# Patient Record
Sex: Male | Born: 1950
Health system: Southern US, Community
[De-identification: ages and names within clinical notes are randomized; demographics above are authoritative.]

## PROBLEM LIST (undated history)

## (undated) DIAGNOSIS — Z8489 Family history of other specified conditions: Secondary | ICD-10-CM

## (undated) DIAGNOSIS — R0600 Dyspnea, unspecified: Secondary | ICD-10-CM

## (undated) DIAGNOSIS — Z9889 Other specified postprocedural states: Secondary | ICD-10-CM

## (undated) DIAGNOSIS — F1090 Alcohol use, unspecified, uncomplicated: Secondary | ICD-10-CM

## (undated) DIAGNOSIS — G4730 Sleep apnea, unspecified: Secondary | ICD-10-CM

## (undated) DIAGNOSIS — J302 Other seasonal allergic rhinitis: Secondary | ICD-10-CM

## (undated) DIAGNOSIS — J45909 Unspecified asthma, uncomplicated: Secondary | ICD-10-CM

## (undated) DIAGNOSIS — M1990 Unspecified osteoarthritis, unspecified site: Secondary | ICD-10-CM

## (undated) DIAGNOSIS — K219 Gastro-esophageal reflux disease without esophagitis: Secondary | ICD-10-CM

## (undated) DIAGNOSIS — J449 Chronic obstructive pulmonary disease, unspecified: Secondary | ICD-10-CM

## (undated) DIAGNOSIS — F419 Anxiety disorder, unspecified: Secondary | ICD-10-CM

## (undated) DIAGNOSIS — Z789 Other specified health status: Secondary | ICD-10-CM

## (undated) DIAGNOSIS — R112 Nausea with vomiting, unspecified: Secondary | ICD-10-CM

## (undated) DIAGNOSIS — J42 Unspecified chronic bronchitis: Secondary | ICD-10-CM

## (undated) DIAGNOSIS — I10 Essential (primary) hypertension: Secondary | ICD-10-CM

## (undated) DIAGNOSIS — T7840XA Allergy, unspecified, initial encounter: Secondary | ICD-10-CM

## (undated) DIAGNOSIS — Z7289 Other problems related to lifestyle: Secondary | ICD-10-CM

## (undated) DIAGNOSIS — J189 Pneumonia, unspecified organism: Secondary | ICD-10-CM

## (undated) DIAGNOSIS — Z8719 Personal history of other diseases of the digestive system: Secondary | ICD-10-CM

## (undated) HISTORY — DX: Allergy, unspecified, initial encounter: T78.40XA

## (undated) HISTORY — PX: NASAL POLYP EXCISION: SHX2068

## (undated) HISTORY — PX: COLONOSCOPY: SHX174

## (undated) HISTORY — PX: TONSILLECTOMY: SUR1361

## (undated) HISTORY — PX: BACK SURGERY: SHX140

## (undated) HISTORY — PX: ANTERIOR CERVICAL DECOMP/DISCECTOMY FUSION: SHX1161

## (undated) HISTORY — PX: LUMBAR DISC SURGERY: SHX700

## (undated) HISTORY — PX: LAPAROSCOPIC CHOLECYSTECTOMY: SUR755

## (undated) HISTORY — PX: JOINT REPLACEMENT: SHX530

## (undated) HISTORY — PX: SPINE SURGERY: SHX786

---

## 1998-06-30 ENCOUNTER — Inpatient Hospital Stay (HOSPITAL_COMMUNITY): Admission: RE | Admit: 1998-06-30 | Discharge: 1998-07-01 | Payer: Self-pay | Admitting: Neurosurgery

## 2003-02-23 ENCOUNTER — Emergency Department (HOSPITAL_COMMUNITY): Admission: EM | Admit: 2003-02-23 | Discharge: 2003-02-23 | Payer: Self-pay | Admitting: Emergency Medicine

## 2004-09-14 ENCOUNTER — Ambulatory Visit (HOSPITAL_COMMUNITY): Admission: RE | Admit: 2004-09-14 | Discharge: 2004-09-14 | Payer: Self-pay | Admitting: General Surgery

## 2005-04-08 ENCOUNTER — Ambulatory Visit (HOSPITAL_COMMUNITY): Admission: RE | Admit: 2005-04-08 | Discharge: 2005-04-08 | Payer: Self-pay | Admitting: Internal Medicine

## 2007-03-27 ENCOUNTER — Ambulatory Visit (HOSPITAL_COMMUNITY): Admission: RE | Admit: 2007-03-27 | Discharge: 2007-03-27 | Payer: Self-pay | Admitting: Family Medicine

## 2009-12-22 ENCOUNTER — Ambulatory Visit (HOSPITAL_COMMUNITY): Admission: RE | Admit: 2009-12-22 | Discharge: 2009-12-22 | Payer: Self-pay | Admitting: Family Medicine

## 2011-04-19 NOTE — H&P (Signed)
Jeremy [name], HORSEY NO.:  [id number]   MEDICAL RECORD NO.:  [id number]           PATIENT TYPE:   LOCATION:                                 FACILITY:   PHYSICIAN:  Dalia Heading, M.D.  DATE OF BIRTH:  22-May-1951   DATE OF ADMISSION:  DATE OF DISCHARGE:  LH                                HISTORY & PHYSICAL   CHIEF COMPLAINT:  Need for screening colonoscopy, hematochezia.   HISTORY OF PRESENT ILLNESS:  The patient is a 60 year old white male who is  referred for a screening colonoscopy.  He denies any fevers, chills, weight  loss, abdominal pain, melena, constipation, or diarrhea.  He did have an  episode of hematochezia in the past.  There is no history of hemorrhoidal  disease.  He has never had a colonoscopy.  There is no family history of  colon carcinoma.   PAST MEDICAL HISTORY:  Hypertension.   PAST SURGICAL HISTORY:  1.  Back surgeries.  2.  Cholecystectomy.   CURRENT MEDICATIONS:  1.  Norvasc 10 mg p.o. daily.  2.  Toprol-XL 25 mg p.o. daily.  3.  Prilosec 20 mg p.o. daily.  4.  Cipro 500 mg p.o. b.i.d. x14 days.  5.  Sonata 10 mg p.o. q.h.s.   ALLERGIES:  PENICILLIN.   REVIEW OF SYSTEMS:  The patient does smoke.  He does drink alcohol on  occasion.   PHYSICAL EXAMINATION:  GENERAL:  The patient is a well-developed, well-  nourished white male in no acute distress.  VITAL SIGNS:  He is afebrile, and vital signs are stable.  LUNGS:  Clear to auscultation with equal breath sounds bilaterally.  HEART:  Regular rate and rhythm without S3, S4, or murmurs.  ABDOMEN:  Soft, nontender, nondistended.  No hepatosplenomegaly or masses  are noted.  Rectal examination was deferred to the procedure.   IMPRESSION:  Need for screening colonoscopy, hematochezia.   PLAN:  The patient is scheduled for a colonoscopy on September 14, 2004.  The  risks and benefits of the procedure, including bleeding and perforation,  were fully explained to the patient who  gave informed consent.       ___________________________________________  Dalia Heading, M.D.    MAJ/MEDQ  D:  08/22/2004  T:  08/22/2004  Job:  [id number]   cc:   Patrica Duel, M.D.  [address]  [place]  [place] [address]  Fax: [phone]

## 2014-07-06 ENCOUNTER — Other Ambulatory Visit (HOSPITAL_COMMUNITY): Payer: Self-pay | Admitting: Family Medicine

## 2014-07-06 ENCOUNTER — Ambulatory Visit (HOSPITAL_COMMUNITY)
Admission: RE | Admit: 2014-07-06 | Discharge: 2014-07-06 | Disposition: A | Discharge status: Home / Self Care | Payer: BC Managed Care – PPO | Source: Ambulatory Visit | Attending: Family Medicine | Admitting: Family Medicine

## 2014-07-06 DIAGNOSIS — M47812 Spondylosis without myelopathy or radiculopathy, cervical region: Secondary | ICD-10-CM | POA: Diagnosis not present

## 2014-07-06 DIAGNOSIS — M5030 Other cervical disc degeneration, unspecified cervical region: Secondary | ICD-10-CM

## 2014-07-06 DIAGNOSIS — M542 Cervicalgia: Secondary | ICD-10-CM | POA: Insufficient documentation

## 2014-07-06 DIAGNOSIS — R079 Chest pain, unspecified: Secondary | ICD-10-CM

## 2015-04-03 ENCOUNTER — Other Ambulatory Visit (HOSPITAL_COMMUNITY): Payer: Self-pay | Admitting: Family Medicine

## 2015-04-03 DIAGNOSIS — M5030 Other cervical disc degeneration, unspecified cervical region: Secondary | ICD-10-CM

## 2015-04-11 ENCOUNTER — Ambulatory Visit (HOSPITAL_COMMUNITY)
Admission: RE | Admit: 2015-04-11 | Discharge: 2015-04-11 | Disposition: A | Discharge status: Home / Self Care | Payer: Medicare Other | Source: Ambulatory Visit | Attending: Family Medicine | Admitting: Family Medicine

## 2015-04-11 DIAGNOSIS — M5030 Other cervical disc degeneration, unspecified cervical region: Secondary | ICD-10-CM

## 2015-12-12 DIAGNOSIS — J019 Acute sinusitis, unspecified: Secondary | ICD-10-CM | POA: Diagnosis not present

## 2015-12-12 DIAGNOSIS — I10 Essential (primary) hypertension: Secondary | ICD-10-CM | POA: Diagnosis not present

## 2015-12-12 DIAGNOSIS — Z6825 Body mass index (BMI) 25.0-25.9, adult: Secondary | ICD-10-CM | POA: Diagnosis not present

## 2015-12-12 DIAGNOSIS — Z1389 Encounter for screening for other disorder: Secondary | ICD-10-CM | POA: Diagnosis not present

## 2015-12-12 DIAGNOSIS — J209 Acute bronchitis, unspecified: Secondary | ICD-10-CM | POA: Diagnosis not present

## 2016-01-30 DIAGNOSIS — M5020 Other cervical disc displacement, unspecified cervical region: Secondary | ICD-10-CM | POA: Diagnosis not present

## 2016-04-23 DIAGNOSIS — M5030 Other cervical disc degeneration, unspecified cervical region: Secondary | ICD-10-CM | POA: Diagnosis not present

## 2016-04-23 DIAGNOSIS — Z0000 Encounter for general adult medical examination without abnormal findings: Secondary | ICD-10-CM | POA: Diagnosis not present

## 2016-04-23 DIAGNOSIS — Z6824 Body mass index (BMI) 24.0-24.9, adult: Secondary | ICD-10-CM | POA: Diagnosis not present

## 2016-04-23 DIAGNOSIS — Z1389 Encounter for screening for other disorder: Secondary | ICD-10-CM | POA: Diagnosis not present

## 2016-04-23 DIAGNOSIS — G894 Chronic pain syndrome: Secondary | ICD-10-CM | POA: Diagnosis not present

## 2016-04-23 DIAGNOSIS — I10 Essential (primary) hypertension: Secondary | ICD-10-CM | POA: Diagnosis not present

## 2016-04-24 DIAGNOSIS — E781 Pure hyperglyceridemia: Secondary | ICD-10-CM | POA: Diagnosis not present

## 2016-04-24 DIAGNOSIS — Z1389 Encounter for screening for other disorder: Secondary | ICD-10-CM | POA: Diagnosis not present

## 2016-04-24 DIAGNOSIS — Z6824 Body mass index (BMI) 24.0-24.9, adult: Secondary | ICD-10-CM | POA: Diagnosis not present

## 2016-04-24 DIAGNOSIS — I10 Essential (primary) hypertension: Secondary | ICD-10-CM | POA: Diagnosis not present

## 2016-04-24 DIAGNOSIS — Z0000 Encounter for general adult medical examination without abnormal findings: Secondary | ICD-10-CM | POA: Diagnosis not present

## 2016-04-25 DIAGNOSIS — D225 Melanocytic nevi of trunk: Secondary | ICD-10-CM | POA: Diagnosis not present

## 2016-04-25 DIAGNOSIS — D485 Neoplasm of uncertain behavior of skin: Secondary | ICD-10-CM | POA: Diagnosis not present

## 2016-04-25 DIAGNOSIS — L821 Other seborrheic keratosis: Secondary | ICD-10-CM | POA: Diagnosis not present

## 2016-04-25 DIAGNOSIS — D2261 Melanocytic nevi of right upper limb, including shoulder: Secondary | ICD-10-CM | POA: Diagnosis not present

## 2016-04-25 DIAGNOSIS — D2262 Melanocytic nevi of left upper limb, including shoulder: Secondary | ICD-10-CM | POA: Diagnosis not present

## 2016-04-30 DIAGNOSIS — M5020 Other cervical disc displacement, unspecified cervical region: Secondary | ICD-10-CM | POA: Diagnosis not present

## 2016-05-09 DIAGNOSIS — D485 Neoplasm of uncertain behavior of skin: Secondary | ICD-10-CM | POA: Diagnosis not present

## 2016-05-09 DIAGNOSIS — L089 Local infection of the skin and subcutaneous tissue, unspecified: Secondary | ICD-10-CM | POA: Diagnosis not present

## 2016-05-23 ENCOUNTER — Encounter (INDEPENDENT_AMBULATORY_CARE_PROVIDER_SITE_OTHER): Payer: Self-pay | Admitting: *Deleted

## 2016-08-01 DIAGNOSIS — M5020 Other cervical disc displacement, unspecified cervical region: Secondary | ICD-10-CM | POA: Diagnosis not present

## 2016-09-10 DIAGNOSIS — G894 Chronic pain syndrome: Secondary | ICD-10-CM | POA: Diagnosis not present

## 2016-09-10 DIAGNOSIS — J9801 Acute bronchospasm: Secondary | ICD-10-CM | POA: Diagnosis not present

## 2016-09-10 DIAGNOSIS — I10 Essential (primary) hypertension: Secondary | ICD-10-CM | POA: Diagnosis not present

## 2016-09-10 DIAGNOSIS — Z6824 Body mass index (BMI) 24.0-24.9, adult: Secondary | ICD-10-CM | POA: Diagnosis not present

## 2016-09-10 DIAGNOSIS — Z1389 Encounter for screening for other disorder: Secondary | ICD-10-CM | POA: Diagnosis not present

## 2016-09-13 DIAGNOSIS — R69 Illness, unspecified: Secondary | ICD-10-CM | POA: Diagnosis not present

## 2016-09-17 DIAGNOSIS — Z1211 Encounter for screening for malignant neoplasm of colon: Secondary | ICD-10-CM | POA: Diagnosis not present

## 2017-01-14 DIAGNOSIS — M5020 Other cervical disc displacement, unspecified cervical region: Secondary | ICD-10-CM | POA: Diagnosis not present

## 2017-05-27 DIAGNOSIS — Z0001 Encounter for general adult medical examination with abnormal findings: Secondary | ICD-10-CM | POA: Diagnosis not present

## 2017-05-27 DIAGNOSIS — G894 Chronic pain syndrome: Secondary | ICD-10-CM | POA: Diagnosis not present

## 2017-05-27 DIAGNOSIS — M1991 Primary osteoarthritis, unspecified site: Secondary | ICD-10-CM | POA: Diagnosis not present

## 2017-05-27 DIAGNOSIS — Z1389 Encounter for screening for other disorder: Secondary | ICD-10-CM | POA: Diagnosis not present

## 2017-05-27 DIAGNOSIS — Z6825 Body mass index (BMI) 25.0-25.9, adult: Secondary | ICD-10-CM | POA: Diagnosis not present

## 2017-05-27 DIAGNOSIS — E663 Overweight: Secondary | ICD-10-CM | POA: Diagnosis not present

## 2017-05-27 DIAGNOSIS — I10 Essential (primary) hypertension: Secondary | ICD-10-CM | POA: Diagnosis not present

## 2017-08-07 ENCOUNTER — Emergency Department (HOSPITAL_COMMUNITY)
Admission: EM | Admit: 2017-08-07 | Discharge: 2017-08-07 | Disposition: A | Discharge status: Home / Self Care | Payer: Medicare HMO | Attending: Emergency Medicine | Admitting: Emergency Medicine

## 2017-08-07 ENCOUNTER — Emergency Department (HOSPITAL_COMMUNITY): Payer: Medicare HMO

## 2017-08-07 ENCOUNTER — Encounter (HOSPITAL_COMMUNITY): Payer: Self-pay | Admitting: Emergency Medicine

## 2017-08-07 DIAGNOSIS — R1010 Upper abdominal pain, unspecified: Secondary | ICD-10-CM | POA: Diagnosis present

## 2017-08-07 DIAGNOSIS — F17290 Nicotine dependence, other tobacco product, uncomplicated: Secondary | ICD-10-CM | POA: Diagnosis not present

## 2017-08-07 DIAGNOSIS — R0602 Shortness of breath: Secondary | ICD-10-CM | POA: Diagnosis not present

## 2017-08-07 DIAGNOSIS — R109 Unspecified abdominal pain: Secondary | ICD-10-CM | POA: Diagnosis not present

## 2017-08-07 DIAGNOSIS — R1011 Right upper quadrant pain: Secondary | ICD-10-CM | POA: Diagnosis not present

## 2017-08-07 DIAGNOSIS — K2970 Gastritis, unspecified, without bleeding: Secondary | ICD-10-CM

## 2017-08-07 DIAGNOSIS — E876 Hypokalemia: Secondary | ICD-10-CM | POA: Diagnosis not present

## 2017-08-07 DIAGNOSIS — R69 Illness, unspecified: Secondary | ICD-10-CM | POA: Diagnosis not present

## 2017-08-07 DIAGNOSIS — R05 Cough: Secondary | ICD-10-CM | POA: Diagnosis not present

## 2017-08-07 HISTORY — DX: Other specified health status: Z78.9

## 2017-08-07 HISTORY — DX: Alcohol use, unspecified, uncomplicated: F10.90

## 2017-08-07 HISTORY — DX: Other problems related to lifestyle: Z72.89

## 2017-08-07 LAB — COMPREHENSIVE METABOLIC PANEL
ALT: 20 U/L (ref 17–63)
ANION GAP: 13 (ref 5–15)
AST: 23 U/L (ref 15–41)
Albumin: 5 g/dL (ref 3.5–5.0)
Alkaline Phosphatase: 79 U/L (ref 38–126)
BUN: 6 mg/dL (ref 6–20)
CHLORIDE: 94 mmol/L — AB (ref 101–111)
CO2: 29 mmol/L (ref 22–32)
Calcium: 10 mg/dL (ref 8.9–10.3)
Creatinine, Ser: 0.69 mg/dL (ref 0.61–1.24)
GFR calc non Af Amer: >60 mL/min (ref >60)
Glucose, Bld: 99 mg/dL (ref 65–99)
POTASSIUM: 2.5 mmol/L — AB (ref 3.5–5.1)
SODIUM: 136 mmol/L (ref 135–145)
Total Bilirubin: 1.7 mg/dL — ABNORMAL HIGH (ref 0.3–1.2)
Total Protein: 8 g/dL (ref 6.5–8.1)

## 2017-08-07 LAB — DIFFERENTIAL
BASOS ABS: 0.1 10*3/uL (ref 0.0–0.1)
Basophils Relative: 1 %
EOS PCT: 2 %
Eosinophils Absolute: 0.2 10*3/uL (ref 0.0–0.7)
LYMPHS ABS: 1.9 10*3/uL (ref 0.7–4.0)
LYMPHS PCT: 18 %
MONOS PCT: 14 %
Monocytes Absolute: 1.5 10*3/uL — ABNORMAL HIGH (ref 0.1–1.0)
NEUTROS PCT: 65 %
Neutro Abs: 7.1 10*3/uL (ref 1.7–7.7)

## 2017-08-07 LAB — URINALYSIS, ROUTINE W REFLEX MICROSCOPIC
BILIRUBIN URINE: NEGATIVE
Bacteria, UA: NONE SEEN
GLUCOSE, UA: NEGATIVE mg/dL
Ketones, ur: 5 mg/dL — AB
Leukocytes, UA: NEGATIVE
Nitrite: NEGATIVE
PH: 7 (ref 5.0–8.0)
Protein, ur: NEGATIVE mg/dL
SPECIFIC GRAVITY, URINE: 1.003 — AB (ref 1.005–1.030)
SQUAMOUS EPITHELIAL / LPF: NONE SEEN

## 2017-08-07 LAB — ETHANOL: Alcohol, Ethyl (B): <5 mg/dL (ref <5)

## 2017-08-07 LAB — CBC
HCT: 51.2 % (ref 39.0–52.0)
HEMOGLOBIN: 18.7 g/dL — AB (ref 13.0–17.0)
MCH: 33.6 pg (ref 26.0–34.0)
MCHC: 36.5 g/dL — ABNORMAL HIGH (ref 30.0–36.0)
MCV: 91.9 fL (ref 78.0–100.0)
Platelets: 308 10*3/uL (ref 150–400)
RBC: 5.57 MIL/uL (ref 4.22–5.81)
RDW: 12.2 % (ref 11.5–15.5)
WBC: 11.1 10*3/uL — ABNORMAL HIGH (ref 4.0–10.5)

## 2017-08-07 LAB — LIPASE, BLOOD: LIPASE: 38 U/L (ref 11–51)

## 2017-08-07 LAB — TROPONIN I: Troponin I: <0.03 ng/mL (ref <0.03)

## 2017-08-07 MED ORDER — POTASSIUM CHLORIDE 20 MEQ/15ML (10%) PO SOLN
40.0000 meq | Freq: Once | ORAL | Status: AC
  Administered 2017-08-07: 40 meq via ORAL
  Filled 2017-08-07: qty 30

## 2017-08-07 MED ORDER — POTASSIUM CHLORIDE 10 MEQ/100ML IV SOLN
10.0000 meq | Freq: Once | INTRAVENOUS | Status: AC
  Administered 2017-08-07: 10 meq via INTRAVENOUS
  Filled 2017-08-07: qty 100

## 2017-08-07 MED ORDER — IOPAMIDOL (ISOVUE-300) INJECTION 61%
100.0000 mL | Freq: Once | INTRAVENOUS | Status: AC | PRN
  Administered 2017-08-07: 100 mL via INTRAVENOUS

## 2017-08-07 MED ORDER — PANTOPRAZOLE SODIUM 40 MG IV SOLR
40.0000 mg | Freq: Once | INTRAVENOUS | Status: AC
  Administered 2017-08-07: 40 mg via INTRAVENOUS
  Filled 2017-08-07: qty 40

## 2017-08-07 MED ORDER — PANTOPRAZOLE SODIUM 20 MG PO TBEC: 20.0000 mg | DELAYED_RELEASE_TABLET | Freq: Every day | ORAL | 0 refills | Status: DC

## 2017-08-07 MED ORDER — ONDANSETRON HCL 4 MG/2ML IJ SOLN
4.0000 mg | INTRAMUSCULAR | Status: DC | PRN
  Administered 2017-08-07: 4 mg via INTRAVENOUS
  Filled 2017-08-07: qty 2

## 2017-08-07 MED ORDER — FAMOTIDINE 20 MG PO TABS: 20.0000 mg | ORAL_TABLET | Freq: Two times a day (BID) | ORAL | 0 refills | Status: DC

## 2017-08-07 MED ORDER — ONDANSETRON 4 MG PO TBDP: 4.0000 mg | ORAL_TABLET | Freq: Three times a day (TID) | ORAL | 0 refills | Status: DC | PRN

## 2017-08-07 MED ORDER — MORPHINE SULFATE (PF) 4 MG/ML IV SOLN
4.0000 mg | INTRAVENOUS | Status: DC | PRN
  Administered 2017-08-07: 4 mg via INTRAVENOUS
  Filled 2017-08-07: qty 1

## 2017-08-07 MED ORDER — POTASSIUM CHLORIDE CRYS ER 20 MEQ PO TBCR: 20.0000 meq | EXTENDED_RELEASE_TABLET | Freq: Two times a day (BID) | ORAL | 0 refills | Status: DC

## 2017-08-07 MED ORDER — POTASSIUM CHLORIDE CRYS ER 20 MEQ PO TBCR: 40.0000 meq | EXTENDED_RELEASE_TABLET | Freq: Once | ORAL | Status: DC

## 2017-08-07 NOTE — ED Provider Notes (Signed)
[facility] DEPT Provider Note   CSN: [id number] Arrival date & time: 08/07/17  1731     History   Chief Complaint Chief Complaint  Patient presents with  . Abdominal Pain    HPI Jeremy [name] is a 66 y.o. male.  HPI  Pt was seen at [facility].  Per pt's family and pt, c/o gradual onset and persistence of constant upper abd "pain" since yesterday.  Has been associated with radiation into his back.  Describes the abd pain as "bloating."  Pt states he has had this abd pain previously, but "it's never lasted this long." Endorses daily etoh use.  Denies N/V, no diarrhea, no fevers, no back pain, no rash, no CP/SOB, no black or blood in stools.     GI: Dr. Laural Golden Past Medical History:  Diagnosis Date  . Alcohol use    daily    There are no active problems to display for this patient.   Past Surgical History:  Procedure Laterality Date  . BACK SURGERY    . CHOLECYSTECTOMY    . NECK SURGERY         Home Medications    Prior to Admission medications   Not on File    Family History History reviewed. No pertinent family history.  Social History Social History  Substance Use Topics  . Smoking status: Current Every Day Smoker    Types: Pipe  . Smokeless tobacco: Never Used  . Alcohol use Yes     Comment: daily     Allergies   Penicillins   Review of Systems Review of Systems ROS: Statement: All systems negative except as marked or noted in the HPI; Constitutional: Negative for fever and chills. ; ; Eyes: Negative for eye pain, redness and discharge. ; ; ENMT: Negative for ear pain, hoarseness, nasal congestion, sinus pressure and sore throat. ; ; Cardiovascular: Negative for chest pain, palpitations, diaphoresis, dyspnea and peripheral edema. ; ; Respiratory: Negative for cough, wheezing and stridor. ; ; Gastrointestinal: +abd pain. Negative for nausea, vomiting, diarrhea, blood in stool, hematemesis, jaundice and rectal bleeding. . ; ; Genitourinary: Negative  for dysuria, flank pain and hematuria. ; ; Musculoskeletal: Negative for back pain and neck pain. Negative for swelling and trauma.; ; Skin: Negative for pruritus, rash, abrasions, blisters, bruising and skin lesion.; ; Neuro: Negative for headache, lightheadedness and neck stiffness. Negative for weakness, altered level of consciousness, altered mental status, extremity weakness, paresthesias, involuntary movement, seizure and syncope.       Physical Exam Updated Vital Signs Ht 5\' 11"  (1.803 m)   Wt 79.4 kg (175 lb)   BMI 24.41 kg/m   Physical Exam 2000: Physical examination:  Nursing notes reviewed; Vital signs and O2 SAT reviewed;  Constitutional: Well developed, Well nourished, Well hydrated, In no acute distress; Head:  Normocephalic, atraumatic; Eyes: EOMI, PERRL, No scleral icterus; ENMT: Mouth and pharynx normal, Mucous membranes moist; Neck: Supple, Full range of motion, No lymphadenopathy; Cardiovascular: Regular rate and rhythm, No gallop; Respiratory: Breath sounds clear & equal bilaterally, No wheezes.  Speaking full sentences with ease, Normal respiratory effort/excursion; Chest: Nontender, Movement normal; Abdomen: Soft, +diffuse tenderness to palp. No rebound or guarding. Nondistended, Normal bowel sounds; Genitourinary: No CVA tenderness; Extremities: Pulses normal, No tenderness, No edema, No calf edema or asymmetry. No hand tremor..; Neuro: AA&Ox3, Major CN grossly intact.  Speech clear. No gross focal motor or sensory deficits in extremities.; Skin: Color normal, Warm, Dry.   ED Treatments / Results  Labs (  all labs ordered are listed, but only abnormal results are displayed)   EKG  EKG Interpretation  Date/Time:  Thursday August 07 2017 17:44:03 EDT Ventricular Rate:  96 PR Interval:  166 QRS Duration: 94 QT Interval:  372 QTC Calculation: 469 R Axis:   42 Text Interpretation:  Normal sinus rhythm with sinus arrhythmia Nonspecific ST abnormality No old tracing  to compare Confirmed by Francine Graven [phone]) on 08/07/2017 8:16:27 PM       Radiology   Procedures Procedures (including critical care time)  Medications Ordered in ED Medications  ondansetron (ZOFRAN) injection 4 mg (4 mg Intravenous Given 08/07/17 2026)  morphine 4 MG/ML injection 4 mg (4 mg Intravenous Given 08/07/17 2026)  potassium chloride 10 mEq in 100 mL IVPB (not administered)  pantoprazole (PROTONIX) injection 40 mg (40 mg Intravenous Given 08/07/17 2026)     Initial Impression / Assessment and Plan / ED Course  I have reviewed the triage vital signs and the nursing notes.  Pertinent labs & imaging results that were available during my care of the patient were reviewed by me and considered in my medical decision making (see chart for details).  MDM Reviewed: nursing note and vitals Interpretation: labs, ECG, x-ray and CT scan   Results for orders placed or performed during the hospital encounter of 08/07/17  Lipase, blood  Result Value Ref Range   Lipase 38 11 - 51 U/L  Comprehensive metabolic panel  Result Value Ref Range   Sodium 136 135 - 145 mmol/L   Potassium 2.5 (LL) 3.5 - 5.1 mmol/L   Chloride 94 (L) 101 - 111 mmol/L   CO2 29 22 - 32 mmol/L   Glucose, Bld 99 65 - 99 mg/dL   BUN 6 6 - 20 mg/dL   Creatinine, Ser 0.69 0.61 - 1.24 mg/dL   Calcium 10.0 8.9 - 10.3 mg/dL   Total Protein 8.0 6.5 - 8.1 g/dL   Albumin 5.0 3.5 - 5.0 g/dL   AST 23 15 - 41 U/L   ALT 20 17 - 63 U/L   Alkaline Phosphatase 79 38 - 126 U/L   Total Bilirubin 1.7 (H) 0.3 - 1.2 mg/dL   GFR calc non Af Amer >60 >60 mL/min   GFR calc Af Amer >60 >60 mL/min   Anion gap 13 5 - 15  CBC  Result Value Ref Range   WBC 11.1 (H) 4.0 - 10.5 K/uL   RBC 5.57 4.22 - 5.81 MIL/uL   Hemoglobin 18.7 (H) 13.0 - 17.0 g/dL   HCT 51.2 39.0 - 52.0 %   MCV 91.9 78.0 - 100.0 fL   MCH 33.6 26.0 - 34.0 pg   MCHC 36.5 (H) 30.0 - 36.0 g/dL   RDW 12.2 11.5 - 15.5 %   Platelets 308 150 - 400 K/uL    Urinalysis, Routine w reflex microscopic  Result Value Ref Range   Color, Urine STRAW (A) YELLOW   APPearance CLEAR CLEAR   Specific Gravity, Urine 1.003 (L) 1.005 - 1.030   pH 7.0 5.0 - 8.0   Glucose, UA NEGATIVE NEGATIVE mg/dL   Hgb urine dipstick SMALL (A) NEGATIVE   Bilirubin Urine NEGATIVE NEGATIVE   Ketones, ur 5 (A) NEGATIVE mg/dL   Protein, ur NEGATIVE NEGATIVE mg/dL   Nitrite NEGATIVE NEGATIVE   Leukocytes, UA NEGATIVE NEGATIVE   RBC / HPF 0-5 0 - 5 RBC/hpf   WBC, UA 0-5 0 - 5 WBC/hpf   Bacteria, UA NONE SEEN NONE SEEN  Squamous Epithelial / LPF NONE SEEN NONE SEEN  Troponin I  Result Value Ref Range   Troponin I <0.03 <0.03 ng/mL  Differential  Result Value Ref Range   Neutrophils Relative % 65 %   Neutro Abs 7.1 1.7 - 7.7 K/uL   Lymphocytes Relative 18 %   Lymphs Abs 1.9 0.7 - 4.0 K/uL   Monocytes Relative 14 %   Monocytes Absolute 1.5 (H) 0.1 - 1.0 K/uL   Eosinophils Relative 2 %   Eosinophils Absolute 0.2 0.0 - 0.7 K/uL   Basophils Relative 1 %   Basophils Absolute 0.1 0.0 - 0.1 K/uL  Ethanol  Result Value Ref Range   Alcohol, Ethyl (B) <5 <5 mg/dL   Dg Chest 2 View Result Date: 08/07/2017 CLINICAL DATA:  Mild shortness of breath and productive cough. EXAM: CHEST  2 VIEW COMPARISON:  07/06/2014 FINDINGS: Lungs are hyperexpanded. Interstitial markings are diffusely coarsened with chronic features. The lungs are clear without focal pneumonia, edema, pneumothorax or pleural effusion. The cardiopericardial silhouette is within normal limits for size. The visualized bony structures of the thorax are intact. IMPRESSION: Stable exam.  Emphysema without acute cardiopulmonary findings. Electronically Signed   By: Misty Stanley M.D.   On: 08/07/2017 20:59   Ct Abdomen Pelvis W Contrast Result Date: 08/07/2017 CLINICAL DATA:  Patient reports bilateral upper quadrant abdominal pain with "bloating sensation" that radiates to thoracic back. Patient denies any injury. EXAM: CT  ABDOMEN AND PELVIS WITH CONTRAST TECHNIQUE: Multidetector CT imaging of the abdomen and pelvis was performed using the standard protocol following bolus administration of intravenous contrast. CONTRAST:  154mL ISOVUE-300 IOPAMIDOL (ISOVUE-300) INJECTION 61% COMPARISON:  None. FINDINGS: Lower chest: No acute abnormality. Hepatobiliary: No focal liver lesions identified. Surgical absence of the gallbladder. There is mild intra and extrahepatic bile duct dilatation likely representing normal postoperative changes. Correlation with liver function studies recommended. Pancreas: Unremarkable. No pancreatic ductal dilatation or surrounding inflammatory changes. Spleen: Normal in size without focal abnormality. Adrenals/Urinary Tract: No adrenal gland nodules. Subcentimeter cysts in the kidneys. No hydronephrosis or hydroureter. Punctate calcification in the left renal hilum looks to represent a vascular calcification. No bladder wall thickening or filling defect. Stomach/Bowel: Stomach is decompressed. Gastric wall appears somewhat asymmetrically thickened. This may just be due to decompression but gastritis is not excluded. No small bowel distention or wall thickening appreciated. Colon is mostly decompressed with scattered stool present. No inflammatory changes. Appendix is not identified. Vascular/Lymphatic: Prominent aortic calcifications. Luminal narrowing of the distal abdominal aorta. Vessels remain patent. No significant lymphadenopathy. Reproductive: Prostate is unremarkable. Other: No abdominal wall hernia or abnormality. No abdominopelvic ascites. Musculoskeletal: Degenerative changes in the lumbar spine and hips. Geographic areas of sclerosis in both femoral heads likely representing avascular necrosis. IMPRESSION: 1. Possible asymmetric gastric wall thickening may indicate gastritis. 2. Mild intra and extrahepatic bile duct dilatation likely normal postoperative changes after cholecystectomy. 3. Prominent  aortic atherosclerosis with some luminal narrowing of the distal abdominal aorta. 4. Probable avascular necrosis in both femoral heads. Electronically Signed   By: Lucienne Capers M.D.   On: 08/07/2017 21:42    2215:  Potassium repleted IV and PO. Pt has tol PO well while in the ED without N/V.  No stooling while in the ED.  Abd benign, VSS. Feels better and wants to go home now. Tx symptomatically, f/u GI MD. No s/s etoh withdrawal at this time. Dx and testing d/w pt and family.  Questions answered.  Verb understanding, agreeable to d/c  home with outpt f/u.      Final Clinical Impressions(s) / ED Diagnoses   Final diagnoses:  Abdominal pain, unspecified abdominal location  Hypokalemia    New Prescriptions New Prescriptions   No medications on file     Francine Graven, DO 08/11/17 1539

## 2017-08-07 NOTE — ED Notes (Signed)
Patient transported to CT 

## 2017-08-07 NOTE — ED Notes (Signed)
Pt tolerating water at [facility]

## 2017-08-07 NOTE — ED Triage Notes (Signed)
Pt reports bilateral upper quadrant abd pain with "bloating sensation" that radiates to thoracic back. Pt denies any injury. LBM yesterday.

## 2017-08-07 NOTE — ED Notes (Signed)
Pt's wife states that pt drink a half gallon of liquor in 4 days.

## 2017-08-07 NOTE — Discharge Instructions (Addendum)
Eat a bland diet, avoiding greasy, fatty, fried foods, as well as spicy and acidic foods or beverages.  Try to avoid aspirin and ibuprofen products. Avoid eating within the hour or 2 before going to bed or laying down.  Also avoid teas, colas, coffee, chocolate, pepermint and spearment. Take the prescriptions as directed. May also take over the counter maalox/mylanta, as directed on packaging, as needed for discomfort. Your potassium level was low today, and you were given potassium supplements. Your potassium level will need to be rechecked by either your family doctor or your GI doctor within the next week. Call your regular medical doctor tomorrow to schedule a follow up appointment this week. Call your GI doctor tomorrow to schedule a follow up appointment within the next week. Return to the Emergency Department immediately if worsening.

## 2017-08-07 NOTE — ED Notes (Signed)
Date and time results received: 08/07/17 2042 (use smartphrase ".now" to insert current time)  Test: Potassium Critical Value: 2.5  Name of Provider Notified: Thurnell Garbe  Orders Received? Or Actions Taken?

## 2017-08-07 NOTE — ED Notes (Signed)
EKG given to Dr. McManus.  

## 2017-08-20 DIAGNOSIS — R1110 Vomiting, unspecified: Secondary | ICD-10-CM | POA: Diagnosis not present

## 2017-08-20 DIAGNOSIS — R100 Acute abdomen: Secondary | ICD-10-CM | POA: Diagnosis not present

## 2017-08-20 DIAGNOSIS — Z23 Encounter for immunization: Secondary | ICD-10-CM | POA: Diagnosis not present

## 2017-08-20 DIAGNOSIS — Z719 Counseling, unspecified: Secondary | ICD-10-CM | POA: Diagnosis not present

## 2017-08-20 DIAGNOSIS — Z6824 Body mass index (BMI) 24.0-24.9, adult: Secondary | ICD-10-CM | POA: Diagnosis not present

## 2018-02-26 DIAGNOSIS — Z1389 Encounter for screening for other disorder: Secondary | ICD-10-CM | POA: Diagnosis not present

## 2018-02-26 DIAGNOSIS — I10 Essential (primary) hypertension: Secondary | ICD-10-CM | POA: Diagnosis not present

## 2018-02-26 DIAGNOSIS — M1991 Primary osteoarthritis, unspecified site: Secondary | ICD-10-CM | POA: Diagnosis not present

## 2018-02-26 DIAGNOSIS — Z6826 Body mass index (BMI) 26.0-26.9, adult: Secondary | ICD-10-CM | POA: Diagnosis not present

## 2018-02-26 DIAGNOSIS — J329 Chronic sinusitis, unspecified: Secondary | ICD-10-CM | POA: Diagnosis not present

## 2018-02-27 ENCOUNTER — Ambulatory Visit (HOSPITAL_COMMUNITY)
Admission: RE | Admit: 2018-02-27 | Discharge: 2018-02-27 | Disposition: A | Discharge status: Home / Self Care | Payer: Medicare HMO | Source: Ambulatory Visit | Attending: Internal Medicine | Admitting: Internal Medicine

## 2018-02-27 ENCOUNTER — Other Ambulatory Visit (HOSPITAL_COMMUNITY): Payer: Self-pay | Admitting: Internal Medicine

## 2018-02-27 DIAGNOSIS — M25552 Pain in left hip: Principal | ICD-10-CM

## 2018-02-27 DIAGNOSIS — M87852 Other osteonecrosis, left femur: Secondary | ICD-10-CM | POA: Diagnosis not present

## 2018-02-27 DIAGNOSIS — M25551 Pain in right hip: Secondary | ICD-10-CM | POA: Insufficient documentation

## 2018-02-27 DIAGNOSIS — M1612 Unilateral primary osteoarthritis, left hip: Secondary | ICD-10-CM | POA: Diagnosis not present

## 2018-02-27 DIAGNOSIS — M87851 Other osteonecrosis, right femur: Secondary | ICD-10-CM | POA: Diagnosis not present

## 2018-02-27 DIAGNOSIS — M167 Other unilateral secondary osteoarthritis of hip: Secondary | ICD-10-CM | POA: Diagnosis not present

## 2018-03-09 DIAGNOSIS — M25552 Pain in left hip: Secondary | ICD-10-CM | POA: Diagnosis not present

## 2018-03-09 DIAGNOSIS — M25551 Pain in right hip: Secondary | ICD-10-CM | POA: Diagnosis not present

## 2018-03-09 DIAGNOSIS — M545 Low back pain: Secondary | ICD-10-CM | POA: Diagnosis not present

## 2018-03-30 DIAGNOSIS — I10 Essential (primary) hypertension: Secondary | ICD-10-CM | POA: Diagnosis present

## 2018-03-30 DIAGNOSIS — Z789 Other specified health status: Secondary | ICD-10-CM | POA: Diagnosis present

## 2018-03-30 DIAGNOSIS — M1612 Unilateral primary osteoarthritis, left hip: Secondary | ICD-10-CM | POA: Diagnosis present

## 2018-03-30 DIAGNOSIS — M1712 Unilateral primary osteoarthritis, left knee: Secondary | ICD-10-CM | POA: Diagnosis not present

## 2018-03-30 DIAGNOSIS — Z7289 Other problems related to lifestyle: Secondary | ICD-10-CM | POA: Diagnosis present

## 2018-03-30 DIAGNOSIS — Z720 Tobacco use: Secondary | ICD-10-CM | POA: Diagnosis present

## 2018-03-30 DIAGNOSIS — Z9889 Other specified postprocedural states: Secondary | ICD-10-CM

## 2018-03-30 NOTE — H&P (Signed)
HIP ARTHROPLASTY ADMISSION H&P  Patient ID: AZURE BARRALES MRN: [id number] DOB/AGE: [birth year] 67 y.o.  Chief Complaint: left hip pain.  Planned Procedure Date: 04/21/2018 Medical and cardiac clearance by Dr. Gerarda Fraction  HPI: Jeremy [name] is a 67 y.o. male smoker with a history of hypertension, lumbar and cervical back surgeries and daily EtOH use who presents for evaluation of OA LEFT HIP. The patient has a history of pain and functional disability in the left hip due to arthritis and has failed non-surgical conservative treatments for greater than 12 weeks to include NSAID's and/or analgesics and activity modification.  Onset of symptoms was gradual, starting 1 years ago with gradually worsening course since that time.  Patient currently rates pain at 8 out of 10 with activity. Patient has night pain, worsening of pain with activity and weight bearing and pain that interferes with activities of daily living.  Patient has evidence of subchondral cysts, subchondral sclerosis, periarticular osteophytes and joint space narrowing by imaging studies.  There is no active infection.  Past Medical History:  Diagnosis Date  . Alcohol use    daily   Past Surgical History:  Procedure Laterality Date  . BACK SURGERY    . CHOLECYSTECTOMY    . NECK SURGERY     Allergies  Allergen Reactions  . Penicillins     .Has patient had a PCN reaction causing immediate rash, facial/tongue/throat swelling, SOB or lightheadedness with hypotension: Yes Has patient had a PCN reaction causing severe rash involving mucus membranes or skin necrosis: No Has patient had a PCN reaction that required hospitalization: No Has patient had a PCN reaction occurring within the last 10 years: No If all of the above answers are "NO", then may proceed with Cephalosporin use.    Medications: Amlodipine 10 mg daily in the morning Lisinopril 40 mg daily in the morning Diltiazem 240 mg daily in the morning Omeprazole 20 mg  daily at night Fluticasone 50 mcg 1 daily in the evening Meloxicam 7.5 mg daily Ventolin inhaler 2 puffs as needed shortness of breath/wheezing  Social History: Married, retired, everyday smoker.  One pack per day x44 years.  Every day EtOH use - 5 to 6 whiskey drinks per day.  Family history: Mother with heart disease, HTN, CVA.  Father with heart disease, MI, cancer.  Brother with HTN, CVA.  ROS: Currently denies lightheadedness, dizziness, Fever, chills, CP.   No personal history of DVT, PE, MI, or CVA. No loose teeth or dentures. All other systems have been reviewed and were otherwise currently negative with the exception of those mentioned in the HPI and as above.  Objective: Vitals: Ht: 5 feet 10 inches wt: 177.8 temp: 98.0 BP: 176/91 pulse: 81 O2 96 % on room air.   Physical Exam: General: Alert, NAD. Trendelenberg Gait  HEENT: EOMI, Reduced Neck Extension  Pulm: No increased work of breathing.  Decreased inspiratory volume.  Clear B/L A/P w/o crackle or wheeze.  CV: RRR, No m/g/r appreciated  GI: soft, NT, ND Neuro: Neuro without gross focal deficit.  Sensation intact distally Skin: No lesions in the area of chief complaint MSK/Surgical Site: Left hip pain with passive ROM.  Positive Stinchfield.  5/5 strength.  NVI.  Sensation intact distally.  Imaging Review Plain radiographs demonstrate severe degenerative joint disease of the left hip.   Preoperative templating of the joint replacement has been completed, documented, and submitted to the Operating Room personnel in order to optimize intra-operative equipment management.  Assessment: OA LEFT  HIP Principal Problem:   Primary osteoarthritis of left hip Active Problems:   Alcohol use   Essential hypertension   History of lumbar surgery   History of cervical spinal surgery   Tobacco abuse disorder   Plan: Plan for Procedure(s): TOTAL HIP ARTHROPLASTY ANTERIOR APPROACH  The patient history, physical exam,  clinical judgement of the provider and imaging are consistent with end stage degenerative joint disease and total joint arthroplasty is deemed medically necessary. The treatment options including medical management, injection therapy, and arthroplasty were discussed at length. The risks and benefits of Procedure(s): TOTAL HIP ARTHROPLASTY ANTERIOR APPROACH were presented and reviewed.  The risks of nonoperative treatment, versus surgical intervention including but not limited to continued pain, aseptic loosening, stiffness, dislocation/subluxation, infection, bleeding, nerve injury, blood clots, cardiopulmonary complications, morbidity, mortality, among others were discussed. The patient verbalizes understanding and wishes to proceed with the plan.  Patient is being admitted for inpatient treatment for surgery, pain control, PT, OT, prophylactic antibiotics, VTE prophylaxis, progressive ambulation, ADL's and discharge planning.   Dental prophylaxis discussed and recommended for 2 years postoperatively.   The patient does meet the criteria for TXA which will be used perioperatively.    Consider CIWA protocol dt regular EtOH use history.  ASA 325 mg will be used postoperatively for DVT prophylaxis in addition to SCDs, and early ambulation.  PCN Allergy remote.  Has tolerated Amoxicillin and other PCNs.  Plan for Ancef for surgical prophylaxis.  Nasal swabs pending.  Continue meloxicam for inflammation.  Continue omeprazole for gastric protection.  The patient is planning to be discharged home with home health services ([facility]) in care of his wife.   Prudencio Burly III, PA-C 03/30/2018 4:00 PM

## 2018-04-09 NOTE — Pre-Procedure Instructions (Signed)
Jeremy [name]  04/09/2018    Your procedure is scheduled on Tuesday, Apr 21, 2018 at 7:30 AM.   Report to [facility] Entrance "A" Admitting Office at 5:30 AM.   Call this number if you have problems the morning of surgery: [phone]   Questions prior to day of surgery, please call [phone] between 8 & 4 PM.   Remember:  Do not eat food or drink liquids after midnight Monday, 04/20/18.  Take these medicines the morning of surgery with A SIP OF WATER: Amlodipine (Norvasc), Diltiazem (Cardiaem), Omeprazole (Prilosec), Flonase nasal spray, Ventolin inhaler - if needed (bring inhaler with you day of surgery)  Stop NSAIDS (Meloxicam, Ibuprofen, Aleve, etc) 7 days prior to surgery. Do not use Aspirin products 7 days prior to surgery.  Do NOT smoke 24 hours prior to surgery.   Do not wear jewelry.  Do not wear lotions, powders, cologne or deodorant.  Men may shave face and neck.  Do not bring valuables to the hospital.  [facility] is not responsible for any belongings or valuables.  Contacts, dentures or bridgework may not be worn into surgery.  Leave your suitcase in the car.  After surgery it may be brought to your room.  For patients admitted to the hospital, discharge time will be determined by your treatment team.  [facility] - Preparing for Surgery  Before surgery, you can play an important role.  Because skin is not sterile, your skin needs to be as free of germs as possible.  You can reduce the number of germs on you skin by washing with CHG (chlorahexidine gluconate) soap before surgery.  CHG is an antiseptic cleaner which kills germs and bonds with the skin to continue killing germs even after washing.  Please DO NOT use if you have an allergy to CHG or antibacterial soaps.  If your skin becomes reddened/irritated stop using the CHG and inform your nurse when you arrive at Short Stay.  Do not shave (including legs and underarms) for at least 48 hours prior  to the first CHG shower.  You may shave your face.  Please follow these instructions carefully:   1.  Shower with CHG Soap the night before surgery and the                    morning of Surgery.  2.  If you choose to wash your hair, wash your hair first as usual with your       normal shampoo.  3.  After you shampoo, rinse your hair and body thoroughly to remove the shampoo.  4.  Use CHG as you would any other liquid soap.  You can apply chg directly       to the skin and wash gently with scrungie or a clean washcloth.  5.  Apply the CHG Soap to your body ONLY FROM THE NECK DOWN.        Do not use on open wounds or open sores.  Avoid contact with your eyes, ears, mouth and genitals (private parts).  Wash genitals (private parts) with your normal soap.  6.  Wash thoroughly, paying special attention to the area where your surgery        will be performed.  7.  Thoroughly rinse your body with warm water from the neck down.  8.  DO NOT shower/wash with your normal soap after using and rinsing off       the CHG Soap.  9.  Pat yourself dry with a clean towel.            10.  Wear clean pajamas.            11.  Place clean sheets on your bed the night of your first shower and do not        sleep with pets.  Day of Surgery  Shower as above. Do not apply any lotions/deodorants the morning of surgery.  Please wear clean clothes to the hospital.   Please read over the fact sheets that you were given.

## 2018-04-10 ENCOUNTER — Encounter (HOSPITAL_COMMUNITY)
Admission: RE | Admit: 2018-04-10 | Discharge: 2018-04-10 | Disposition: A | Discharge status: Home / Self Care | Payer: Medicare HMO | Source: Ambulatory Visit | Attending: Orthopedic Surgery | Admitting: Orthopedic Surgery

## 2018-04-10 ENCOUNTER — Other Ambulatory Visit: Payer: Self-pay

## 2018-04-10 ENCOUNTER — Encounter (HOSPITAL_COMMUNITY): Payer: Self-pay

## 2018-04-10 DIAGNOSIS — Z01812 Encounter for preprocedural laboratory examination: Secondary | ICD-10-CM | POA: Diagnosis not present

## 2018-04-10 HISTORY — DX: Other specified postprocedural states: R11.2

## 2018-04-10 HISTORY — DX: Unspecified osteoarthritis, unspecified site: M19.90

## 2018-04-10 HISTORY — DX: Dyspnea, unspecified: R06.00

## 2018-04-10 HISTORY — DX: Anxiety disorder, unspecified: F41.9

## 2018-04-10 HISTORY — DX: Gastro-esophageal reflux disease without esophagitis: K21.9

## 2018-04-10 HISTORY — DX: Essential (primary) hypertension: I10

## 2018-04-10 HISTORY — DX: Other specified postprocedural states: Z98.890

## 2018-04-10 HISTORY — DX: Personal history of other diseases of the digestive system: Z87.19

## 2018-04-10 HISTORY — DX: Pneumonia, unspecified organism: J18.9

## 2018-04-10 LAB — COMPREHENSIVE METABOLIC PANEL
ALBUMIN: 4.1 g/dL (ref 3.5–5.0)
ALT: 31 U/L (ref 17–63)
AST: 20 U/L (ref 15–41)
Alkaline Phosphatase: 107 U/L (ref 38–126)
Anion gap: 11 (ref 5–15)
BUN: 7 mg/dL (ref 6–20)
CHLORIDE: 98 mmol/L — AB (ref 101–111)
CO2: 26 mmol/L (ref 22–32)
CREATININE: 0.77 mg/dL (ref 0.61–1.24)
Calcium: 9.4 mg/dL (ref 8.9–10.3)
GFR calc Af Amer: >60 mL/min (ref >60)
GFR calc non Af Amer: >60 mL/min (ref >60)
GLUCOSE: 100 mg/dL — AB (ref 65–99)
POTASSIUM: 4 mmol/L (ref 3.5–5.1)
SODIUM: 135 mmol/L (ref 135–145)
Total Bilirubin: 0.8 mg/dL (ref 0.3–1.2)
Total Protein: 7 g/dL (ref 6.5–8.1)

## 2018-04-10 LAB — CBC
HCT: 51.5 % (ref 39.0–52.0)
Hemoglobin: 18.4 g/dL — ABNORMAL HIGH (ref 13.0–17.0)
MCH: 33.7 pg (ref 26.0–34.0)
MCHC: 35.7 g/dL (ref 30.0–36.0)
MCV: 94.3 fL (ref 78.0–100.0)
PLATELETS: 245 10*3/uL (ref 150–400)
RBC: 5.46 MIL/uL (ref 4.22–5.81)
RDW: 12.7 % (ref 11.5–15.5)
WBC: 9.2 10*3/uL (ref 4.0–10.5)

## 2018-04-10 LAB — SURGICAL PCR SCREEN
MRSA, PCR: NEGATIVE
STAPHYLOCOCCUS AUREUS: NEGATIVE

## 2018-04-10 NOTE — Progress Notes (Addendum)
PCP is DR. Redmond School Denies ever seeing a cardiologist  Denies ever having a stress test, card cath, or echo. Denies chest pain or fever, but reports a cough at times due to allergies. Instructed not to smoke 24 hours prior to surgery -voices understanding.

## 2018-04-20 MED ORDER — BUPIVACAINE LIPOSOME 1.3 % IJ SUSP
20.0000 mL | INTRAMUSCULAR | Status: AC
  Filled 2018-04-20: qty 20

## 2018-04-20 MED ORDER — SODIUM CHLORIDE 0.9 % IV SOLN
1000.0000 mg | INTRAVENOUS | Status: AC
  Filled 2018-04-20: qty 10

## 2018-04-20 MED ORDER — TRANEXAMIC ACID 1000 MG/10ML IV SOLN
2000.0000 mg | Freq: Once | INTRAVENOUS | Status: DC
  Filled 2018-04-20: qty 20

## 2018-04-23 DIAGNOSIS — Z6824 Body mass index (BMI) 24.0-24.9, adult: Secondary | ICD-10-CM | POA: Diagnosis not present

## 2018-04-23 DIAGNOSIS — I10 Essential (primary) hypertension: Secondary | ICD-10-CM | POA: Diagnosis not present

## 2018-04-23 DIAGNOSIS — R197 Diarrhea, unspecified: Secondary | ICD-10-CM | POA: Diagnosis not present

## 2018-04-23 DIAGNOSIS — M1991 Primary osteoarthritis, unspecified site: Secondary | ICD-10-CM | POA: Diagnosis not present

## 2018-04-23 NOTE — Progress Notes (Addendum)
PCP: Redmond School, MD  Cardiologist: denies  EKG:08/07/17 in EPIC  Stress test: pt denies  ECHO: pt denies  Cardiac Cath: pt denies  Chest x-ray: 08/07/17 in Epic  Pt's previous surgery was cancelled due to stomach virus, he currently is on antibiotics and is beginning to feel better.

## 2018-04-23 NOTE — Progress Notes (Addendum)
Jeremy [name]            04/09/2018              Your procedure is scheduled on May 05, 2018.             Report to [facility] Entrance "A" Admitting Office at 800 AM.             Call this number if you have problems the morning of surgery: [phone]             Questions prior to day of surgery, please call [phone] between 8 & 4 PM.             Remember:            Do not eat food or drink liquids after midnight May 04, 2018.            Take these medicines the morning of surgery with A SIP OF WATER: Amlodipine (Norvasc), Diltiazem (Cardiaem), Omeprazole (Prilosec), Flonase nasal spray, Ventolin inhaler - if needed (bring inhaler with you day of surgery)  Stop NSAIDS (Meloxicam, Ibuprofen, Aleve, etc) 7 days prior to surgery. Do not use Aspirin products 7 days prior to surgery.  Do NOT smoke 24 hours prior to surgery.             Do not wear jewelry.            Do not wear lotions, powders, cologne or deodorant.            Men may shave face and neck.            Do not bring valuables to the hospital.            [facility] is not responsible for any belongings or valuables.  Contacts, dentures or bridgework may not be worn into surgery.  Leave your suitcase in the car.  After surgery it may be brought to your room.  For patients admitted to the hospital, discharge time will be determined by your treatment team.  [facility] - Preparing for Surgery  Before surgery, you can play an important role.  Because skin is not sterile, your skin needs to be as free of germs as possible.  You can reduce the number of germs on you skin by washing with CHG (chlorahexidine gluconate) soap before surgery.  CHG is an antiseptic cleaner which kills germs and bonds with the skin to continue killing germs even after washing.  Please DO NOT use if you have an allergy to CHG or antibacterial soaps.  If your skin becomes reddened/irritated stop using the CHG and inform  your nurse when you arrive at Short Stay.  Do not shave (including legs and underarms) for at least 48 hours prior to the first CHG shower.  You may shave your face.  Please follow these instructions carefully:             1.  Shower with CHG Soap the night before surgery and the                           morning of Surgery.            2.  If you choose to wash your hair, wash your hair first as usual with your                normal shampoo.  3.  After you shampoo, rinse your hair and body thoroughly to remove the shampoo.            4.  Use CHG as you would any other liquid soap.  You can apply chg directly               to the skin and wash gently with scrungie or a clean washcloth.            5.  Apply the CHG Soap to your body ONLY FROM THE NECK DOWN.          Do not use on open wounds or open sores.  Avoid contact with your eyes, ears, mouth and genitals (private parts).  Wash genitals (private parts) with your normal soap.            6.  Wash thoroughly, paying special attention to the area where your surgery               will be performed.            7.  Thoroughly rinse your body with warm water from the neck down.            8.  DO NOT shower/wash with your normal soap after using and rinsing off                the CHG Soap.            9.  Pat yourself dry with a clean towel.            10.  Wear clean pajamas.            11.  Place clean sheets on your bed the night of your first shower and do not           sleep with pets.  Day of Surgery  Shower as above. Do not apply any lotions/deodorants the morning of surgery.  Please wear clean clothes to the hospital.   Please read over the fact sheets that you were given.

## 2018-04-24 ENCOUNTER — Other Ambulatory Visit: Payer: Self-pay

## 2018-04-24 ENCOUNTER — Encounter (HOSPITAL_COMMUNITY): Payer: Self-pay

## 2018-04-24 ENCOUNTER — Encounter (HOSPITAL_COMMUNITY)
Admission: RE | Admit: 2018-04-24 | Discharge: 2018-04-24 | Disposition: A | Discharge status: Home / Self Care | Payer: Medicare HMO | Source: Ambulatory Visit | Attending: Orthopedic Surgery | Admitting: Orthopedic Surgery

## 2018-04-24 DIAGNOSIS — Z01818 Encounter for other preprocedural examination: Secondary | ICD-10-CM | POA: Insufficient documentation

## 2018-04-24 DIAGNOSIS — M1612 Unilateral primary osteoarthritis, left hip: Secondary | ICD-10-CM | POA: Diagnosis not present

## 2018-04-24 LAB — CBC
HEMATOCRIT: 50.2 % (ref 39.0–52.0)
HEMOGLOBIN: 17.5 g/dL — AB (ref 13.0–17.0)
MCH: 32.2 pg (ref 26.0–34.0)
MCHC: 34.9 g/dL (ref 30.0–36.0)
MCV: 92.3 fL (ref 78.0–100.0)
Platelets: 290 10*3/uL (ref 150–400)
RBC: 5.44 MIL/uL (ref 4.22–5.81)
RDW: 12.4 % (ref 11.5–15.5)
WBC: 8.5 10*3/uL (ref 4.0–10.5)

## 2018-04-24 LAB — COMPREHENSIVE METABOLIC PANEL
ALBUMIN: 3.9 g/dL (ref 3.5–5.0)
ALK PHOS: 94 U/L (ref 38–126)
ALT: 38 U/L (ref 17–63)
AST: 26 U/L (ref 15–41)
Anion gap: 10 (ref 5–15)
BUN: 7 mg/dL (ref 6–20)
CALCIUM: 9 mg/dL (ref 8.9–10.3)
CO2: 27 mmol/L (ref 22–32)
CREATININE: 0.87 mg/dL (ref 0.61–1.24)
Chloride: 93 mmol/L — ABNORMAL LOW (ref 101–111)
GFR calc Af Amer: >60 mL/min (ref >60)
GFR calc non Af Amer: >60 mL/min (ref >60)
GLUCOSE: 95 mg/dL (ref 65–99)
Potassium: 3.9 mmol/L (ref 3.5–5.1)
Sodium: 130 mmol/L — ABNORMAL LOW (ref 135–145)
Total Bilirubin: 1.1 mg/dL (ref 0.3–1.2)
Total Protein: 6.5 g/dL (ref 6.5–8.1)

## 2018-05-05 ENCOUNTER — Encounter (HOSPITAL_COMMUNITY)
Admission: RE | Disposition: A | Discharge status: Home Health | Payer: Self-pay | Source: Home / Self Care | Attending: Orthopedic Surgery

## 2018-05-05 ENCOUNTER — Inpatient Hospital Stay (HOSPITAL_COMMUNITY): Payer: Medicare HMO | Admitting: Vascular Surgery

## 2018-05-05 ENCOUNTER — Inpatient Hospital Stay (HOSPITAL_COMMUNITY): Payer: Medicare HMO

## 2018-05-05 ENCOUNTER — Encounter (HOSPITAL_COMMUNITY): Payer: Self-pay | Admitting: *Deleted

## 2018-05-05 ENCOUNTER — Other Ambulatory Visit: Payer: Self-pay

## 2018-05-05 ENCOUNTER — Inpatient Hospital Stay (HOSPITAL_COMMUNITY)
Admission: RE | Admit: 2018-05-05 | Discharge: 2018-05-06 | DRG: 470 | Disposition: A | Discharge status: Home Health | Payer: Medicare HMO | Attending: Orthopedic Surgery | Admitting: Orthopedic Surgery

## 2018-05-05 DIAGNOSIS — M1612 Unilateral primary osteoarthritis, left hip: Secondary | ICD-10-CM | POA: Diagnosis not present

## 2018-05-05 DIAGNOSIS — M25752 Osteophyte, left hip: Secondary | ICD-10-CM | POA: Diagnosis present

## 2018-05-05 DIAGNOSIS — R69 Illness, unspecified: Secondary | ICD-10-CM | POA: Diagnosis not present

## 2018-05-05 DIAGNOSIS — Z720 Tobacco use: Secondary | ICD-10-CM | POA: Diagnosis present

## 2018-05-05 DIAGNOSIS — Z419 Encounter for procedure for purposes other than remedying health state, unspecified: Secondary | ICD-10-CM

## 2018-05-05 DIAGNOSIS — M1990 Unspecified osteoarthritis, unspecified site: Secondary | ICD-10-CM | POA: Diagnosis present

## 2018-05-05 DIAGNOSIS — M1610 Unilateral primary osteoarthritis, unspecified hip: Secondary | ICD-10-CM | POA: Diagnosis present

## 2018-05-05 DIAGNOSIS — Z789 Other specified health status: Secondary | ICD-10-CM

## 2018-05-05 DIAGNOSIS — Z8249 Family history of ischemic heart disease and other diseases of the circulatory system: Secondary | ICD-10-CM | POA: Diagnosis not present

## 2018-05-05 DIAGNOSIS — Z791 Long term (current) use of non-steroidal anti-inflammatories (NSAID): Secondary | ICD-10-CM

## 2018-05-05 DIAGNOSIS — Z9889 Other specified postprocedural states: Secondary | ICD-10-CM

## 2018-05-05 DIAGNOSIS — Z79899 Other long term (current) drug therapy: Secondary | ICD-10-CM

## 2018-05-05 DIAGNOSIS — J45909 Unspecified asthma, uncomplicated: Secondary | ICD-10-CM | POA: Diagnosis present

## 2018-05-05 DIAGNOSIS — Z96642 Presence of left artificial hip joint: Secondary | ICD-10-CM | POA: Diagnosis not present

## 2018-05-05 DIAGNOSIS — I10 Essential (primary) hypertension: Secondary | ICD-10-CM | POA: Diagnosis present

## 2018-05-05 DIAGNOSIS — Z7289 Other problems related to lifestyle: Secondary | ICD-10-CM | POA: Diagnosis present

## 2018-05-05 DIAGNOSIS — K219 Gastro-esophageal reflux disease without esophagitis: Secondary | ICD-10-CM | POA: Diagnosis present

## 2018-05-05 DIAGNOSIS — F1090 Alcohol use, unspecified, uncomplicated: Secondary | ICD-10-CM | POA: Diagnosis present

## 2018-05-05 DIAGNOSIS — Z880 Allergy status to penicillin: Secondary | ICD-10-CM | POA: Diagnosis not present

## 2018-05-05 DIAGNOSIS — F17210 Nicotine dependence, cigarettes, uncomplicated: Secondary | ICD-10-CM | POA: Diagnosis present

## 2018-05-05 DIAGNOSIS — M25552 Pain in left hip: Secondary | ICD-10-CM | POA: Diagnosis present

## 2018-05-05 DIAGNOSIS — Z471 Aftercare following joint replacement surgery: Secondary | ICD-10-CM | POA: Diagnosis not present

## 2018-05-05 HISTORY — PX: TOTAL HIP ARTHROPLASTY: SHX124

## 2018-05-05 HISTORY — DX: Unspecified asthma, uncomplicated: J45.909

## 2018-05-05 HISTORY — DX: Other seasonal allergic rhinitis: J30.2

## 2018-05-05 HISTORY — DX: Unspecified chronic bronchitis: J42

## 2018-05-05 HISTORY — DX: Family history of other specified conditions: Z84.89

## 2018-05-05 SURGERY — ARTHROPLASTY, HIP, TOTAL, ANTERIOR APPROACH
Anesthesia: Spinal | Site: Hip | Laterality: Left

## 2018-05-05 MED ORDER — CEFAZOLIN SODIUM-DEXTROSE 2-4 GM/100ML-% IV SOLN
2.0000 g | INTRAVENOUS | Status: AC
  Administered 2018-05-05: 2 g via INTRAVENOUS
  Filled 2018-05-05: qty 100

## 2018-05-05 MED ORDER — PROPOFOL 10 MG/ML IV BOLUS
INTRAVENOUS | Status: AC
  Filled 2018-05-05: qty 20

## 2018-05-05 MED ORDER — BACLOFEN 10 MG PO TABS: 10.0000 mg | ORAL_TABLET | Freq: Three times a day (TID) | ORAL | 0 refills | Status: DC | PRN

## 2018-05-05 MED ORDER — ALBUTEROL SULFATE HFA 108 (90 BASE) MCG/ACT IN AERS: 1.0000 | INHALATION_SPRAY | Freq: Four times a day (QID) | RESPIRATORY_TRACT | Status: DC | PRN

## 2018-05-05 MED ORDER — KETOROLAC TROMETHAMINE 15 MG/ML IJ SOLN
7.5000 mg | Freq: Four times a day (QID) | INTRAMUSCULAR | Status: DC
  Administered 2018-05-05 – 2018-05-06 (×3): 7.5 mg via INTRAVENOUS
  Filled 2018-05-05 (×3): qty 1

## 2018-05-05 MED ORDER — MENTHOL 3 MG MT LOZG: 1.0000 | LOZENGE | OROMUCOSAL | Status: DC | PRN

## 2018-05-05 MED ORDER — LORAZEPAM 1 MG PO TABS: 1.0000 mg | ORAL_TABLET | Freq: Four times a day (QID) | ORAL | Status: DC | PRN

## 2018-05-05 MED ORDER — AMLODIPINE BESYLATE 10 MG PO TABS
10.0000 mg | ORAL_TABLET | Freq: Every day | ORAL | Status: DC
  Administered 2018-05-06: 10 mg via ORAL
  Filled 2018-05-05: qty 1

## 2018-05-05 MED ORDER — DEXAMETHASONE SODIUM PHOSPHATE 10 MG/ML IJ SOLN
INTRAMUSCULAR | Status: AC
  Filled 2018-05-05: qty 1

## 2018-05-05 MED ORDER — ADULT MULTIVITAMIN W/MINERALS CH
1.0000 | ORAL_TABLET | Freq: Every day | ORAL | Status: DC
Start: 2018-05-05 — End: 2018-05-06
  Administered 2018-05-05 – 2018-05-06 (×2): 1 via ORAL
  Filled 2018-05-05 (×2): qty 1

## 2018-05-05 MED ORDER — ACETAMINOPHEN 500 MG PO TABS
1000.0000 mg | ORAL_TABLET | Freq: Once | ORAL | Status: AC
  Administered 2018-05-05: 1000 mg via ORAL
  Filled 2018-05-05: qty 2

## 2018-05-05 MED ORDER — PHENOL 1.4 % MT LIQD: 1.0000 | OROMUCOSAL | Status: DC | PRN

## 2018-05-05 MED ORDER — SODIUM CHLORIDE 0.9 % IV SOLN
INTRAVENOUS | Status: DC
  Administered 2018-05-05: 22:00:00 via INTRAVENOUS

## 2018-05-05 MED ORDER — ONDANSETRON HCL 4 MG/2ML IJ SOLN: 4.0000 mg | Freq: Four times a day (QID) | INTRAMUSCULAR | Status: DC | PRN

## 2018-05-05 MED ORDER — DOCUSATE SODIUM 100 MG PO CAPS
100.0000 mg | ORAL_CAPSULE | Freq: Two times a day (BID) | ORAL | Status: DC
  Administered 2018-05-05 – 2018-05-06 (×2): 100 mg via ORAL
  Filled 2018-05-05 (×2): qty 1

## 2018-05-05 MED ORDER — METOCLOPRAMIDE HCL 5 MG/ML IJ SOLN: 5.0000 mg | Freq: Three times a day (TID) | INTRAMUSCULAR | Status: DC | PRN

## 2018-05-05 MED ORDER — MAGNESIUM CITRATE PO SOLN: 1.0000 | Freq: Once | ORAL | Status: DC | PRN

## 2018-05-05 MED ORDER — MEPERIDINE HCL 50 MG/ML IJ SOLN: 6.2500 mg | INTRAMUSCULAR | Status: DC | PRN

## 2018-05-05 MED ORDER — PHENYLEPHRINE 40 MCG/ML (10ML) SYRINGE FOR IV PUSH (FOR BLOOD PRESSURE SUPPORT)
PREFILLED_SYRINGE | INTRAVENOUS | Status: DC | PRN
  Administered 2018-05-05: 65 ug via INTRAVENOUS

## 2018-05-05 MED ORDER — BUPIVACAINE IN DEXTROSE 0.75-8.25 % IT SOLN
INTRATHECAL | Status: DC | PRN
  Administered 2018-05-05: 2 mL via INTRATHECAL

## 2018-05-05 MED ORDER — 0.9 % SODIUM CHLORIDE (POUR BTL) OPTIME
TOPICAL | Status: DC | PRN
  Administered 2018-05-05: 1000 mL

## 2018-05-05 MED ORDER — MIDAZOLAM HCL 2 MG/2ML IJ SOLN
INTRAMUSCULAR | Status: AC
  Filled 2018-05-05: qty 2

## 2018-05-05 MED ORDER — ONDANSETRON HCL 4 MG/2ML IJ SOLN
INTRAMUSCULAR | Status: AC
  Filled 2018-05-05: qty 2

## 2018-05-05 MED ORDER — SODIUM CHLORIDE FLUSH 0.9 % IV SOLN
INTRAVENOUS | Status: DC | PRN
  Administered 2018-05-05: 30 mL via INTRAVENOUS

## 2018-05-05 MED ORDER — SODIUM CHLORIDE 0.9 % IV SOLN
2000.0000 mg | Freq: Once | INTRAVENOUS | Status: DC
  Filled 2018-05-05: qty 20

## 2018-05-05 MED ORDER — ASPIRIN EC 81 MG PO TBEC: 81.0000 mg | DELAYED_RELEASE_TABLET | Freq: Two times a day (BID) | ORAL | 0 refills | Status: DC

## 2018-05-05 MED ORDER — METHOCARBAMOL 1000 MG/10ML IJ SOLN
500.0000 mg | Freq: Four times a day (QID) | INTRAVENOUS | Status: DC | PRN
  Filled 2018-05-05: qty 5

## 2018-05-05 MED ORDER — FENTANYL CITRATE (PF) 100 MCG/2ML IJ SOLN: 25.0000 ug | INTRAMUSCULAR | Status: DC | PRN

## 2018-05-05 MED ORDER — FOLIC ACID 1 MG PO TABS
1.0000 mg | ORAL_TABLET | Freq: Every day | ORAL | Status: DC
  Administered 2018-05-05 – 2018-05-06 (×2): 1 mg via ORAL
  Filled 2018-05-05 (×2): qty 1

## 2018-05-05 MED ORDER — METOCLOPRAMIDE HCL 5 MG PO TABS: 5.0000 mg | ORAL_TABLET | Freq: Three times a day (TID) | ORAL | Status: DC | PRN

## 2018-05-05 MED ORDER — DEXAMETHASONE SODIUM PHOSPHATE 10 MG/ML IJ SOLN
INTRAMUSCULAR | Status: DC | PRN
  Administered 2018-05-05: 5 mg via INTRAVENOUS

## 2018-05-05 MED ORDER — DOCUSATE SODIUM 100 MG PO CAPS: 100.0000 mg | ORAL_CAPSULE | Freq: Two times a day (BID) | ORAL | 0 refills | Status: DC

## 2018-05-05 MED ORDER — ASPIRIN 81 MG PO CHEW
81.0000 mg | CHEWABLE_TABLET | Freq: Two times a day (BID) | ORAL | Status: DC
  Administered 2018-05-05 – 2018-05-06 (×2): 81 mg via ORAL
  Filled 2018-05-05 (×2): qty 1

## 2018-05-05 MED ORDER — LACTATED RINGERS IV SOLN
INTRAVENOUS | Status: DC
  Administered 2018-05-05 (×2): via INTRAVENOUS

## 2018-05-05 MED ORDER — GLYCOPYRROLATE PF 0.2 MG/ML IJ SOSY
PREFILLED_SYRINGE | INTRAMUSCULAR | Status: DC | PRN
  Administered 2018-05-05: .2 mg via INTRAVENOUS

## 2018-05-05 MED ORDER — METHOCARBAMOL 500 MG PO TABS
500.0000 mg | ORAL_TABLET | Freq: Four times a day (QID) | ORAL | Status: DC | PRN
  Administered 2018-05-06: 500 mg via ORAL
  Filled 2018-05-05: qty 1

## 2018-05-05 MED ORDER — MORPHINE SULFATE (PF) 2 MG/ML IV SOLN: 0.5000 mg | INTRAVENOUS | Status: DC | PRN

## 2018-05-05 MED ORDER — PHENYLEPHRINE HCL 10 MG/ML IJ SOLN
INTRAVENOUS | Status: DC | PRN
  Administered 2018-05-05: 20 ug/min via INTRAVENOUS

## 2018-05-05 MED ORDER — PROPOFOL 500 MG/50ML IV EMUL
INTRAVENOUS | Status: DC | PRN
  Administered 2018-05-05: 75 ug/kg/min via INTRAVENOUS

## 2018-05-05 MED ORDER — SODIUM CHLORIDE 0.9 % IV SOLN
1000.0000 mg | INTRAVENOUS | Status: AC
  Administered 2018-05-05: 1000 mg via INTRAVENOUS
  Filled 2018-05-05: qty 1100

## 2018-05-05 MED ORDER — LACTATED RINGERS IV SOLN: INTRAVENOUS | Status: DC

## 2018-05-05 MED ORDER — ONDANSETRON HCL 4 MG/2ML IJ SOLN: 4.0000 mg | Freq: Once | INTRAMUSCULAR | Status: DC | PRN

## 2018-05-05 MED ORDER — ACETAMINOPHEN 160 MG/5ML PO SOLN: 325.0000 mg | ORAL | Status: DC | PRN

## 2018-05-05 MED ORDER — CHLORHEXIDINE GLUCONATE 4 % EX LIQD: 60.0000 mL | Freq: Once | CUTANEOUS | Status: DC

## 2018-05-05 MED ORDER — DIPHENHYDRAMINE HCL 12.5 MG/5ML PO ELIX: 12.5000 mg | ORAL_SOLUTION | ORAL | Status: DC | PRN

## 2018-05-05 MED ORDER — LISINOPRIL 40 MG PO TABS
40.0000 mg | ORAL_TABLET | Freq: Every day | ORAL | Status: DC
  Administered 2018-05-05 – 2018-05-06 (×2): 40 mg via ORAL
  Filled 2018-05-05 (×2): qty 1

## 2018-05-05 MED ORDER — HYDROCODONE-ACETAMINOPHEN 5-325 MG PO TABS: 1.0000 | ORAL_TABLET | Freq: Four times a day (QID) | ORAL | 0 refills | Status: DC | PRN

## 2018-05-05 MED ORDER — VITAMIN B-1 100 MG PO TABS
100.0000 mg | ORAL_TABLET | Freq: Every day | ORAL | Status: DC
  Administered 2018-05-05 – 2018-05-06 (×2): 100 mg via ORAL
  Filled 2018-05-05 (×2): qty 1

## 2018-05-05 MED ORDER — SODIUM CHLORIDE 0.9 % IV SOLN
INTRAVENOUS | Status: DC | PRN
  Administered 2018-05-05: 2000 mg via TOPICAL

## 2018-05-05 MED ORDER — PROPOFOL 10 MG/ML IV BOLUS
INTRAVENOUS | Status: DC | PRN
  Administered 2018-05-05: 30 mg via INTRAVENOUS
  Administered 2018-05-05: 20 mg via INTRAVENOUS
  Administered 2018-05-05: 30 mg via INTRAVENOUS

## 2018-05-05 MED ORDER — POLYETHYLENE GLYCOL 3350 17 G PO PACK: 17.0000 g | PACK | Freq: Every day | ORAL | Status: DC | PRN

## 2018-05-05 MED ORDER — MIDAZOLAM HCL 5 MG/5ML IJ SOLN
INTRAMUSCULAR | Status: DC | PRN
  Administered 2018-05-05 (×2): 2 mg via INTRAVENOUS

## 2018-05-05 MED ORDER — ONDANSETRON HCL 4 MG/2ML IJ SOLN
INTRAMUSCULAR | Status: DC | PRN
  Administered 2018-05-05: 4 mg via INTRAVENOUS

## 2018-05-05 MED ORDER — HYDROCODONE-ACETAMINOPHEN 5-325 MG PO TABS
1.0000 | ORAL_TABLET | ORAL | Status: DC | PRN
  Administered 2018-05-05 – 2018-05-06 (×2): 2 via ORAL
  Filled 2018-05-05 (×3): qty 2

## 2018-05-05 MED ORDER — CEFAZOLIN SODIUM-DEXTROSE 1-4 GM/50ML-% IV SOLN
1.0000 g | Freq: Four times a day (QID) | INTRAVENOUS | Status: AC
  Administered 2018-05-05 – 2018-05-06 (×2): 1 g via INTRAVENOUS
  Filled 2018-05-05 (×2): qty 50

## 2018-05-05 MED ORDER — PANTOPRAZOLE SODIUM 40 MG PO TBEC
40.0000 mg | DELAYED_RELEASE_TABLET | Freq: Every day | ORAL | Status: DC
  Administered 2018-05-06: 40 mg via ORAL
  Filled 2018-05-05: qty 1

## 2018-05-05 MED ORDER — ACETAMINOPHEN 325 MG PO TABS: 325.0000 mg | ORAL_TABLET | Freq: Four times a day (QID) | ORAL | Status: DC | PRN

## 2018-05-05 MED ORDER — FLUTICASONE PROPIONATE 50 MCG/ACT NA SUSP
1.0000 | Freq: Every day | NASAL | Status: DC
  Administered 2018-05-06: 1 via NASAL
  Filled 2018-05-05: qty 16

## 2018-05-05 MED ORDER — EPHEDRINE SULFATE 50 MG/ML IJ SOLN
INTRAMUSCULAR | Status: AC
  Filled 2018-05-05: qty 1

## 2018-05-05 MED ORDER — DILTIAZEM HCL ER COATED BEADS 240 MG PO CP24
240.0000 mg | ORAL_CAPSULE | Freq: Every day | ORAL | Status: DC
  Administered 2018-05-06: 240 mg via ORAL
  Filled 2018-05-05: qty 1

## 2018-05-05 MED ORDER — OXYCODONE HCL 5 MG/5ML PO SOLN: 5.0000 mg | Freq: Once | ORAL | Status: DC | PRN

## 2018-05-05 MED ORDER — THIAMINE HCL 100 MG/ML IJ SOLN: 100.0000 mg | Freq: Every day | INTRAMUSCULAR | Status: DC

## 2018-05-05 MED ORDER — LORAZEPAM 2 MG/ML IJ SOLN
1.0000 mg | Freq: Four times a day (QID) | INTRAMUSCULAR | Status: DC | PRN
Start: 2018-05-05 — End: 2018-05-06

## 2018-05-05 MED ORDER — ONDANSETRON HCL 4 MG PO TABS: 4.0000 mg | ORAL_TABLET | Freq: Four times a day (QID) | ORAL | Status: DC | PRN

## 2018-05-05 MED ORDER — DEXAMETHASONE SODIUM PHOSPHATE 10 MG/ML IJ SOLN
10.0000 mg | Freq: Once | INTRAMUSCULAR | Status: AC
  Administered 2018-05-06: 10 mg via INTRAVENOUS
  Filled 2018-05-05: qty 1

## 2018-05-05 MED ORDER — BUPIVACAINE LIPOSOME 1.3 % IJ SUSP
20.0000 mL | Freq: Once | INTRAMUSCULAR | Status: AC
  Administered 2018-05-05: 20 mL
  Filled 2018-05-05: qty 20

## 2018-05-05 MED ORDER — EPHEDRINE SULFATE-NACL 50-0.9 MG/10ML-% IV SOSY
PREFILLED_SYRINGE | INTRAVENOUS | Status: DC | PRN
  Administered 2018-05-05 (×2): 5 mg via INTRAVENOUS

## 2018-05-05 MED ORDER — OMEPRAZOLE MAGNESIUM 20 MG PO TBEC: 20.0000 mg | DELAYED_RELEASE_TABLET | Freq: Every day | ORAL | Status: DC

## 2018-05-05 MED ORDER — GABAPENTIN 300 MG PO CAPS
300.0000 mg | ORAL_CAPSULE | Freq: Once | ORAL | Status: AC
  Administered 2018-05-05: 300 mg via ORAL
  Filled 2018-05-05: qty 1

## 2018-05-05 MED ORDER — FENTANYL CITRATE (PF) 250 MCG/5ML IJ SOLN
INTRAMUSCULAR | Status: DC | PRN
  Administered 2018-05-05 (×2): 50 ug via INTRAVENOUS

## 2018-05-05 MED ORDER — ALBUTEROL SULFATE (2.5 MG/3ML) 0.083% IN NEBU: 2.5000 mg | INHALATION_SOLUTION | Freq: Four times a day (QID) | RESPIRATORY_TRACT | Status: DC | PRN

## 2018-05-05 MED ORDER — ACETAMINOPHEN 325 MG PO TABS: 325.0000 mg | ORAL_TABLET | ORAL | Status: DC | PRN

## 2018-05-05 MED ORDER — FENTANYL CITRATE (PF) 250 MCG/5ML IJ SOLN
INTRAMUSCULAR | Status: AC
  Filled 2018-05-05: qty 5

## 2018-05-05 MED ORDER — ONDANSETRON HCL 4 MG PO TABS: 4.0000 mg | ORAL_TABLET | Freq: Three times a day (TID) | ORAL | 0 refills | Status: DC | PRN

## 2018-05-05 MED ORDER — SORBITOL 70 % SOLN: 30.0000 mL | Freq: Every day | Status: DC | PRN

## 2018-05-05 MED ORDER — OXYCODONE HCL 5 MG PO TABS: 5.0000 mg | ORAL_TABLET | Freq: Once | ORAL | Status: DC | PRN

## 2018-05-05 SURGICAL SUPPLY — 52 items
BAG DECANTER FOR FLEXI CONT (MISCELLANEOUS) IMPLANT
BLADE SAG 18X100X1.27 (BLADE) IMPLANT
CLSR STERI-STRIP ANTIMIC 1/2X4 (GAUZE/BANDAGES/DRESSINGS) ×4 IMPLANT
COVER PERINEAL POST (MISCELLANEOUS) ×2 IMPLANT
COVER SURGICAL LIGHT HANDLE (MISCELLANEOUS) ×2 IMPLANT
DRAPE C-ARM 42X72 X-RAY (DRAPES) ×2 IMPLANT
DRAPE STERI IOBAN 125X83 (DRAPES) ×2 IMPLANT
DRAPE U-SHAPE 47X51 STRL (DRAPES) IMPLANT
DRSG MEPILEX BORDER 4X8 (GAUZE/BANDAGES/DRESSINGS) ×2 IMPLANT
DURAPREP 26ML APPLICATOR (WOUND CARE) ×2 IMPLANT
ELECT BLADE 4.0 EZ CLEAN MEGAD (MISCELLANEOUS) ×2
ELECT REM PT RETURN 9FT ADLT (ELECTROSURGICAL) ×2
ELECTRODE BLDE 4.0 EZ CLN MEGD (MISCELLANEOUS) ×1 IMPLANT
ELECTRODE REM PT RTRN 9FT ADLT (ELECTROSURGICAL) ×1 IMPLANT
FACESHIELD WRAPAROUND (MASK) ×4 IMPLANT
GLOVE BIO SURGEON STRL SZ7.5 (GLOVE) ×4 IMPLANT
GLOVE BIOGEL PI IND STRL 8 (GLOVE) ×2 IMPLANT
GLOVE BIOGEL PI INDICATOR 8 (GLOVE) ×2
GOWN STRL REUS W/ TWL LRG LVL3 (GOWN DISPOSABLE) ×2 IMPLANT
GOWN STRL REUS W/TWL LRG LVL3 (GOWN DISPOSABLE) ×2
HEAD CERAMIC FEMORAL 36MM (Head) ×2 IMPLANT
INSERT TRIDENT POLY 36MM 0DEG (Insert) ×2 IMPLANT
KIT BASIN OR (CUSTOM PROCEDURE TRAY) ×2 IMPLANT
KIT TURNOVER KIT B (KITS) ×2 IMPLANT
MANIFOLD NEPTUNE II (INSTRUMENTS) ×2 IMPLANT
NDL SAFETY ECLIPSE 18X1.5 (NEEDLE) IMPLANT
NEEDLE HYPO 18GX1.5 SHARP (NEEDLE)
NEEDLE HYPO 22GX1.5 SAFETY (NEEDLE) ×2 IMPLANT
NS IRRIG 1000ML POUR BTL (IV SOLUTION) ×2 IMPLANT
PACK TOTAL JOINT (CUSTOM PROCEDURE TRAY) ×2 IMPLANT
PAD ARMBOARD 7.5X6 YLW CONV (MISCELLANEOUS) ×2 IMPLANT
SCREW HEX LP 6.5X20 (Screw) ×2 IMPLANT
SHELL ACETABUL CLUSTER SZ 54 (Shell) ×2 IMPLANT
SPONGE LAP 18X18 X RAY DECT (DISPOSABLE) IMPLANT
STEM ACCOLADE SZ 6 (Hips) ×2 IMPLANT
SUT MNCRL AB 4-0 PS2 18 (SUTURE) ×2 IMPLANT
SUT VIC AB 0 CT1 27 (SUTURE) ×2
SUT VIC AB 0 CT1 27XBRD ANBCTR (SUTURE) ×1 IMPLANT
SUT VIC AB 1 CT1 27 (SUTURE) ×1
SUT VIC AB 1 CT1 27XBRD ANBCTR (SUTURE) ×1 IMPLANT
SUT VIC AB 2-0 CT1 27 (SUTURE) ×2
SUT VIC AB 2-0 CT1 TAPERPNT 27 (SUTURE) ×2 IMPLANT
SUT VLOC 180 0 24IN GS25 (SUTURE) IMPLANT
SYR 50ML LL SCALE MARK (SYRINGE) ×2 IMPLANT
SYR BULB IRRIGATION 50ML (SYRINGE) ×2 IMPLANT
SYRINGE 20CC LL (MISCELLANEOUS) IMPLANT
TOWEL OR 17X24 6PK STRL BLUE (TOWEL DISPOSABLE) ×2 IMPLANT
TOWEL OR 17X26 10 PK STRL BLUE (TOWEL DISPOSABLE) ×2 IMPLANT
TRAY CATH 16FR W/PLASTIC CATH (SET/KITS/TRAYS/PACK) IMPLANT
TRAY FOLEY MTR SLVR 16FR STAT (SET/KITS/TRAYS/PACK) IMPLANT
WATER STERILE IRR 1000ML POUR (IV SOLUTION) ×2 IMPLANT
YANKAUER SUCT BULB TIP NO VENT (SUCTIONS) ×4 IMPLANT

## 2018-05-05 NOTE — Op Note (Signed)
05/05/2018  12:02 PM  PATIENT:  Jeremy [name]   MRN: [id number]  PRE-OPERATIVE DIAGNOSIS:  OA LEFT HIP  POST-OPERATIVE DIAGNOSIS:  OA LEFT HIP  PROCEDURE:  Procedure(s): LEFT TOTAL HIP ARTHROPLASTY ANTERIOR APPROACH  PREOPERATIVE INDICATIONS:    Jeremy [name] is an 67 y.o. male who has a diagnosis of Primary osteoarthritis of left hip and elected for surgical management after failing conservative treatment.  The risks benefits and alternatives were discussed with the patient including but not limited to the risks of nonoperative treatment, versus surgical intervention including infection, bleeding, nerve injury, periprosthetic fracture, the need for revision surgery, dislocation, leg length discrepancy, blood clots, cardiopulmonary complications, morbidity, mortality, among others, and they were willing to proceed.     OPERATIVE REPORT     SURGEON:   MURPHY, Ernesta Amble, MD    ASSISTANT:  Roxan Hockey, PA-C, he was present and scrubbed throughout the case, critical for completion in a timely fashion, and for retraction, instrumentation, and closure.     ANESTHESIA:  General    COMPLICATIONS:  None.     COMPONENTS:  [employer]ker acolade fit femur size 6 with a 36 mm -0 head ball and a PSL acetabular shell size 54 with a  polyethylene liner    PROCEDURE IN DETAIL:   The patient was met in the holding area and  identified.  The appropriate hip was identified and marked at the operative site.  The patient was then transported to the OR  and  placed under anesthesia per that record.  At that point, the patient was  placed in the supine position and  secured to the operating room table and all bony prominences padded. He received pre-operative antibiotics    The operative lower extremity was prepped from the iliac crest to the distal leg.  Sterile draping was performed.  Time out was performed prior to incision.      Skin incision was made just 2 cm lateral to the ASIS  extending in  line with the tensor fascia lata. Electrocautery was used to control all bleeders. I dissected down sharply to the fascia of the tensor fascia lata was confirmed that the muscle fibers beneath were running posteriorly. I then incised the fascia over the superficial tensor fascia lata in line with the incision. The fascia was elevated off the anterior aspect of the muscle the muscle was retracted posteriorly and protected throughout the case. I then used electrocautery to incise the tensor fascia lata fascia control and all bleeders. Immediately visible was the fat over top of the anterior neck and capsule.  I removed the anterior fat from the capsule and elevated the rectus muscle off of the anterior capsule. I then removed a large time of capsule. The retractors were then placed over the anterior acetabulum as well as around the superior and inferior neck.  I then removed a section of the femoral neck and a napkin ring fashion. Then used the power course to remove the femoral head from the acetabulum and thoroughly irrigated the acetabulum. I sized the femoral head.    I then exposed the deep acetabulum, cleared out any tissue including the ligamentum teres.   After adequate visualization, I excised the labrum, and then sequentially reamed.  I then impacted the acetabular implant into place using fluoroscopy for guidance.  Appropriate version and inclination was confirmed clinically matching their bony anatomy, and with fluoroscopy.  I placed a 20 mm screw in the posterior/superio position with an excellent bite.  I then placed the polyethylene liner in place  I then adducted the leg and released the external rotators from the posterior femur allowing it to be easily delivered up lateral and anterior to the acetabulum for preparation of the femoral canal.    I then prepared the proximal femur using the cookie-cutter and then sequentially reamed and broached.  A trial broach, neck, and head was  utilized, and I reduced the hip and used floroscopy to assess the neck length and femoral implant.  I then impacted the femoral prosthesis into place into the appropriate version. The hip was then reduced and fluoroscopy confirmed appropriate position. Leg lengths were restored.  I then irrigated the hip copiously again with, and repaired the fascia with Vicryl, followed by monocryl for the subcutaneous tissue, Monocryl for the skin, Steri-Strips and sterile gauze. The patient was then awakened and returned to PACU in stable and satisfactory condition. There were no complications.  POST OPERATIVE PLAN: WBAT, DVT px: SCD's/TED, ambulation and chemical dvt px  Timothy Murphy, MD Orthopedic Surgeon [phone]     

## 2018-05-05 NOTE — Anesthesia Postprocedure Evaluation (Signed)
Anesthesia Post Note  Patient: Jeremy [name]  Procedure(s) Performed: LEFT TOTAL HIP ARTHROPLASTY ANTERIOR APPROACH (Left Hip)     Patient location during evaluation: PACU Anesthesia Type: Spinal Level of consciousness: awake and alert, oriented and patient cooperative Pain management: pain level controlled Vital Signs Assessment: post-procedure vital signs reviewed and stable Respiratory status: spontaneous breathing, nonlabored ventilation, respiratory function stable and patient connected to nasal cannula oxygen Cardiovascular status: blood pressure returned to baseline and stable Postop Assessment: spinal receding, patient able to bend at knees and no apparent nausea or vomiting Anesthetic complications: no    Last Vitals:  Vitals:   05/05/18 1355 05/05/18 1405  BP:  (!) 114/59  Pulse: 66 61  Resp: 13 19  Temp:    SpO2: 91% 94%    Last Pain:  Vitals:   05/05/18 1405  TempSrc:   PainSc: 0-No pain                 Spencer Cardinal,E. Tiler Brandis

## 2018-05-05 NOTE — Interval H&P Note (Signed)
History and Physical Interval Note:  05/05/2018 9:06 AM  Jeremy [name]  has presented today for surgery, with the diagnosis of OA LEFT HIP  The various methods of treatment have been discussed with the patient and family. After consideration of risks, benefits and other options for treatment, the patient has consented to  Procedure(s): LEFT TOTAL HIP ARTHROPLASTY ANTERIOR APPROACH (Left) as a surgical intervention .  The patient's history has been reviewed, patient examined, no change in status, stable for surgery.  I have reviewed the patient's chart and labs.  Questions were answered to the patient's satisfaction.     [name]

## 2018-05-05 NOTE — Anesthesia Procedure Notes (Signed)
Spinal  Patient location during procedure: OR Start time: 05/05/2018 10:48 AM End time: 05/05/2018 10:52 AM Staffing Anesthesiologist: Janeece Riggers, MD Preanesthetic Checklist Completed: patient identified, site marked, surgical consent, pre-op evaluation, timeout performed, IV checked, risks and benefits discussed and monitors and equipment checked Spinal Block Patient position: sitting Prep: DuraPrep Patient monitoring: heart rate, cardiac monitor, continuous pulse ox and blood pressure Approach: midline Location: L2-3 Injection technique: single-shot Needle Needle type: Sprotte  Needle gauge: 24 G Needle length: 9 cm Assessment Sensory level: T4

## 2018-05-05 NOTE — Discharge Instructions (Signed)

## 2018-05-05 NOTE — Transfer of Care (Signed)
Immediate Anesthesia Transfer of Care Note  Patient: Jeremy [name]  Procedure(s) Performed: LEFT TOTAL HIP ARTHROPLASTY ANTERIOR APPROACH (Left Hip)  Patient Location: PACU  Anesthesia Type:MAC and Spinal  Level of Consciousness: drowsy and patient cooperative  Airway & Oxygen Therapy: Patient Spontanous Breathing and Patient connected to face mask oxygen  Post-op Assessment: Report given to RN and Post -op Vital signs reviewed and stable  Post vital signs: Reviewed and stable  Last Vitals:  Vitals Value Taken Time  BP 92/53 05/05/2018 12:53 PM  Temp    Pulse 75 05/05/2018 12:54 PM  Resp 16 05/05/2018 12:54 PM  SpO2 95 % 05/05/2018 12:54 PM  Vitals shown include unvalidated device data.  Last Pain:  Vitals:   05/05/18 0843  TempSrc:   PainSc: 0-No pain      Patients Stated Pain Goal: 5 (59/74/16 3845)  Complications: No apparent anesthesia complications

## 2018-05-05 NOTE — Anesthesia Procedure Notes (Deleted)
Spinal

## 2018-05-05 NOTE — Anesthesia Preprocedure Evaluation (Addendum)
Anesthesia Evaluation  Patient identified by MRN, date of birth, ID band Patient awake    Reviewed: Allergy & Precautions, H&P , NPO status , Patient's Chart, lab work & pertinent test results, reviewed documented beta blocker date and time   History of Anesthesia Complications (+) PONV and history of anesthetic complications  Airway Mallampati: I  TM Distance: >3 FB Neck ROM: full    Dental no notable dental hx. (+) Teeth Intact, Poor Dentition   Pulmonary neg pulmonary ROS, Current Smoker,    Pulmonary exam normal breath sounds clear to auscultation       Cardiovascular Exercise Tolerance: Good hypertension, Pt. on medications negative cardio ROS   Rhythm:regular Rate:Normal     Neuro/Psych Anxiety negative neurological ROS     GI/Hepatic Neg liver ROS, hiatal hernia,   Endo/Other  negative endocrine ROS  Renal/GU negative Renal ROS  negative genitourinary   Musculoskeletal  (+) Arthritis , Osteoarthritis,    Abdominal   Peds  Hematology negative hematology ROS (+)   Anesthesia Other Findings   Reproductive/Obstetrics negative OB ROS                            Anesthesia Physical Anesthesia Plan  ASA: III  Anesthesia Plan: Spinal   Post-op Pain Management:    Induction:   PONV Risk Score and Plan: 2 and Ondansetron and Treatment may vary due to age or medical condition  Airway Management Planned: Nasal Cannula, Natural Airway and Mask  Additional Equipment:   Intra-op Plan:   Post-operative Plan:   Informed Consent: I have reviewed the patients History and Physical, chart, labs and discussed the procedure including the risks, benefits and alternatives for the proposed anesthesia with the patient or authorized representative who has indicated his/her understanding and acceptance.   Dental Advisory Given  Plan Discussed with: CRNA, Anesthesiologist and  Surgeon  Anesthesia Plan Comments: (  )       Anesthesia Quick Evaluation

## 2018-05-06 ENCOUNTER — Encounter (HOSPITAL_COMMUNITY): Payer: Self-pay | Admitting: Orthopedic Surgery

## 2018-05-06 NOTE — Evaluation (Signed)
Physical Therapy Evaluation Patient Details Name: Jeremy [name] MRN: [id number] DOB: 06-24-1951 Today's Date: 05/06/2018   History of Present Illness  Pt is a 67 y/o male s/p L THA, direct anterior approach. PMH including but not limited to daily ETOH use.  Clinical Impression  Pt presented supine in bed with HOB elevated, awake and willing to participate in therapy session. Pt's spouse present throughout session as well. Prior to admission, pt reported that he ambulated with [facility] and was independent with ADLs. Pt lives with his spouse who will be able to provide 24/7 supervision/assistance upon d/c. Pt currently able to perform bed mobility with min guard, transfers with min guard, ambulate in hallway with RW and min guard and successfully completed stair training with cueing and min guard for safety. Pt moving very well this session. Plan is for him to d/c home today. Pt is ready for d/c from PT perspective at this time. PT will continue to follow pt acutely to progress mobility as tolerated.  Pt would continue to benefit from skilled physical therapy services at this time while admitted and after d/c to address the below listed limitations in order to improve overall safety and independence with functional mobility.     Follow Up Recommendations Home health PT;Supervision - Intermittent    Equipment Recommendations  None recommended by PT;Other (comment)(pt reported having all necessary DME at home)    Recommendations for Other Services       Precautions / Restrictions Precautions Precautions: Fall Restrictions Weight Bearing Restrictions: Yes LLE Weight Bearing: Weight bearing as tolerated      Mobility  Bed Mobility Overal bed mobility: Needs Assistance Bed Mobility: Supine to Sit     Supine to sit: Min guard     General bed mobility comments: increased time and effort, min guard for safety  Transfers Overall transfer level: Needs assistance Equipment used: Rolling  walker (2 wheeled) Transfers: Sit to/from Stand Sit to Stand: Min guard         General transfer comment: cue for safe hand placement, min guard for safety  Ambulation/Gait Ambulation/Gait assistance: Min guard Ambulation Distance (Feet): 150 Feet Assistive device: Rolling walker (2 wheeled) Gait Pattern/deviations: Step-through pattern;Decreased step length - right;Decreased step length - left;Decreased stride length Gait velocity: decreased Gait velocity interpretation: 1.31 - 2.62 ft/sec, indicative of limited community ambulator General Gait Details: pt with mild instability but no overt LOB or need for physical assistance, min guard for safety  Stairs Stairs: Yes Stairs assistance: Min guard Stair Management: Two rails;Step to pattern;Forwards Number of Stairs: 2(x2 bouts) General stair comments: cueing for sequencing, min guard for safety; pt reported feeling confident with stair negotiation at end of training  Wheelchair Mobility    Modified Rankin (Stroke Patients Only)       Balance Overall balance assessment: Needs assistance Sitting-balance support: Feet supported Sitting balance-[name]: Good     Standing balance support: During functional activity;No upper extremity supported Standing balance-[name]: Fair                               Pertinent Vitals/Pain Pain Assessment: Faces Faces Pain Scale: Hurts a little bit Pain Location: L hip Pain Descriptors / Indicators: Sore Pain Intervention(s): Monitored during session;Repositioned;Patient requesting pain meds-RN notified;RN gave pain meds during session    Home Living Family/patient expects to be discharged to:: Private residence Living Arrangements: Spouse/significant other Available Help at Discharge: Family;Available 24 hours/day Type  of Home: House Home Access: Stairs to enter Entrance Stairs-Rails: Right Entrance Stairs-Number of Steps: 3 Home Layout: One level Home  Equipment: Cane - single point;Shower seat;Walker - 2 wheels;Cane - quad      Prior Function Level of Independence: Independent with assistive device(s)         Comments: pt ambulates with SPC     Hand Dominance        Extremity/Trunk Assessment   Upper Extremity Assessment Upper Extremity Assessment: Overall WFL for tasks assessed    Lower Extremity Assessment Lower Extremity Assessment: LLE deficits/detail LLE Deficits / Details: pt with decreased strength and AROM limitations secondary to post-op pain. Pt with sensation grossly intact.       Communication   Communication: No difficulties  Cognition Arousal/Alertness: Awake/alert Behavior During Therapy: WFL for tasks assessed/performed Overall Cognitive Status: Impaired/Different from baseline Area of Impairment: Attention;Safety/judgement                   Current Attention Level: Sustained     Safety/Judgement: Decreased awareness of deficits;Decreased awareness of safety     General Comments: wife present, pt appears at baseline      General Comments      Exercises     Assessment/Plan    PT Assessment Patient needs continued PT services  PT Problem List Decreased strength;Decreased range of motion;Decreased activity tolerance;Decreased balance;Decreased mobility;Decreased coordination;Decreased knowledge of use of DME;Decreased safety awareness;Decreased knowledge of precautions;Pain       PT Treatment Interventions DME instruction;Gait training;Stair training;Therapeutic activities;Therapeutic exercise;Functional mobility training;Balance training;Neuromuscular re-education;Patient/family education    PT Goals (Current goals can be found in the Care Plan section)  Acute Rehab PT Goals Patient Stated Goal: return home today PT Goal Formulation: With patient/family Time For Goal Achievement: 05/20/18 Potential to Achieve Goals: Good    Frequency 7X/week   Barriers to discharge         Co-evaluation               AM-PAC PT "6 Clicks" Daily Activity  Outcome Measure Difficulty turning over in bed (including adjusting bedclothes, sheets and blankets)?: None Difficulty moving from lying on back to sitting on the side of the bed? : A Little Difficulty sitting down on and standing up from a chair with arms (e.g., wheelchair, bedside commode, etc,.)?: Unable Help needed moving to and from a bed to chair (including a wheelchair)?: A Little Help needed walking in hospital room?: A Little Help needed climbing 3-5 steps with a railing? : A Little 6 Click Score: 17    End of Session Equipment Utilized During Treatment: Gait belt Activity Tolerance: Patient tolerated treatment well Patient left: in chair;with call bell/phone within reach;with family/visitor present Nurse Communication: Mobility status PT Visit Diagnosis: Other abnormalities of gait and mobility (R26.89)    Time: 6578-4696 PT Time Calculation (min) (ACUTE ONLY): 42 min   Charges:   PT Evaluation $PT Eval Moderate Complexity: 1 Mod PT Treatments $Gait Training: 23-37 mins   PT G Codes:        Raymond, PT, DPT [place] 05/06/2018, 10:38 AM

## 2018-05-06 NOTE — Evaluation (Signed)
Occupational Therapy Evaluation and Discharge Patient Details Name: Jeremy [name] MRN: [id number] DOB: 24-Oct-1951 Today's Date: 05/06/2018    History of Present Illness Pt is a 67 y/o male s/p L THA, direct anterior approach. PMH including but not limited to daily ETOH use.   Clinical Impression   Patient evaluated by Occupational Therapy with no further acute OT needs identified. Pt currently requiring min guard assist for LB ADL and tub/shower transfers. He is able to complete toileting tasks with supervision and cues for safety. His wife demonstrates the ability to provide all necessary assistance and will be able to provide 24 hour supervision/assistance at home. All education has been completed and the patient has no further questions. See below for any follow-up Occupational Therapy or equipment needs. OT to sign off. Thank you for referral.      Follow Up Recommendations  Supervision/Assistance - 24 hour;No OT follow up    Equipment Recommendations  None recommended by OT    Recommendations for Other Services       Precautions / Restrictions Precautions Precautions: Fall Restrictions Weight Bearing Restrictions: Yes LLE Weight Bearing: Weight bearing as tolerated      Mobility Bed Mobility Overal bed mobility: Needs Assistance Bed Mobility: Supine to Sit     Supine to sit: Min guard     General bed mobility comments: OOB in recliner on my arrival.   Transfers Overall transfer level: Needs assistance Equipment used: Rolling walker (2 wheeled) Transfers: Sit to/from Stand Sit to Stand: Supervision         General transfer comment: Supervision for safety. Cues for hand placement.     Balance Overall balance assessment: Needs assistance Sitting-balance support: Feet supported Sitting balance-[name]: Good     Standing balance support: During functional activity;No upper extremity supported Standing balance-[name]: Fair Standing balance  comment: Able to statically stand without UE support.                            ADL either performed or assessed with clinical judgement   ADL Overall ADL's : Needs assistance/impaired Eating/Feeding: Set up;Sitting   Grooming: Supervision/safety;Standing   Upper Body Bathing: Set up;Sitting   Lower Body Bathing: Min guard;Sit to/from stand   Upper Body Dressing : Set up;Sitting   Lower Body Dressing: Min guard;Sit to/from stand   Toilet Transfer: Supervision/safety;Ambulation;RW;Grab bars Toilet Transfer Details (indicate cue type and reason): Very little reliance on RW Toileting- Clothing Manipulation and Hygiene: Supervision/safety;Sit to/from stand   Tub/ Shower Transfer: Min guard;Ambulation;Tub transfer;Rolling walker Tub/Shower Transfer Details (indicate cue type and reason): Simulated in room Functional mobility during ADLs: Supervision/safety;Rolling walker General ADL Comments: Pt educated concerning compensatory LB ADL strategies, use of shower seat for safety initially during showering, and need to slow down at times for safety.      Vision Patient Visual Report: No change from baseline Vision Assessment?: No apparent visual deficits     Perception     Praxis      Pertinent Vitals/Pain Pain Assessment: Faces Faces Pain Scale: Hurts a little bit Pain Location: L hip Pain Descriptors / Indicators: Sore Pain Intervention(s): Monitored during session;Repositioned     Hand Dominance     Extremity/Trunk Assessment Upper Extremity Assessment Upper Extremity Assessment: Overall WFL for tasks assessed   Lower Extremity Assessment Lower Extremity Assessment: LLE deficits/detail LLE Deficits / Details: Decreased strength and ROM as expected post-operatively.        Communication  Communication Communication: No difficulties   Cognition Arousal/Alertness: Awake/alert Behavior During Therapy: WFL for tasks assessed/performed Overall Cognitive  Status: Impaired/Different from baseline Area of Impairment: Attention;Safety/judgement                   Current Attention Level: Selective     Safety/Judgement: Decreased awareness of deficits;Decreased awareness of safety     General Comments: Pt likely close to baseline but very impulsive with movement.    General Comments  Wife present and engaged in session. Pt preparing to leave hospital on my arrival.     Exercises     Shoulder Instructions      Home Living Family/patient expects to be discharged to:: Private residence Living Arrangements: Spouse/significant other Available Help at Discharge: Family;Available 24 hours/day Type of Home: House Home Access: Stairs to enter [employer] of Steps: 3 Entrance Stairs-Rails: Right Home Layout: One level     Bathroom Shower/Tub: Teacher, early years/pre: Standard(with counter next to commode)     Home Equipment: Cane - single point;Shower seat;Walker - 2 wheels;Cane - quad          Prior Functioning/Environment Level of Independence: Independent with assistive device(s)        Comments: pt ambulates with SPC        OT Problem List: Decreased strength;Decreased range of motion;Decreased activity tolerance;Impaired balance (sitting and/or standing);Decreased safety awareness;Decreased knowledge of use of DME or AE;Decreased knowledge of precautions;Pain      OT Treatment/Interventions:      OT Goals(Current goals can be found in the care plan section) Acute Rehab OT Goals Patient Stated Goal: return home today OT Goal Formulation: With patient/family  OT Frequency:     Barriers to D/C:            Co-evaluation              AM-PAC PT "6 Clicks" Daily Activity     Outcome Measure Help from another person eating meals?: None Help from another person taking care of personal grooming?: None Help from another person toileting, which includes using toliet, bedpan, or urinal?:  None Help from another person bathing (including washing, rinsing, drying)?: A Little Help from another person to put on and taking off regular upper body clothing?: None Help from another person to put on and taking off regular lower body clothing?: A Little 6 Click Score: 22   End of Session    Activity Tolerance:   Patient left:    OT Visit Diagnosis: Other abnormalities of gait and mobility (R26.89);Pain Pain - Right/Left: Left Pain - part of body: Hip                Time: 1050-1106 OT Time Calculation (min): 16 min Charges:  OT General Charges $OT Visit: 1 Visit OT Evaluation $OT Eval Low Complexity: 1 Low G-Codes:     Norman Herrlich, MS OTR/L  Pager: [place] [facility] 05/06/2018, 11:55 AM

## 2018-05-06 NOTE — Discharge Summary (Signed)
Discharge Summary  Patient ID: Jeremy [name] MRN: [id number] DOB/AGE: Nov 29, 1951 67 y.o.  Admit date: 05/05/2018 Discharge date: 05/06/2018  Admission Diagnoses:  Primary osteoarthritis of left hip  Discharge Diagnoses:  Principal Problem:   Primary osteoarthritis of left hip Active Problems:   Alcohol use   Essential hypertension   History of lumbar surgery   History of cervical spinal surgery   Tobacco abuse disorder   Primary osteoarthritis of hip   Past Medical History:  Diagnosis Date  . Alcohol use    daily  . Anxiety    hx (05/05/2018)  . Arthritis    "all over" (05/05/2018)  . Asthma   . Chronic bronchitis ([place])   . Dyspnea   . Family history of adverse reaction to anesthesia    "mom would have nausea after OR"   . GERD (gastroesophageal reflux disease)   . History of hiatal hernia   . Hypertension   . Pneumonia X 1  . PONV (postoperative nausea and vomiting)   . Seasonal allergies    "spring, summer, fall"    Surgeries: Procedure(s): LEFT TOTAL HIP ARTHROPLASTY ANTERIOR APPROACH on 05/05/2018   Consultants (if any):   Discharged Condition: Improved  Hospital Course: Jeremy [name] is an 67 y.o. male who was admitted 05/05/2018 with a diagnosis of Primary osteoarthritis of left hip and went to the operating room on 05/05/2018 and underwent the above named procedures.    He was given perioperative antibiotics:  Anti-infectives (From admission, onward)   Start     Dose/Rate Route Frequency Ordered Stop   05/05/18 1900  ceFAZolin (ANCEF) IVPB 1 g/50 mL premix     1 g 100 mL/hr over 30 Minutes Intravenous Every 6 hours 05/05/18 1824 05/06/18 0133   05/05/18 0815  ceFAZolin (ANCEF) IVPB 2g/100 mL premix     2 g 200 mL/hr over 30 Minutes Intravenous On call to O.R. 05/05/18 0802 05/05/18 1056    .  He was given sequential compression devices, early ambulation, and aspirin for DVT prophylaxis.  He benefited maximally from the hospital stay and there were  no complications.    Recent vital signs:  Vitals:   05/05/18 2100 05/06/18 0435  BP: 117/69 (!) 147/84  Pulse: 83 74  Resp: 15 19  Temp: 98.7 F (37.1 C) 98.5 F (36.9 C)  SpO2: 97% 99%    Recent laboratory studies:  Lab Results  Component Value Date   HGB 17.5 (H) 04/24/2018   HGB 18.4 (H) 04/10/2018   HGB 18.7 (H) 08/07/2017   Lab Results  Component Value Date   WBC 8.5 04/24/2018   PLT 290 04/24/2018   No results found for: INR Lab Results  Component Value Date   NA 130 (L) 04/24/2018   K 3.9 04/24/2018   CL 93 (L) 04/24/2018   CO2 27 04/24/2018   BUN 7 04/24/2018   CREATININE 0.87 04/24/2018   GLUCOSE 95 04/24/2018    Discharge Medications:   Allergies as of 05/06/2018      Reactions   Penicillins Other (See Comments)   Has patient had a PCN reaction causing immediate rash, facial/tongue/throat swelling, SOB or lightheadedness with hypotension:##Yes# but pt states he is ok with amoxicillin. Has patient had a PCN reaction causing severe rash involving mucus membranes or skin necrosis: No Has patient had a PCN reaction that required hospitalization: No Has patient had a PCN reaction occurring within the last 10 years: No If all of the above answers are "NO",  then may proceed with Cephalosporin use.      Medication List    STOP taking these medications   ciprofloxacin 500 MG tablet Commonly known as:  CIPRO     TAKE these medications   amLODipine 10 MG tablet Commonly known as:  NORVASC Take 10 mg by mouth daily.   ASPERCREME LIDOCAINE 4 % Liqd Generic drug:  Lidocaine HCl Apply 1 application topically daily. HIP PAIN   aspirin EC 81 MG tablet Take 1 tablet (81 mg total) by mouth 2 (two) times daily. For DVT prophylaxis   baclofen 10 MG tablet Commonly known as:  LIORESAL Take 1 tablet (10 mg total) by mouth 3 (three) times daily as needed for muscle spasms.   cholecalciferol 1000 units tablet Commonly known as:  VITAMIN D Take 1,000 Units by  mouth daily.   diltiazem 240 MG 24 hr capsule Commonly known as:  CARDIZEM CD Take 240 mg by mouth daily.   docusate sodium 100 MG capsule Commonly known as:  COLACE Take 1 capsule (100 mg total) by mouth 2 (two) times daily. To prevent constipation while taking pain medication.   fluticasone 50 MCG/ACT nasal spray Commonly known as:  FLONASE Place 1 spray into both nostrils daily.   HYDROcodone-acetaminophen 5-325 MG tablet Commonly known as:  NORCO Take 1-2 tablets by mouth every 6 (six) hours as needed for moderate pain.   lisinopril 40 MG tablet Commonly known as:  PRINIVIL,ZESTRIL Take 40 mg by mouth daily.   meloxicam 7.5 MG tablet Commonly known as:  MOBIC Take 7.5 mg by mouth daily.   omeprazole 20 MG tablet Commonly known as:  PRILOSEC OTC Take 20 mg by mouth daily.   ondansetron 4 MG tablet Commonly known as:  ZOFRAN Take 1 tablet (4 mg total) by mouth every 8 (eight) hours as needed for nausea or vomiting.   VENTOLIN HFA 108 (90 Base) MCG/ACT inhaler Generic drug:  albuterol Inhale 1-2 puffs into the lungs every 6 (six) hours as needed for wheezing or shortness of breath.       Diagnostic Studies: Dg C-arm 1-60 Min  Result Date: 05/05/2018 CLINICAL DATA:  Anterior approach LEFT hip replacement. EXAM: DG C-ARM 61-120 MIN; OPERATIVE LEFT HIP WITH PELVIS COMPARISON:  None. FINDINGS: Two intraoperative fluoroscopic views show LEFT hip arthroplasty hardware. Hardware appears appropriately positioned. Fluoroscopy was provided for 11 seconds. IMPRESSION: LEFT hip arthroplasty hardware in place. No evidence of surgical complicating feature. Electronically Signed   By: Franki Cabot M.D.   On: 05/05/2018 12:31   Dg Hip Operative Unilat W Or W/o Pelvis Left  Result Date: 05/05/2018 CLINICAL DATA:  Anterior approach LEFT hip replacement. EXAM: DG C-ARM 61-120 MIN; OPERATIVE LEFT HIP WITH PELVIS COMPARISON:  None. FINDINGS: Two intraoperative fluoroscopic views show LEFT  hip arthroplasty hardware. Hardware appears appropriately positioned. Fluoroscopy was provided for 11 seconds. IMPRESSION: LEFT hip arthroplasty hardware in place. No evidence of surgical complicating feature. Electronically Signed   By: Franki Cabot M.D.   On: 05/05/2018 12:31    Disposition: Discharge disposition: 01-Home or Self Care       Discharge Instructions    Discharge patient   Complete by:  As directed    After therapy evaluations   Discharge disposition:  01-Home or Self Care   Discharge patient date:  05/06/2018      Follow-up Information    [name], MD Follow up.   Specialty:  Orthopedic Surgery Contact information: [address]  [place] [address] [phone]            Signed: Prudencio Burly III PA-C 05/06/2018, 7:32 AM

## 2018-05-06 NOTE — Progress Notes (Signed)
    Subjective: Patient reports pain as mild.  Tolerating diet.  Urinating.   No CP, SOB.  Not yet OOB.    Objective:   VITALS:   Vitals:   05/05/18 1535 05/05/18 1600 05/05/18 2100 05/06/18 0435  BP: 120/61 120/76 117/69 (!) 147/84  Pulse: (!) 59 67 83 74  Resp: 14 20 15 19   Temp:  97.6 F (36.4 C) 98.7 F (37.1 C) 98.5 F (36.9 C)  TempSrc:  Oral Oral Oral  SpO2: 94% 95% 97% 99%  Weight:      Height:       CBC Latest Ref Rng & Units 04/24/2018 04/10/2018 08/07/2017  WBC 4.0 - 10.5 K/uL 8.5 9.2 11.1(H)  Hemoglobin 13.0 - 17.0 g/dL 17.5(H) 18.4(H) 18.7(H)  Hematocrit 39.0 - 52.0 % 50.2 51.5 51.2  Platelets 150 - 400 K/uL 290 245 308   BMP Latest Ref Rng & Units 04/24/2018 04/10/2018 08/07/2017  Glucose 65 - 99 mg/dL 95 100(H) 99  BUN 6 - 20 mg/dL 7 7 6   Creatinine 0.61 - 1.24 mg/dL 0.87 0.77 0.69  Sodium 135 - 145 mmol/L 130(L) 135 136  Potassium 3.5 - 5.1 mmol/L 3.9 4.0 2.5(LL)  Chloride 101 - 111 mmol/L 93(L) 98(L) 94(L)  CO2 22 - 32 mmol/L 27 26 29   Calcium 8.9 - 10.3 mg/dL 9.0 9.4 10.0   Intake/Output      06/04 0701 - 06/05 0700 06/05 0701 - 06/06 0700   P.O. 240    I.V. (mL/kg) 1700 (21.5)    Total Intake(mL/kg) 1940 (24.5)    Urine (mL/kg/hr) 1850    Blood 150    Total Output 2000    Net -60            Physical Exam: General: NAD.  Upright in bed.  Calm, conversant.  Wife at bedside. Resp: No increased wob Cardio: regular rate and rhythm ABD soft Neurologically intact MSK Neurovascularly intact Sensation intact distally Intact pulses distally Dorsiflexion/Plantar flexion intact Incision: dressing C/D/I   Assessment: 1 Day Post-Op  S/P Procedure(s) (LRB): LEFT TOTAL HIP ARTHROPLASTY ANTERIOR APPROACH (Left) by Dr. Ernesta Amble. Percell Miller on 05/05/2018  Principal Problem:   Primary osteoarthritis of left hip Active Problems:   Alcohol use   Essential hypertension   History of lumbar surgery   History of cervical spinal surgery   Tobacco abuse  disorder   Primary osteoarthritis of hip  Primary osteoarthritis, status post total hip arthroplasty Doing well Eating, drinking, and voiding Pain controlled Doing bed exercises on his own-has not yet been up with physical therapy.  Plan: Advance diet Up with therapy D/C IV fluids Incentive Spirometry Apply ice Smoking cessation discussed   Weight Bearing: Weight Bearing as Tolerated (WBAT) LLE Dressings: Maintain Mepilex.   VTE prophylaxis: Aspirin, SCDs, ambulation Dispo: Home today after therapy/mobilizing.   [name], PA-C 05/06/2018, 7:29 AM

## 2018-05-20 DIAGNOSIS — M1612 Unilateral primary osteoarthritis, left hip: Secondary | ICD-10-CM | POA: Diagnosis not present

## 2018-06-01 DIAGNOSIS — I10 Essential (primary) hypertension: Secondary | ICD-10-CM | POA: Diagnosis not present

## 2018-06-01 DIAGNOSIS — Z0001 Encounter for general adult medical examination with abnormal findings: Secondary | ICD-10-CM | POA: Diagnosis not present

## 2018-06-01 DIAGNOSIS — M1991 Primary osteoarthritis, unspecified site: Secondary | ICD-10-CM | POA: Diagnosis not present

## 2018-06-01 DIAGNOSIS — Z1389 Encounter for screening for other disorder: Secondary | ICD-10-CM | POA: Diagnosis not present

## 2018-06-01 DIAGNOSIS — G894 Chronic pain syndrome: Secondary | ICD-10-CM | POA: Diagnosis not present

## 2018-06-01 DIAGNOSIS — Z6824 Body mass index (BMI) 24.0-24.9, adult: Secondary | ICD-10-CM | POA: Diagnosis not present

## 2018-06-02 DIAGNOSIS — Z13220 Encounter for screening for lipoid disorders: Secondary | ICD-10-CM | POA: Diagnosis not present

## 2018-06-02 DIAGNOSIS — Z125 Encounter for screening for malignant neoplasm of prostate: Secondary | ICD-10-CM | POA: Diagnosis not present

## 2018-06-02 DIAGNOSIS — Z6824 Body mass index (BMI) 24.0-24.9, adult: Secondary | ICD-10-CM | POA: Diagnosis not present

## 2018-06-02 DIAGNOSIS — Z1389 Encounter for screening for other disorder: Secondary | ICD-10-CM | POA: Diagnosis not present

## 2018-06-02 DIAGNOSIS — Z0000 Encounter for general adult medical examination without abnormal findings: Secondary | ICD-10-CM | POA: Diagnosis not present

## 2018-06-02 DIAGNOSIS — Z79899 Other long term (current) drug therapy: Secondary | ICD-10-CM | POA: Diagnosis not present

## 2018-06-02 DIAGNOSIS — Z0001 Encounter for general adult medical examination with abnormal findings: Secondary | ICD-10-CM | POA: Diagnosis not present

## 2018-06-17 DIAGNOSIS — M1612 Unilateral primary osteoarthritis, left hip: Secondary | ICD-10-CM | POA: Diagnosis not present

## 2018-07-28 DIAGNOSIS — H6123 Impacted cerumen, bilateral: Secondary | ICD-10-CM | POA: Diagnosis not present

## 2018-07-28 DIAGNOSIS — E663 Overweight: Secondary | ICD-10-CM | POA: Diagnosis not present

## 2018-07-28 DIAGNOSIS — Z6825 Body mass index (BMI) 25.0-25.9, adult: Secondary | ICD-10-CM | POA: Diagnosis not present

## 2018-09-02 DIAGNOSIS — R69 Illness, unspecified: Secondary | ICD-10-CM | POA: Diagnosis not present

## 2019-04-30 IMAGING — DX DG HIP (WITH OR WITHOUT PELVIS) 5+V BILAT
5 series · 5 of 5 positions shown · non-contrast
Comparison: None

CLINICAL DATA: BILATERAL chronic hip pain, no known injury, lower
back surgery x 2

EXAM:
DG HIP (WITH OR WITHOUT PELVIS) 5+V BILAT

[pelvis ap]
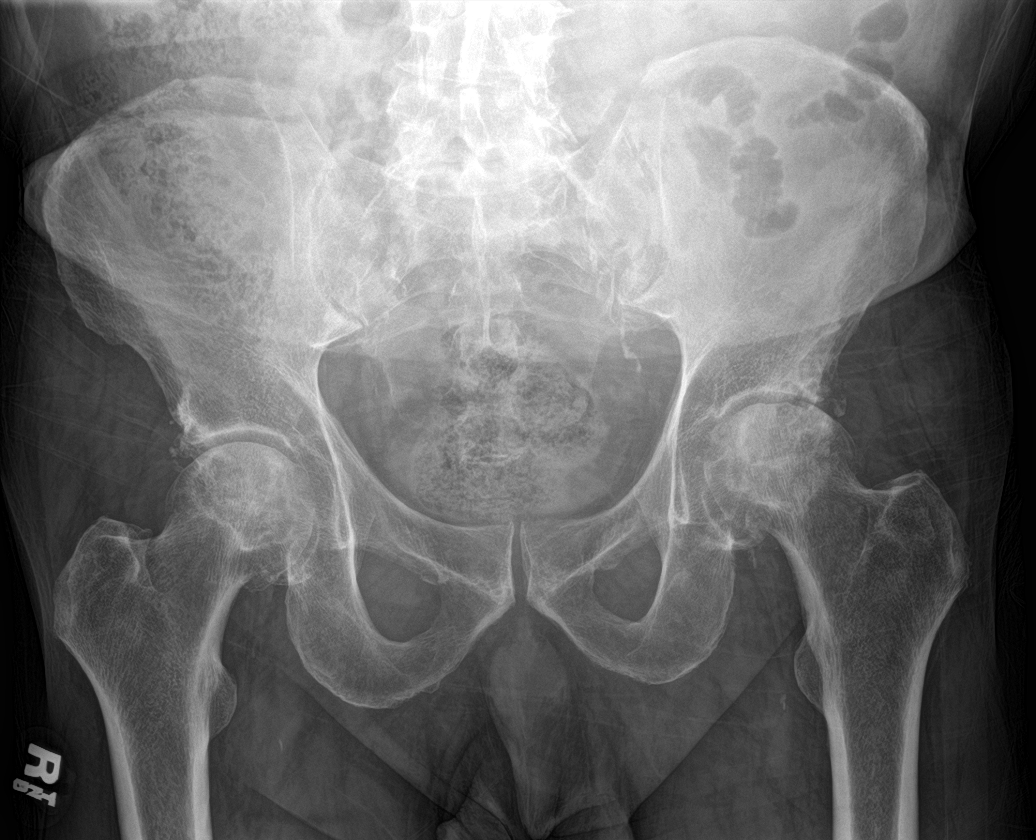

[hip ap (1 of 2)]
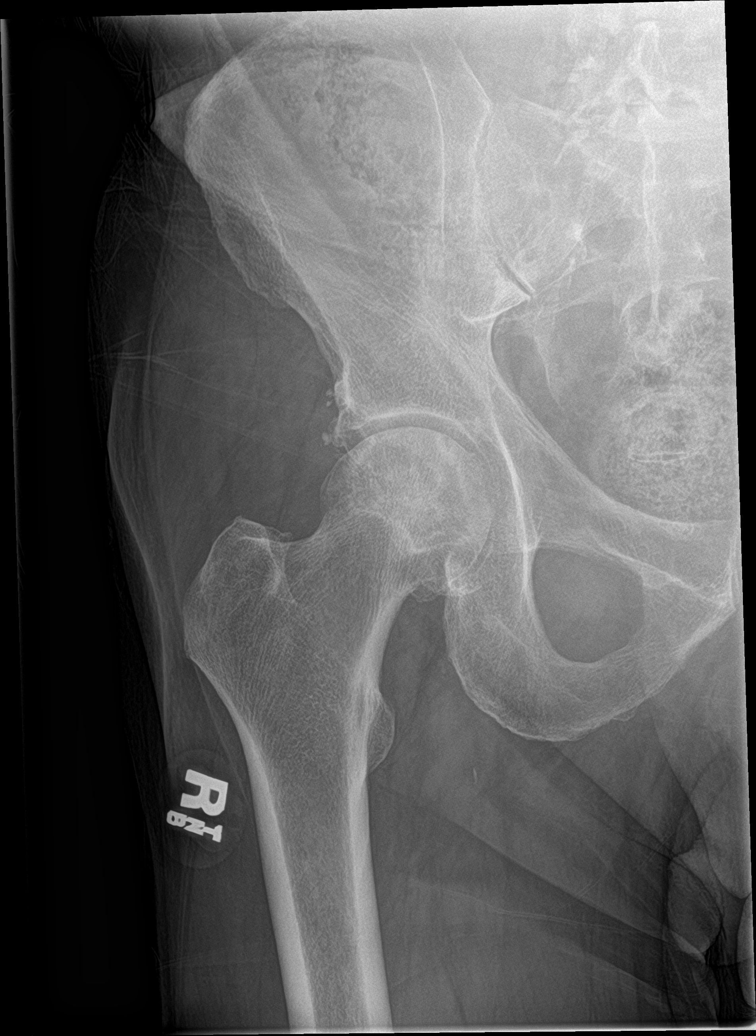

[hip lat (1 of 2)]
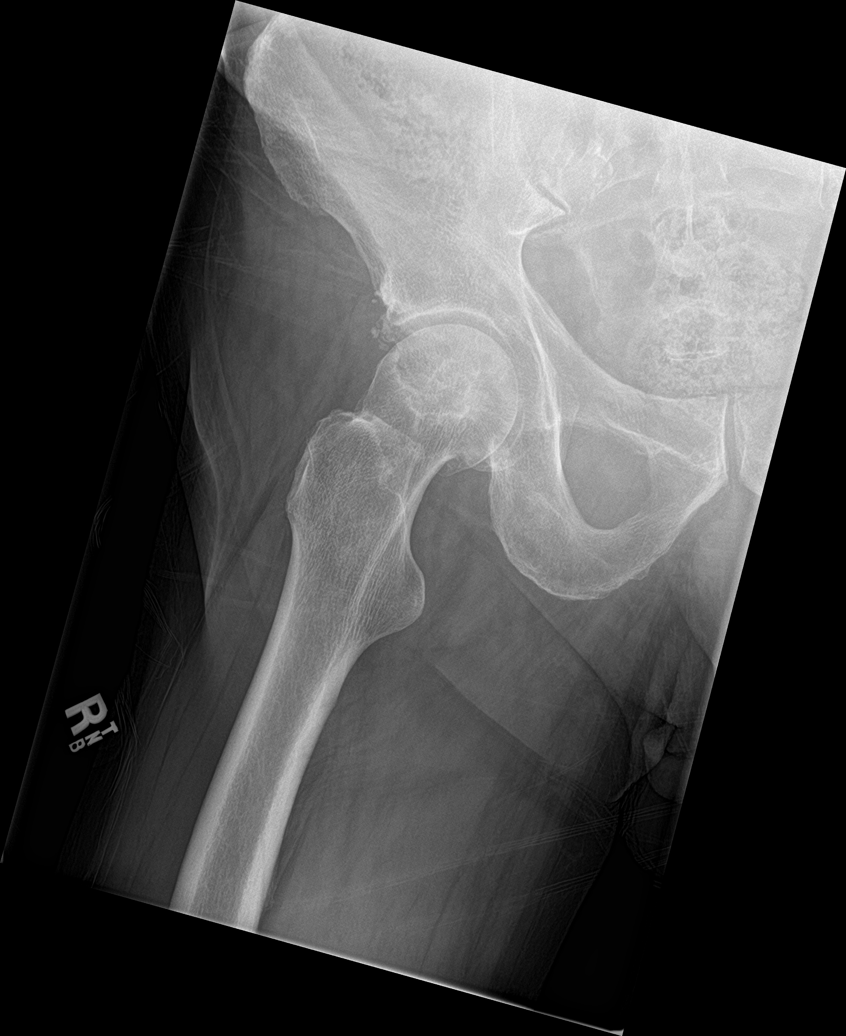

[hip ap (2 of 2)]
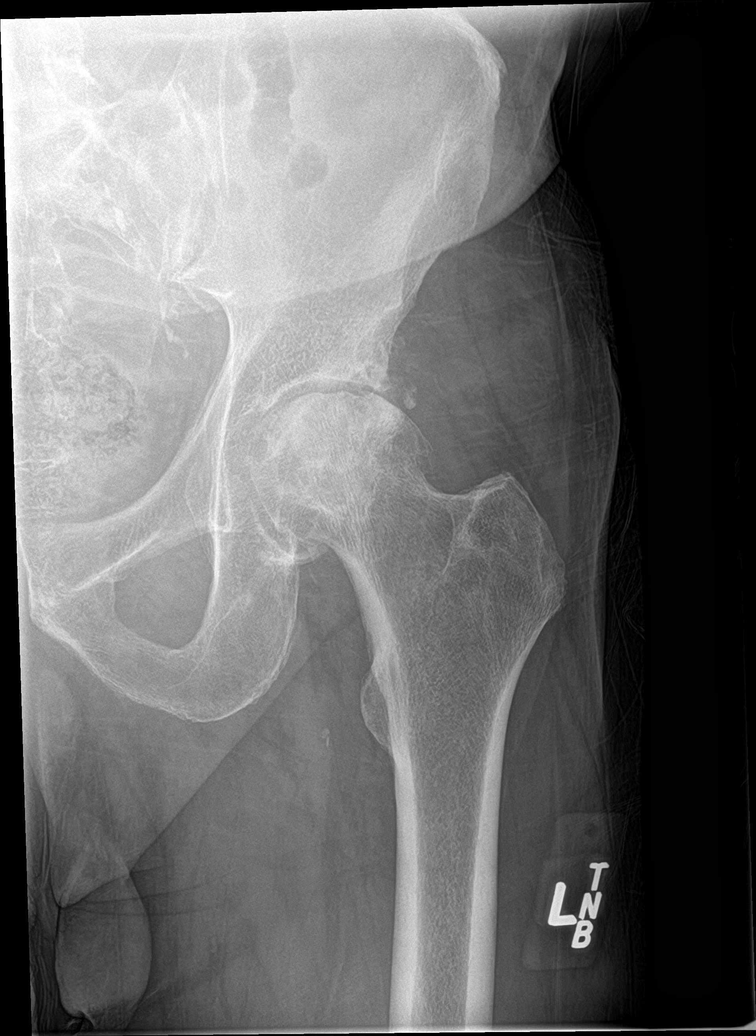

[hip lat (2 of 2)]
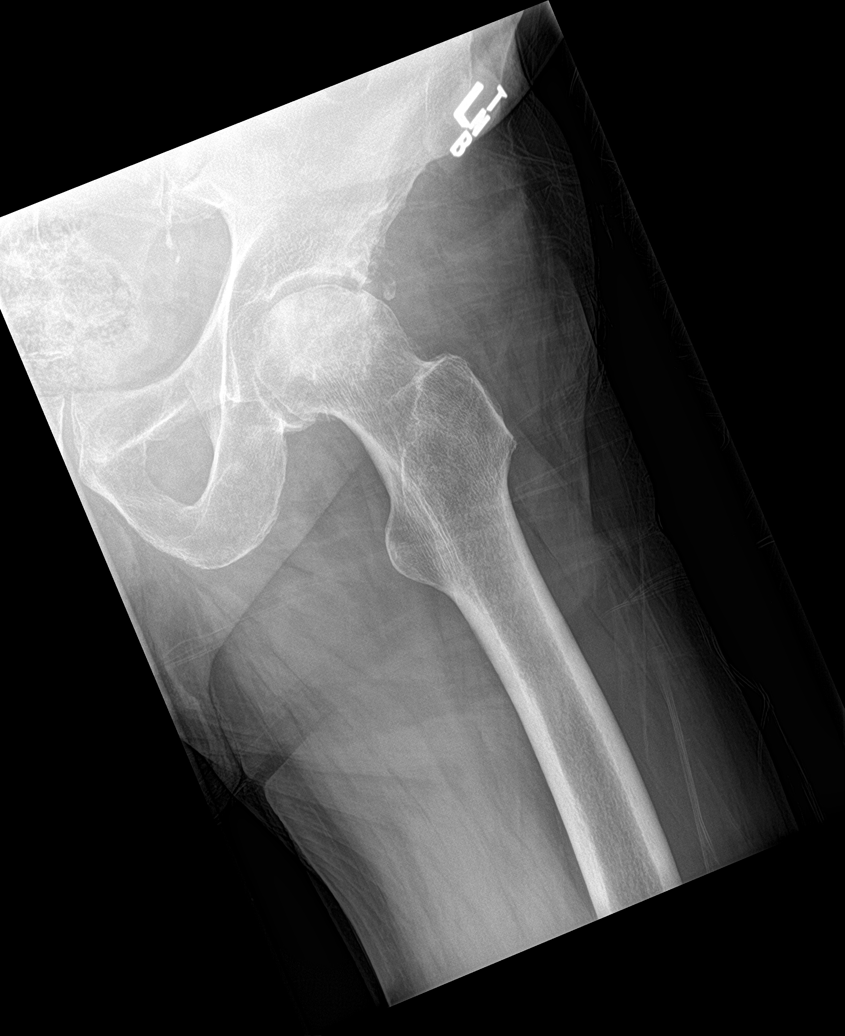

[5 of 5 positions shown; findings below may reference images not displayed]

FINDINGS: Diffuse osseous demineralization.

Mild narrowing of the LEFT hip joint.

Mild lucency and sclerosis within the femoral heads bilaterally
consistent with avascular necrosis.

Slight flattening of the superior LEFT femoral head consistent with
partial collapse.

Pelvis appears intact.

No additional fracture or dislocation.
IMPRESSION: BILATERAL avascular necrosis of the femoral heads with mild partial
collapse of the LEFT humeral head and secondary degenerative changes
of LEFT hip joint.

## 2019-06-16 DIAGNOSIS — M2550 Pain in unspecified joint: Secondary | ICD-10-CM | POA: Diagnosis not present

## 2019-06-16 DIAGNOSIS — Z6826 Body mass index (BMI) 26.0-26.9, adult: Secondary | ICD-10-CM | POA: Diagnosis not present

## 2019-06-16 DIAGNOSIS — G894 Chronic pain syndrome: Secondary | ICD-10-CM | POA: Diagnosis not present

## 2019-06-16 DIAGNOSIS — I10 Essential (primary) hypertension: Secondary | ICD-10-CM | POA: Diagnosis not present

## 2019-06-16 DIAGNOSIS — E748 Other specified disorders of carbohydrate metabolism: Secondary | ICD-10-CM | POA: Diagnosis not present

## 2019-06-16 DIAGNOSIS — Z1389 Encounter for screening for other disorder: Secondary | ICD-10-CM | POA: Diagnosis not present

## 2019-06-16 DIAGNOSIS — R7309 Other abnormal glucose: Secondary | ICD-10-CM | POA: Diagnosis not present

## 2019-06-16 DIAGNOSIS — M1991 Primary osteoarthritis, unspecified site: Secondary | ICD-10-CM | POA: Diagnosis not present

## 2019-06-16 DIAGNOSIS — E871 Hypo-osmolality and hyponatremia: Secondary | ICD-10-CM | POA: Diagnosis not present

## 2019-06-16 DIAGNOSIS — Z0000 Encounter for general adult medical examination without abnormal findings: Secondary | ICD-10-CM | POA: Diagnosis not present

## 2019-06-16 DIAGNOSIS — M5030 Other cervical disc degeneration, unspecified cervical region: Secondary | ICD-10-CM | POA: Diagnosis not present

## 2019-06-16 DIAGNOSIS — E663 Overweight: Secondary | ICD-10-CM | POA: Diagnosis not present

## 2019-07-06 IMAGING — RF DG HIP (WITH PELVIS) OPERATIVE*L*
1 series · 2 of 2 positions shown · non-contrast
Comparison: None.

CLINICAL DATA: Anterior approach LEFT hip replacement.

EXAM:
DG C-ARM 61-120 MIN; OPERATIVE LEFT HIP WITH PELVIS

[Series 1: run · 2 of 2 slices shown]
[im 1/2]
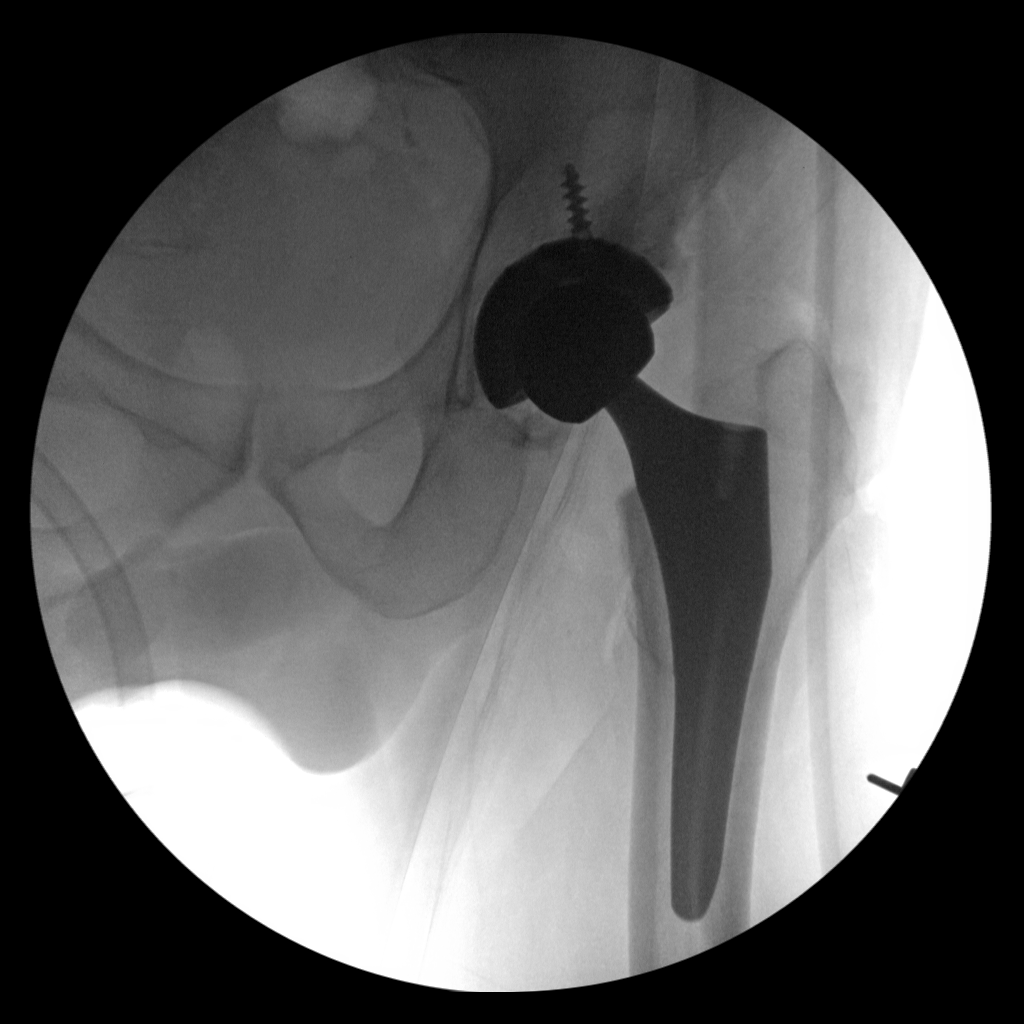
[im 2/2]
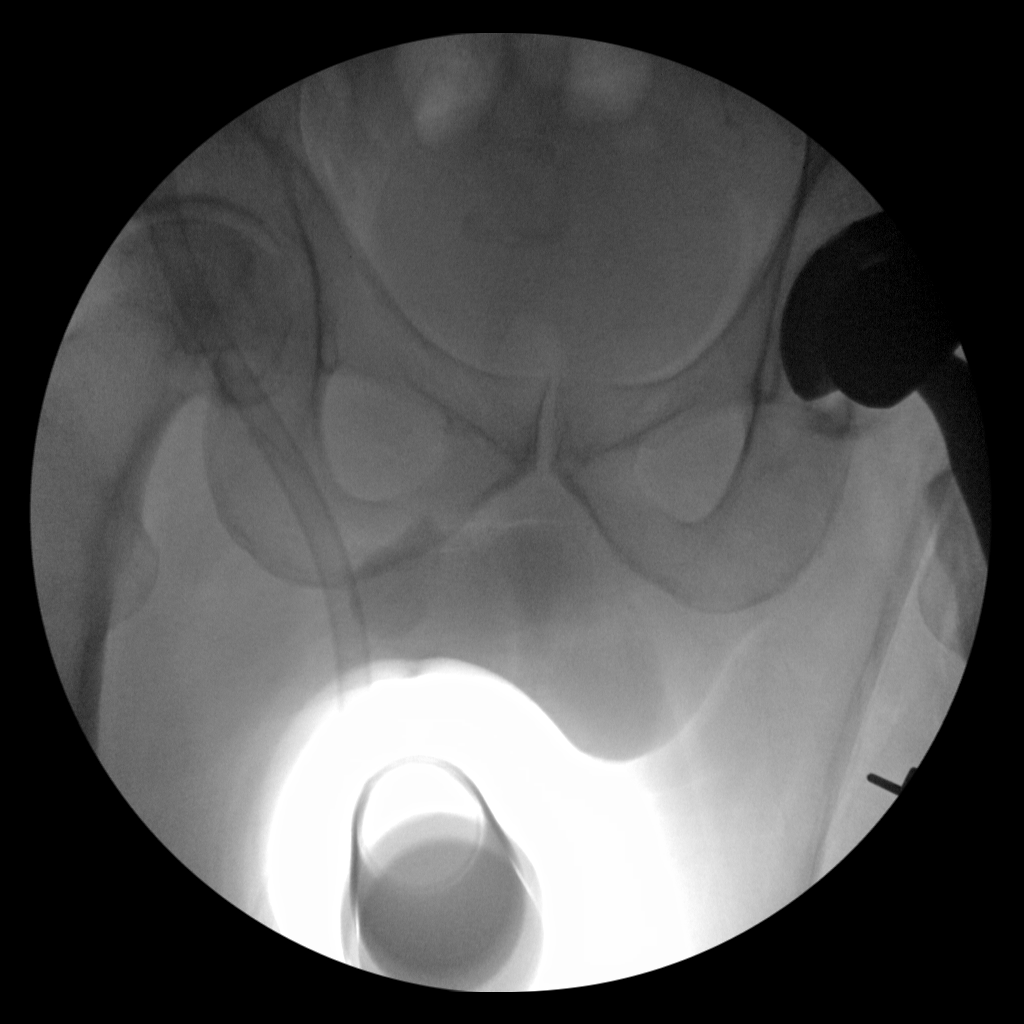

[2 of 2 positions shown; findings below may reference images not displayed]

FINDINGS: Two intraoperative fluoroscopic views show LEFT hip arthroplasty
hardware. Hardware appears appropriately positioned. Fluoroscopy was
provided for 11 seconds.
IMPRESSION: LEFT hip arthroplasty hardware in place. No evidence of surgical
complicating feature.

## 2019-08-02 DIAGNOSIS — M1991 Primary osteoarthritis, unspecified site: Secondary | ICD-10-CM | POA: Diagnosis not present

## 2019-08-02 DIAGNOSIS — G894 Chronic pain syndrome: Secondary | ICD-10-CM | POA: Diagnosis not present

## 2019-08-02 DIAGNOSIS — I10 Essential (primary) hypertension: Secondary | ICD-10-CM | POA: Diagnosis not present

## 2019-09-01 DIAGNOSIS — R7309 Other abnormal glucose: Secondary | ICD-10-CM | POA: Diagnosis not present

## 2019-09-01 DIAGNOSIS — E748 Other specified disorders of carbohydrate metabolism: Secondary | ICD-10-CM | POA: Diagnosis not present

## 2019-09-01 DIAGNOSIS — E871 Hypo-osmolality and hyponatremia: Secondary | ICD-10-CM | POA: Diagnosis not present

## 2019-09-01 DIAGNOSIS — R718 Other abnormality of red blood cells: Secondary | ICD-10-CM | POA: Diagnosis not present

## 2019-10-02 DIAGNOSIS — I10 Essential (primary) hypertension: Secondary | ICD-10-CM | POA: Diagnosis not present

## 2019-10-02 DIAGNOSIS — M1991 Primary osteoarthritis, unspecified site: Secondary | ICD-10-CM | POA: Diagnosis not present

## 2019-11-01 DIAGNOSIS — I10 Essential (primary) hypertension: Secondary | ICD-10-CM | POA: Diagnosis not present

## 2019-11-01 DIAGNOSIS — M5030 Other cervical disc degeneration, unspecified cervical region: Secondary | ICD-10-CM | POA: Diagnosis not present

## 2019-11-01 DIAGNOSIS — M1991 Primary osteoarthritis, unspecified site: Secondary | ICD-10-CM | POA: Diagnosis not present

## 2019-12-02 DIAGNOSIS — M5030 Other cervical disc degeneration, unspecified cervical region: Secondary | ICD-10-CM | POA: Diagnosis not present

## 2019-12-02 DIAGNOSIS — M1991 Primary osteoarthritis, unspecified site: Secondary | ICD-10-CM | POA: Diagnosis not present

## 2019-12-02 DIAGNOSIS — G894 Chronic pain syndrome: Secondary | ICD-10-CM | POA: Diagnosis not present

## 2019-12-02 DIAGNOSIS — I10 Essential (primary) hypertension: Secondary | ICD-10-CM | POA: Diagnosis not present

## 2020-06-27 ENCOUNTER — Other Ambulatory Visit (HOSPITAL_COMMUNITY): Payer: Self-pay | Admitting: Internal Medicine

## 2020-06-27 DIAGNOSIS — R059 Cough, unspecified: Secondary | ICD-10-CM

## 2020-06-28 ENCOUNTER — Ambulatory Visit (HOSPITAL_COMMUNITY)
Admission: RE | Admit: 2020-06-28 | Discharge: 2020-06-28 | Disposition: A | Discharge status: Home / Self Care | Payer: Medicare HMO | Source: Ambulatory Visit | Attending: Internal Medicine | Admitting: Internal Medicine

## 2020-06-28 ENCOUNTER — Other Ambulatory Visit: Payer: Self-pay

## 2020-06-28 DIAGNOSIS — R059 Cough, unspecified: Secondary | ICD-10-CM

## 2020-06-28 DIAGNOSIS — R05 Cough: Secondary | ICD-10-CM | POA: Diagnosis not present

## 2020-08-24 ENCOUNTER — Encounter (INDEPENDENT_AMBULATORY_CARE_PROVIDER_SITE_OTHER): Payer: Self-pay | Admitting: *Deleted

## 2021-06-14 ENCOUNTER — Emergency Department (HOSPITAL_COMMUNITY): Payer: Medicare HMO

## 2021-06-14 ENCOUNTER — Encounter (HOSPITAL_COMMUNITY): Payer: Self-pay

## 2021-06-14 ENCOUNTER — Other Ambulatory Visit: Payer: Self-pay

## 2021-06-14 ENCOUNTER — Observation Stay (HOSPITAL_COMMUNITY)
Admission: EM | Admit: 2021-06-14 | Discharge: 2021-06-16 | Disposition: A | Discharge status: Home / Self Care | Payer: Medicare HMO | Attending: Internal Medicine | Admitting: Internal Medicine

## 2021-06-14 DIAGNOSIS — J9621 Acute and chronic respiratory failure with hypoxia: Secondary | ICD-10-CM | POA: Diagnosis not present

## 2021-06-14 DIAGNOSIS — J9601 Acute respiratory failure with hypoxia: Secondary | ICD-10-CM | POA: Diagnosis present

## 2021-06-14 DIAGNOSIS — I10 Essential (primary) hypertension: Secondary | ICD-10-CM | POA: Insufficient documentation

## 2021-06-14 DIAGNOSIS — Z7982 Long term (current) use of aspirin: Secondary | ICD-10-CM | POA: Insufficient documentation

## 2021-06-14 DIAGNOSIS — J189 Pneumonia, unspecified organism: Secondary | ICD-10-CM

## 2021-06-14 DIAGNOSIS — J441 Chronic obstructive pulmonary disease with (acute) exacerbation: Secondary | ICD-10-CM | POA: Insufficient documentation

## 2021-06-14 DIAGNOSIS — Z20822 Contact with and (suspected) exposure to covid-19: Secondary | ICD-10-CM | POA: Insufficient documentation

## 2021-06-14 DIAGNOSIS — Z79899 Other long term (current) drug therapy: Secondary | ICD-10-CM | POA: Insufficient documentation

## 2021-06-14 DIAGNOSIS — F17210 Nicotine dependence, cigarettes, uncomplicated: Secondary | ICD-10-CM | POA: Diagnosis not present

## 2021-06-14 DIAGNOSIS — J45909 Unspecified asthma, uncomplicated: Secondary | ICD-10-CM | POA: Insufficient documentation

## 2021-06-14 DIAGNOSIS — J449 Chronic obstructive pulmonary disease, unspecified: Secondary | ICD-10-CM

## 2021-06-14 DIAGNOSIS — R0602 Shortness of breath: Secondary | ICD-10-CM | POA: Diagnosis present

## 2021-06-14 DIAGNOSIS — Z96642 Presence of left artificial hip joint: Secondary | ICD-10-CM | POA: Insufficient documentation

## 2021-06-14 DIAGNOSIS — R0902 Hypoxemia: Secondary | ICD-10-CM

## 2021-06-14 LAB — URINALYSIS, ROUTINE W REFLEX MICROSCOPIC
Bacteria, UA: NONE SEEN
Bilirubin Urine: NEGATIVE
Glucose, UA: NEGATIVE mg/dL
Ketones, ur: 20 mg/dL — AB
Leukocytes,Ua: NEGATIVE
Nitrite: NEGATIVE
Protein, ur: NEGATIVE mg/dL
Specific Gravity, Urine: 1.013 (ref 1.005–1.030)
pH: 7 (ref 5.0–8.0)

## 2021-06-14 LAB — COMPREHENSIVE METABOLIC PANEL
ALT: 39 U/L (ref 0–44)
AST: 25 U/L (ref 15–41)
Albumin: 3.7 g/dL (ref 3.5–5.0)
Alkaline Phosphatase: 88 U/L (ref 38–126)
Anion gap: 12 (ref 5–15)
BUN: 10 mg/dL (ref 8–23)
CO2: 24 mmol/L (ref 22–32)
Calcium: 8.9 mg/dL (ref 8.9–10.3)
Chloride: 91 mmol/L — ABNORMAL LOW (ref 98–111)
Creatinine, Ser: 0.76 mg/dL (ref 0.61–1.24)
GFR, Estimated: >60 mL/min (ref >60)
Glucose, Bld: 100 mg/dL — ABNORMAL HIGH (ref 70–99)
Potassium: 3.7 mmol/L (ref 3.5–5.1)
Sodium: 127 mmol/L — ABNORMAL LOW (ref 135–145)
Total Bilirubin: 1.1 mg/dL (ref 0.3–1.2)
Total Protein: 7.3 g/dL (ref 6.5–8.1)

## 2021-06-14 LAB — CBC WITH DIFFERENTIAL/PLATELET
Abs Immature Granulocytes: 0.08 10*3/uL — ABNORMAL HIGH (ref 0.00–0.07)
Basophils Absolute: 0.1 10*3/uL (ref 0.0–0.1)
Basophils Relative: 1 %
Eosinophils Absolute: 0.2 10*3/uL (ref 0.0–0.5)
Eosinophils Relative: 2 %
HCT: 45.6 % (ref 39.0–52.0)
Hemoglobin: 15.9 g/dL (ref 13.0–17.0)
Immature Granulocytes: 1 %
Lymphocytes Relative: 4 %
Lymphs Abs: 0.4 10*3/uL — ABNORMAL LOW (ref 0.7–4.0)
MCH: 35.3 pg — ABNORMAL HIGH (ref 26.0–34.0)
MCHC: 34.9 g/dL (ref 30.0–36.0)
MCV: 101.1 fL — ABNORMAL HIGH (ref 80.0–100.0)
Monocytes Absolute: 1.1 10*3/uL — ABNORMAL HIGH (ref 0.1–1.0)
Monocytes Relative: 10 %
Neutro Abs: 9 10*3/uL — ABNORMAL HIGH (ref 1.7–7.7)
Neutrophils Relative %: 82 %
Platelets: 346 10*3/uL (ref 150–400)
RBC: 4.51 MIL/uL (ref 4.22–5.81)
RDW: 12.6 % (ref 11.5–15.5)
WBC: 10.9 10*3/uL — ABNORMAL HIGH (ref 4.0–10.5)
nRBC: 0 % (ref 0.0–0.2)

## 2021-06-14 LAB — LACTIC ACID, PLASMA: Lactic Acid, Venous: 2.6 mmol/L (ref 0.5–1.9)

## 2021-06-14 LAB — RESP PANEL BY RT-PCR (FLU A&B, COVID) ARPGX2
Influenza A by PCR: NEGATIVE
Influenza B by PCR: NEGATIVE
SARS Coronavirus 2 by RT PCR: NEGATIVE

## 2021-06-14 LAB — PROCALCITONIN: Procalcitonin: <0.1 ng/mL

## 2021-06-14 MED ORDER — FOLIC ACID 1 MG PO TABS
1.0000 mg | ORAL_TABLET | Freq: Every day | ORAL | Status: DC
  Administered 2021-06-14 – 2021-06-16 (×3): 1 mg via ORAL
  Filled 2021-06-14 (×3): qty 1

## 2021-06-14 MED ORDER — SODIUM CHLORIDE 0.9% FLUSH: 3.0000 mL | INTRAVENOUS | Status: DC | PRN

## 2021-06-14 MED ORDER — THIAMINE HCL 100 MG PO TABS
100.0000 mg | ORAL_TABLET | Freq: Every day | ORAL | Status: DC
  Administered 2021-06-14 – 2021-06-15 (×2): 100 mg via ORAL
  Filled 2021-06-14 (×2): qty 1

## 2021-06-14 MED ORDER — SODIUM CHLORIDE 0.9 % IV SOLN
500.0000 mg | INTRAVENOUS | Status: DC
  Administered 2021-06-15 – 2021-06-16 (×2): 500 mg via INTRAVENOUS
  Filled 2021-06-14 (×2): qty 500

## 2021-06-14 MED ORDER — ASPIRIN EC 81 MG PO TBEC
81.0000 mg | DELAYED_RELEASE_TABLET | Freq: Every day | ORAL | Status: DC
  Administered 2021-06-14 – 2021-06-16 (×3): 81 mg via ORAL
  Filled 2021-06-14 (×3): qty 1

## 2021-06-14 MED ORDER — IBUPROFEN 600 MG PO TABS
600.0000 mg | ORAL_TABLET | Freq: Four times a day (QID) | ORAL | Status: DC | PRN
  Administered 2021-06-14: 600 mg via ORAL
  Filled 2021-06-14: qty 1

## 2021-06-14 MED ORDER — LORAZEPAM 1 MG PO TABS
1.0000 mg | ORAL_TABLET | ORAL | Status: DC | PRN
  Administered 2021-06-15: 1 mg via ORAL
  Filled 2021-06-14: qty 1

## 2021-06-14 MED ORDER — IPRATROPIUM-ALBUTEROL 0.5-2.5 (3) MG/3ML IN SOLN
3.0000 mL | Freq: Four times a day (QID) | RESPIRATORY_TRACT | Status: DC
  Administered 2021-06-14 – 2021-06-16 (×8): 3 mL via RESPIRATORY_TRACT
  Filled 2021-06-14 (×8): qty 3

## 2021-06-14 MED ORDER — OMEPRAZOLE MAGNESIUM 20 MG PO TBEC: 20.0000 mg | DELAYED_RELEASE_TABLET | Freq: Every day | ORAL | Status: DC

## 2021-06-14 MED ORDER — DIPHENOXYLATE-ATROPINE 2.5-0.025 MG PO TABS: 1.0000 | ORAL_TABLET | Freq: Every day | ORAL | Status: DC | PRN

## 2021-06-14 MED ORDER — THIAMINE HCL 100 MG/ML IJ SOLN
100.0000 mg | Freq: Every day | INTRAMUSCULAR | Status: DC
  Administered 2021-06-16: 100 mg via INTRAVENOUS
  Filled 2021-06-14 (×2): qty 2

## 2021-06-14 MED ORDER — PREDNISONE 20 MG PO TABS
40.0000 mg | ORAL_TABLET | Freq: Every day | ORAL | Status: DC
  Administered 2021-06-14 – 2021-06-15 (×2): 40 mg via ORAL
  Filled 2021-06-14 (×3): qty 2

## 2021-06-14 MED ORDER — LISINOPRIL 10 MG PO TABS
40.0000 mg | ORAL_TABLET | Freq: Every day | ORAL | Status: DC
  Administered 2021-06-14 – 2021-06-16 (×3): 40 mg via ORAL
  Filled 2021-06-14 (×3): qty 4

## 2021-06-14 MED ORDER — BUDESONIDE 0.25 MG/2ML IN SUSP
0.2500 mg | Freq: Two times a day (BID) | RESPIRATORY_TRACT | Status: DC
  Administered 2021-06-14 – 2021-06-16 (×4): 0.25 mg via RESPIRATORY_TRACT
  Filled 2021-06-14 (×4): qty 2

## 2021-06-14 MED ORDER — SODIUM CHLORIDE 0.9% FLUSH
3.0000 mL | Freq: Two times a day (BID) | INTRAVENOUS | Status: DC
  Administered 2021-06-14 – 2021-06-16 (×4): 3 mL via INTRAVENOUS

## 2021-06-14 MED ORDER — ONDANSETRON HCL 4 MG/2ML IJ SOLN: 4.0000 mg | Freq: Four times a day (QID) | INTRAMUSCULAR | Status: DC | PRN

## 2021-06-14 MED ORDER — ACETAMINOPHEN 325 MG PO TABS
650.0000 mg | ORAL_TABLET | Freq: Four times a day (QID) | ORAL | Status: DC | PRN
  Administered 2021-06-14: 650 mg via ORAL

## 2021-06-14 MED ORDER — SODIUM CHLORIDE 0.9 % IV SOLN
250.0000 mL | INTRAVENOUS | Status: DC | PRN
  Administered 2021-06-16: 250 mL via INTRAVENOUS

## 2021-06-14 MED ORDER — DM-GUAIFENESIN ER 30-600 MG PO TB12
1.0000 | ORAL_TABLET | Freq: Two times a day (BID) | ORAL | Status: DC
  Administered 2021-06-14 – 2021-06-16 (×5): 1 via ORAL
  Filled 2021-06-14 (×5): qty 1

## 2021-06-14 MED ORDER — SODIUM CHLORIDE 0.9 % IV SOLN
500.0000 mg | Freq: Once | INTRAVENOUS | Status: AC
  Administered 2021-06-14: 500 mg via INTRAVENOUS
  Filled 2021-06-14: qty 500

## 2021-06-14 MED ORDER — PANTOPRAZOLE SODIUM 40 MG PO TBEC
40.0000 mg | DELAYED_RELEASE_TABLET | Freq: Every day | ORAL | Status: DC
  Administered 2021-06-14 – 2021-06-16 (×3): 40 mg via ORAL
  Filled 2021-06-14 (×3): qty 1

## 2021-06-14 MED ORDER — DILTIAZEM HCL ER COATED BEADS 240 MG PO CP24
240.0000 mg | ORAL_CAPSULE | Freq: Every day | ORAL | Status: DC
  Administered 2021-06-14 – 2021-06-16 (×3): 240 mg via ORAL
  Filled 2021-06-14 (×3): qty 1

## 2021-06-14 MED ORDER — CEFTRIAXONE SODIUM 1 G IJ SOLR
1.0000 g | Freq: Once | INTRAMUSCULAR | Status: AC
  Administered 2021-06-14: 1 g via INTRAVENOUS
  Filled 2021-06-14: qty 10

## 2021-06-14 MED ORDER — SODIUM CHLORIDE 0.9 % IV SOLN
1.0000 g | INTRAVENOUS | Status: DC
  Administered 2021-06-15 – 2021-06-16 (×2): 1 g via INTRAVENOUS
  Filled 2021-06-14 (×2): qty 10

## 2021-06-14 MED ORDER — ONDANSETRON HCL 4 MG PO TABS: 4.0000 mg | ORAL_TABLET | Freq: Four times a day (QID) | ORAL | Status: DC | PRN

## 2021-06-14 MED ORDER — LORAZEPAM 2 MG/ML IJ SOLN
1.0000 mg | INTRAMUSCULAR | Status: DC | PRN
  Administered 2021-06-14: 1 mg via INTRAVENOUS
  Filled 2021-06-14: qty 1

## 2021-06-14 MED ORDER — AMLODIPINE BESYLATE 5 MG PO TABS
10.0000 mg | ORAL_TABLET | Freq: Every day | ORAL | Status: DC
  Administered 2021-06-14 – 2021-06-16 (×3): 10 mg via ORAL
  Filled 2021-06-14 (×3): qty 2

## 2021-06-14 MED ORDER — ENOXAPARIN SODIUM 40 MG/0.4ML IJ SOSY
40.0000 mg | PREFILLED_SYRINGE | INTRAMUSCULAR | Status: DC
  Administered 2021-06-14 – 2021-06-15 (×2): 40 mg via SUBCUTANEOUS
  Filled 2021-06-14 (×2): qty 0.4

## 2021-06-14 MED ORDER — ADULT MULTIVITAMIN W/MINERALS CH
1.0000 | ORAL_TABLET | Freq: Every day | ORAL | Status: DC
  Administered 2021-06-14 – 2021-06-16 (×3): 1 via ORAL
  Filled 2021-06-14 (×3): qty 1

## 2021-06-14 NOTE — ED Notes (Signed)
ED TO INPATIENT HANDOFF REPORT  ED Nurse Name and Phone #:  [phone]  S Name/Age/Gender Jeremy [name] 70 y.o. male Room/Bed: APA11/APA11  Code Status   Code Status: Prior  Home/SNF/Other Home Patient oriented to: self, place, time and situation Is this baseline? Yes   Triage Complete: Triage complete  Chief Complaint Acute hypoxemic respiratory failure ([place]) [J96.01]  Triage Note Pt presents to ED with complaints of increased SOB, fever up to 100.6, and generalized weakness started last night.     Allergies Allergies  Allergen Reactions  . Penicillins Other (See Comments)    Has patient had a PCN reaction causing immediate rash, facial/tongue/throat swelling, SOB or lightheadedness with hypotension:##Yes# but pt states he is ok with amoxicillin. Has patient had a PCN reaction causing severe rash involving mucus membranes or skin necrosis: No Has patient had a PCN reaction that required hospitalization: No Has patient had a PCN reaction occurring within the last 10 years: No If all of the above answers are "NO", then may proceed with Cephalosporin use.      Level of Care/Admitting Diagnosis ED Disposition    ED Disposition  Admit   Condition  --   [place]: [facility] [[id number]]  Level of Care: Telemetry [5]  Covid Evaluation: Confirmed COVID Negative  Diagnosis: Acute hypoxemic respiratory failure [facility]) [[id number]]  Admitting Physician: Rodena Goldmann [[id number]]  Attending Physician: Heath Lark D [id number]         B Medical/Surgery History Past Medical History:  Diagnosis Date  . Alcohol use    daily  . Anxiety    hx (05/05/2018)  . Arthritis    "all over" (05/05/2018)  . Asthma   . Chronic bronchitis ([place])   . Dyspnea   . Family history of adverse reaction to anesthesia    "mom would have nausea after OR"   . GERD (gastroesophageal reflux disease)   . History of hiatal hernia   . Hypertension   . Pneumonia X 1  . PONV  (postoperative nausea and vomiting)   . Seasonal allergies    "spring, summer, fall"   Past Surgical History:  Procedure Laterality Date  . ANTERIOR CERVICAL DECOMP/DISCECTOMY FUSION Left   . BACK SURGERY    . COLONOSCOPY    . JOINT REPLACEMENT    . LAPAROSCOPIC CHOLECYSTECTOMY    . LUMBAR DISC SURGERY  X 2   L4-5  . NASAL POLYP EXCISION  ~ 1970  . TONSILLECTOMY    . TOTAL HIP ARTHROPLASTY Left 05/05/2018  . TOTAL HIP ARTHROPLASTY Left 05/05/2018   Procedure: LEFT TOTAL HIP ARTHROPLASTY ANTERIOR APPROACH;  Surgeon: [name], MD;  Location: [facility];  Service: Orthopedics;  Laterality: Left;     A IV Location/Drains/Wounds Patient Lines/Drains/Airways Status    Active Line/Drains/Airways    Name Placement date Placement time Site Days   Peripheral IV 06/14/21 20 G Right Antecubital 06/14/21  1005  Antecubital  less than 1   Incision (Closed) 05/05/18 Hip Left 05/05/18  1210  -- 1136          Intake/Output Last 24 hours  Intake/Output Summary (Last 24 hours) at 06/14/2021 1331 Last data filed at 06/14/2021 1146 Gross per 24 hour  Intake 100 ml  Output --  Net 100 ml    Labs/Imaging Results for orders placed or performed during the hospital encounter of 06/14/21 (from the past 48 hour(s))  Resp Panel by RT-PCR (Flu A&B, Covid) Nasopharyngeal Swab  Status: None   Collection Time: 06/14/21 10:03 AM   Specimen: Nasopharyngeal Swab; Nasopharyngeal(NP) swabs in vial transport medium  Result Value Ref Range   SARS Coronavirus 2 by RT PCR NEGATIVE NEGATIVE    Comment: (NOTE) SARS-CoV-2 target nucleic acids are NOT DETECTED.  The SARS-CoV-2 RNA is generally detectable in upper respiratory specimens during the acute phase of infection. The lowest concentration of SARS-CoV-2 viral copies this assay can detect is 138 copies/mL. A negative result does not preclude SARS-Cov-2 infection and should not be used as the sole basis for treatment or other patient management  decisions. A negative result may occur with  improper specimen collection/handling, submission of specimen other than nasopharyngeal swab, presence of viral mutation(s) within the areas targeted by this assay, and inadequate number of viral copies(<138 copies/mL). A negative result must be combined with clinical observations, patient history, and epidemiological information. The expected result is Negative.  Fact Sheet for Patients:  EntrepreneurPulse.com.au  Fact Sheet for Healthcare Providers:  IncredibleEmployment.be  This test is no t yet approved or cleared by the [place] FDA and  has been authorized for detection and/or diagnosis of SARS-CoV-2 by FDA under an Emergency Use Authorization (EUA). This EUA will remain  in effect (meaning this test can be used) for the duration of the COVID-19 declaration under Section 564(b)(1) of the Act, 21 U.S.C.section 360bbb-3(b)(1), unless the authorization is terminated  or revoked sooner.       Influenza A by PCR NEGATIVE NEGATIVE   Influenza B by PCR NEGATIVE NEGATIVE    Comment: (NOTE) The Xpert Xpress SARS-CoV-2/FLU/RSV plus assay is intended as an aid in the diagnosis of influenza from Nasopharyngeal swab specimens and should not be used as a sole basis for treatment. Nasal washings and aspirates are unacceptable for Xpert Xpress SARS-CoV-2/FLU/RSV testing.  Fact Sheet for Patients: EntrepreneurPulse.com.au  Fact Sheet for Healthcare Providers: IncredibleEmployment.be  This test is not yet approved or cleared by the [place] FDA and has been authorized for detection and/or diagnosis of SARS-CoV-2 by FDA under an Emergency Use Authorization (EUA). This EUA will remain in effect (meaning this test can be used) for the duration of the COVID-19 declaration under Section 564(b)(1) of the Act, 21 U.S.C. section 360bbb-3(b)(1), unless the authorization  is terminated or revoked.  Performed at [facility], [address] [place], [place] [address]   Comprehensive metabolic panel     Status: Abnormal   Collection Time: 06/14/21 10:04 AM  Result Value Ref Range   Sodium 127 (L) 135 - 145 mmol/L   Potassium 3.7 3.5 - 5.1 mmol/L   Chloride 91 (L) 98 - 111 mmol/L   CO2 24 22 - 32 mmol/L   Glucose, Bld 100 (H) 70 - 99 mg/dL    Comment: Glucose reference range applies only to samples taken after fasting for at least 8 hours.   BUN 10 8 - 23 mg/dL   Creatinine, Ser 0.76 0.61 - 1.24 mg/dL   Calcium 8.9 8.9 - 10.3 mg/dL   Total Protein 7.3 6.5 - 8.1 g/dL   Albumin 3.7 3.5 - 5.0 g/dL   AST 25 15 - 41 U/L   ALT 39 0 - 44 U/L   Alkaline Phosphatase 88 38 - 126 U/L   Total Bilirubin 1.1 0.3 - 1.2 mg/dL   GFR, Estimated >60 >60 mL/min    Comment: (NOTE) Calculated using the CKD-EPI Creatinine Equation (2021)    Anion gap 12 5 - 15    Comment: Performed  at [facility], [address] [place], [place] [address]  CBC with Differential/Platelet     Status: Abnormal   Collection Time: 06/14/21 10:04 AM  Result Value Ref Range   WBC 10.9 (H) 4.0 - 10.5 K/uL   RBC 4.51 4.22 - 5.81 MIL/uL   Hemoglobin 15.9 13.0 - 17.0 g/dL   HCT 45.6 39.0 - 52.0 %   MCV 101.1 (H) 80.0 - 100.0 fL   MCH 35.3 (H) 26.0 - 34.0 pg   MCHC 34.9 30.0 - 36.0 g/dL   RDW 12.6 11.5 - 15.5 %   Platelets 346 150 - 400 K/uL   nRBC 0.0 0.0 - 0.2 %   Neutrophils Relative % 82 %   Neutro Abs 9.0 (H) 1.7 - 7.7 K/uL   Lymphocytes Relative 4 %   Lymphs Abs 0.4 (L) 0.7 - 4.0 K/uL   Monocytes Relative 10 %   Monocytes Absolute 1.1 (H) 0.1 - 1.0 K/uL   Eosinophils Relative 2 %   Eosinophils Absolute 0.2 0.0 - 0.5 K/uL   Basophils Relative 1 %   Basophils Absolute 0.1 0.0 - 0.1 K/uL   Immature Granulocytes 1 %   Abs Immature Granulocytes 0.08 (H) 0.00 - 0.07 K/uL    Comment: Performed at [facility], [address] [place], [place] [address]  Lactic acid, plasma     Status:  Abnormal   Collection Time: 06/14/21 10:04 AM  Result Value Ref Range   Lactic Acid, Venous 2.6 (HH) 0.5 - 1.9 mmol/L    Comment: CRITICAL RESULT CALLED TO, READ BACK BY AND VERIFIED WITH: Cugino,M@1040  by [email or web address],b 7.14.22 Performed at [facility], [address], [place], [place] [address]   Culture, blood (Routine X 2) w Reflex to ID Panel     Status: None (Preliminary result)   Collection Time: 06/14/21 10:04 AM   Specimen: Right Antecubital; Blood  Result Value Ref Range   Specimen Description      RIGHT ANTECUBITAL BOTTLES DRAWN AEROBIC AND ANAEROBIC   Special Requests      Blood Culture adequate volume Performed at [facility], [address], [place], [place] [address]    Culture PENDING    Report Status PENDING   Culture, blood (Routine X 2) w Reflex to ID Panel     Status: None (Preliminary result)   Collection Time: 06/14/21 10:22 AM   Specimen: BLOOD LEFT ARM  Result Value Ref Range   Specimen Description BLOOD LEFT ARM BOTTLES DRAWN AEROBIC AND ANAEROBIC    Special Requests      Blood Culture adequate volume Performed at [facility], [address], [place],  [address]    Culture PENDING    Report Status PENDING   Urinalysis, Routine w reflex microscopic Urine, Clean Catch     Status: Abnormal   Collection Time: 06/14/21 11:56 AM  Result Value Ref Range   Color, Urine YELLOW YELLOW   APPearance CLEAR CLEAR   Specific Gravity, Urine 1.013 1.005 - 1.030   pH 7.0 5.0 - 8.0   Glucose, UA NEGATIVE NEGATIVE mg/dL   Hgb urine dipstick SMALL (A) NEGATIVE   Bilirubin Urine NEGATIVE NEGATIVE   Ketones, ur 20 (A) NEGATIVE mg/dL   Protein, ur NEGATIVE NEGATIVE mg/dL   Nitrite NEGATIVE NEGATIVE   Leukocytes,Ua NEGATIVE NEGATIVE   RBC / HPF 0-5 0 - 5 RBC/hpf   WBC, UA 0-5 0 - 5 WBC/hpf   Bacteria, UA NONE SEEN NONE SEEN    Comment: Performed at [facility]  [address] [place],  [address]   DG Chest Port 1 View  Result Date: 06/14/2021 CLINICAL  DATA:  Shortness of breath, fever EXAM: PORTABLE CHEST 1 VIEW COMPARISON:  06/28/2020 FINDINGS: There is hyperinflation of the lungs compatible with COPD. Interstitial and airspace opacities throughout the right lung, most pronounced peripherally in the right upper lobe and at right lung base. No confluent opacity on the left. Heart is normal size. No effusions. IMPRESSION: Interstitial and patchy airspace opacities throughout the right lung could reflect pneumonia. COPD. Electronically Signed   By: Rolm Baptise M.D.   On: 06/14/2021 10:21    Pending Labs Unresulted Labs (From admission, onward)   None      Vitals/Pain Today's Vitals   06/14/21 1130 06/14/21 1200 06/14/21 1230 06/14/21 1300  BP: (!) 103/57 140/71 133/64 129/83  Pulse: 83 76 78 77  Resp: (!) 22 18 20 19   Temp:      TempSrc:      SpO2: 100% 95% 97% 96%  Weight:      Height:      PainSc:        Isolation Precautions Airborne and Contact precautions  Medications Medications  cefTRIAXone (ROCEPHIN) 1 g in sodium chloride 0.9 % 100 mL IVPB (0 g Intravenous Stopped 06/14/21 1146)  azithromycin (ZITHROMAX) 500 mg in sodium chloride 0.9 % 250 mL IVPB (500 mg Intravenous New Bag/Given 06/14/21 1220)    Mobility walks Low fall risk   Focused Assessments    R Recommendations: See Admitting Provider Note  Report given to:   Additional Notes:

## 2021-06-14 NOTE — Progress Notes (Signed)
Patient received Tylenol at ED for fever re-checked temp when patient came to floor  temp 101.5. Made MD aware. New orders placed for Ibuprofen. Rechecked later during shift temp 100.1.

## 2021-06-14 NOTE — Progress Notes (Signed)
   06/14/21 1603  Assess: MEWS Score  Temp (!) 101.5 F (38.6 C)  BP 139/65  Pulse Rate 79  Resp (!) 25  SpO2 97 %  O2 Device Nasal Cannula  O2 Flow Rate (L/min) 2 L/min  Assess: MEWS Score  MEWS Temp 2  MEWS Systolic 0  MEWS Pulse 0  MEWS RR 1  MEWS LOC 0  MEWS Score 3  MEWS Score Color Yellow  Assess: if the MEWS score is Yellow or Red  Were vital signs taken at a resting state? Yes  Focused Assessment No change from prior assessment  Early Detection of Sepsis Score *See Row Information* High  MEWS guidelines implemented *See Row Information* Yes  Take Vital Signs  Increase Vital Sign Frequency  Yellow: Q 2hr X 2 then Q 4hr X 2, if remains yellow, continue Q 4hrs  Notify: Charge Nurse/RN  Name of Charge Nurse/RN Notified Mardene Celeste RN  Date Charge Nurse/RN Notified 06/14/21  Time Charge Nurse/RN Notified 31  Notify: Provider  Provider Name/Title MD Shah/Luong, Medical student  Date Provider Notified 06/14/21  Time Provider Notified 1630  Notification Type  (Secure chat)  Notification Reason Change in status

## 2021-06-14 NOTE — H&P (Signed)
History and Physical    Jeremy [name] OVF:[id number] DOB: 22-Dec-1950 DOA: 06/14/2021  Referring MD/NP/PA: Rogene Houston PCP: Redmond School, MD  Patient coming from: home  Chief Complaint: shortness of breath  HPI: Jeremy [name] is a 70 y.o. male with medical history significant of COPD, essential hypertension, tobacco use disorder, alcohol use disorder, GERD, history of lumbar surgery, history of cervical spinal surgery, primary osteoarthritis of hip s/p left total hip arthroplasty presents with shortness of breath of 2 day duration. States he can't walk across the room without feeling out of breath. Endorses having fevers and productive sputum. Using albuterol and ventolin inhalers at home. Does not need oxygen. Endorses diarrhea since last night, but denies nausea/vomiting/weight loss.   States he has been adherent to all medications. Denies antibiotic use in the last 30 days. Patient has been fully vaccinated against COVID-19 and boosted.   Patient lives at home with wife (who was at bedside). States that he smokes for 60 years (since he was 60) and smokes 7-8 cigarettes/day. Also admits to drinking 4-5 beers a night. Has stopped drinking the last 2 days due to feeling ill.    ED Course: In the ED, patient de sat to 87% improved to 92% on [place] 2L, rectal temp 101.31F, tachypneic RR 24. Labs were significant for leukocytosis WBC 10.9, lactic acid 2.6. Electrolytes significant for hyponatremia sodium 127, chloride 91, glucose 100.    Influenza and COVID-19 rapid tests were negative. U/A was negative for infection. EKG shows HR 90s, sinus rhythm. Portable CXR showed interstitial opacities concerning for pneumonia, especially in setting of COPD exacerbation. Patient received Zithromax and Rocephin IV.   Review of Systems:   Review of Systems  Constitutional:  Positive for chills, fever and malaise/fatigue. Negative for diaphoresis and weight loss.  HENT:  Positive for hearing loss.    Eyes: Negative.   Respiratory:  Positive for cough, sputum production, shortness of breath and wheezing. Negative for hemoptysis.   Cardiovascular:  Negative for chest pain, palpitations, orthopnea and leg swelling.  Gastrointestinal:  Positive for diarrhea.  Genitourinary: Negative.   Musculoskeletal:  Positive for joint pain.  Skin: Negative.   Neurological: Negative.   Endo/Heme/Allergies: Negative.   Psychiatric/Behavioral: Negative.     Past Medical History:  Diagnosis Date   Alcohol use    daily   Anxiety    hx (05/05/2018)   Arthritis    "all over" (05/05/2018)   Asthma    Chronic bronchitis ([place])    Dyspnea    Family history of adverse reaction to anesthesia    "mom would have nausea after OR"    GERD (gastroesophageal reflux disease)    History of hiatal hernia    Hypertension    Pneumonia X 1   PONV (postoperative nausea and vomiting)    Seasonal allergies    "spring, summer, fall"    Past Surgical History:  Procedure Laterality Date   ANTERIOR CERVICAL DECOMP/DISCECTOMY FUSION Left    BACK SURGERY     COLONOSCOPY     JOINT REPLACEMENT     LAPAROSCOPIC CHOLECYSTECTOMY     LUMBAR DISC SURGERY  X 2   L4-5   NASAL POLYP EXCISION  ~ [place] Left 05/05/2018   TOTAL HIP ARTHROPLASTY Left 05/05/2018   Procedure: LEFT TOTAL HIP ARTHROPLASTY ANTERIOR APPROACH;  Surgeon: [name], MD;  Location: [facility];  Service: Orthopedics;  Laterality: Left;     reports that  he has been smoking cigarettes and pipe. He has a 55.00 pack-year smoking history. He has never used smokeless tobacco. He reports current alcohol use of about 14.0 standard drinks of alcohol per week. He reports that he does not use drugs.  Allergies  Allergen Reactions   Penicillins Other (See Comments)    Has patient had a PCN reaction causing immediate rash, facial/tongue/throat swelling, SOB or lightheadedness with hypotension:##Yes# but pt states he is ok  with amoxicillin. Has patient had a PCN reaction causing severe rash involving mucus membranes or skin necrosis: No Has patient had a PCN reaction that required hospitalization: No Has patient had a PCN reaction occurring within the last 10 years: No If all of the above answers are "NO", then may proceed with Cephalosporin use.      No family history on file.   Prior to Admission medications   Medication Sig Start Date End Date Taking? Authorizing Provider  amLODipine (NORVASC) 10 MG tablet Take 10 mg by mouth daily. 07/28/17  Yes [provider]  aspirin EC 81 MG tablet Take 1 tablet (81 mg total) by mouth 2 (two) times daily. For DVT prophylaxis Patient taking differently: Take 81 mg by mouth daily. For DVT prophylaxis 05/05/18  Yes Prudencio Burly III, PA-C  diltiazem (CARDIZEM CD) 240 MG 24 hr capsule Take 240 mg by mouth daily.  05/27/17  Yes [provider]  diphenoxylate-atropine (LOMOTIL) 2.5-0.025 MG tablet Take 1 tablet by mouth daily.   Yes [provider]  Glucosamine HCl (GLUCOSAMINE PO) Take by mouth in the morning and at bedtime.   Yes [provider]  lisinopril (PRINIVIL,ZESTRIL) 40 MG tablet Take 40 mg by mouth daily. 05/07/17  Yes [provider]  omeprazole (PRILOSEC OTC) 20 MG tablet Take 20 mg by mouth daily.    Yes [provider]  Donnal Debar 200-62.5-25 MCG/INH AEPB Take 1 puff by mouth daily. 05/19/21  Yes [provider]  TURMERIC PO Take by mouth daily.   Yes [provider]  VENTOLIN HFA 108 (90 Base) MCG/ACT inhaler Inhale 1-2 puffs into the lungs every 6 (six) hours as needed for wheezing or shortness of breath.  07/04/17  Yes [provider]  baclofen (LIORESAL) 10 MG tablet Take 1 tablet (10 mg total) by mouth 3 (three) times daily as needed for muscle spasms. Patient not taking: Reported on 06/14/2021 05/05/18   Prudencio Burly III, PA-C  cholecalciferol (VITAMIN D)  1000 units tablet Take 1,000 Units by mouth daily. Patient not taking: Reported on 06/14/2021    [provider]  docusate sodium (COLACE) 100 MG capsule Take 1 capsule (100 mg total) by mouth 2 (two) times daily. To prevent constipation while taking pain medication. Patient not taking: Reported on 06/14/2021 05/05/18   Prudencio Burly III, PA-C  HYDROcodone-acetaminophen (NORCO) 5-325 MG tablet Take 1-2 tablets by mouth every 6 (six) hours as needed for moderate pain. Patient not taking: Reported on 06/14/2021 05/05/18   Prudencio Burly III, PA-C  Lidocaine HCl 4 % LIQD Apply 1 application topically daily. HIP PAIN Patient not taking: Reported on 06/14/2021    [provider]  meloxicam (MOBIC) 7.5 MG tablet Take 7.5 mg by mouth daily. Patient not taking: Reported on 06/14/2021 07/08/17   [provider]  ondansetron (ZOFRAN) 4 MG tablet Take 1 tablet (4 mg total) by mouth every 8 (eight) hours as needed for nausea or vomiting. Patient not taking: Reported on 06/14/2021 05/05/18  Prudencio Burly III, [place]    Physical Exam: Vitals:   06/14/21 1300 06/14/21 1330 06/14/21 1400 06/14/21 1406  BP: 129/83 (!) 127/92  132/89  Pulse: 77 78  76  Resp: 19 (!) 24    Temp:   (!) 101.8 F (38.8 C)   TempSrc:   Rectal   SpO2: 96% 92%    Weight:      Height:          Constitutional: NAD, calm, comfortable Vitals:   06/14/21 1300 06/14/21 1330 06/14/21 1400 06/14/21 1406  BP: 129/83 (!) 127/92  132/89  Pulse: 77 78  76  Resp: 19 (!) 24    Temp:   (!) 101.8 F (38.8 C)   TempSrc:   Rectal   SpO2: 96% 92%    Weight:      Height:       Eyes: PERRL, lids and conjunctivae normal ENMT: Mucous membranes are moist. Wearing .  Neck: normal, supple, no masses, no thyromegaly Respiratory: Decreased breath sounds through out, expiratory wheezes at lung bases; pursed lip breathing appreciated. No accessory muscles used Cardiovascular: Regular rate and  rhythm, no murmurs / rubs / gallops. No extremity edema.  Abdomen: no tenderness, no masses palpated. No hepatosplenomegaly. Bowel sounds positive.  Musculoskeletal: no clubbing / cyanosis. No joint deformity upper and lower extremities. Good ROM, no contractures. Normal muscle tone.  Skin: no rashes, lesions, ulcers. No induration Neurologic: CN 2-12 grossly intact. Sensation intact, DTR normal. Strength 5/5 in all 4. Hard of hearing.  Psychiatric: Normal judgment and insight. Alert and awake. Normal mood.   Labs on Admission: I have personally reviewed following labs and imaging studies  CBC: Recent Labs  Lab 06/14/21 1004  WBC 10.9*  NEUTROABS 9.0*  HGB 15.9  HCT 45.6  MCV 101.1*  PLT 518   Basic Metabolic Panel: Recent Labs  Lab 06/14/21 1004  NA 127*  K 3.7  CL 91*  CO2 24  GLUCOSE 100*  BUN 10  CREATININE 0.76  CALCIUM 8.9   GFR: Estimated Creatinine Clearance: 88.9 mL/min (by C-G formula based on SCr of 0.76 mg/dL). Liver Function Tests: Recent Labs  Lab 06/14/21 1004  AST 25  ALT 39  ALKPHOS 88  BILITOT 1.1  PROT 7.3  ALBUMIN 3.7   No results for input(s): LIPASE, AMYLASE in the last 168 hours. No results for input(s): AMMONIA in the last 168 hours. Coagulation Profile: No results for input(s): INR, PROTIME in the last 168 hours. Cardiac Enzymes: No results for input(s): CKTOTAL, CKMB, CKMBINDEX, TROPONINI in the last 168 hours. BNP (last 3 results) No results for input(s): PROBNP in the last 8760 hours. HbA1C: No results for input(s): HGBA1C in the last 72 hours. CBG: No results for input(s): GLUCAP in the last 168 hours. Lipid Profile: No results for input(s): CHOL, HDL, LDLCALC, TRIG, CHOLHDL, LDLDIRECT in the last 72 hours. Thyroid Function Tests: No results for input(s): TSH, T4TOTAL, FREET4, T3FREE, THYROIDAB in the last 72 hours. Anemia Panel: No results for input(s): VITAMINB12, FOLATE, FERRITIN, TIBC, IRON, RETICCTPCT in the last 72  hours. Urine analysis:    Component Value Date/Time   COLORURINE YELLOW 06/14/2021 [place] 06/14/2021 1156   LABSPEC 1.013 06/14/2021 1156   PHURINE 7.0 06/14/2021 1156   GLUCOSEU NEGATIVE 06/14/2021 1156   HGBUR SMALL (A) 06/14/2021 1156   BILIRUBINUR NEGATIVE 06/14/2021 1156   KETONESUR 20 (A) 06/14/2021 1156   PROTEINUR NEGATIVE 06/14/2021 1156   NITRITE NEGATIVE  06/14/2021 [place] 06/14/2021 1156   Sepsis Labs: @LABRCNTIP (procalcitonin:4,lacticidven:4) ) Recent Results (from the past 240 hour(s))  Resp Panel by RT-PCR (Flu A&B, Covid) Nasopharyngeal Swab     Status: None   Collection Time: 06/14/21 10:03 AM   Specimen: Nasopharyngeal Swab; Nasopharyngeal(NP) swabs in vial transport medium  Result Value Ref Range Status   SARS Coronavirus 2 by RT PCR NEGATIVE NEGATIVE Final    Comment: (NOTE) SARS-CoV-2 target nucleic acids are NOT DETECTED.  The SARS-CoV-2 RNA is generally detectable in upper respiratory specimens during the acute phase of infection. The lowest concentration of SARS-CoV-2 viral copies this assay can detect is 138 copies/mL. A negative result does not preclude SARS-Cov-2 infection and should not be used as the sole basis for treatment or other patient management decisions. A negative result may occur with  improper specimen collection/handling, submission of specimen other than nasopharyngeal swab, presence of viral mutation(s) within the areas targeted by this assay, and inadequate number of viral copies(<138 copies/mL). A negative result must be combined with clinical observations, patient history, and epidemiological information. The expected result is Negative.  Fact Sheet for Patients:  EntrepreneurPulse.com.au  Fact Sheet for Healthcare Providers:  IncredibleEmployment.be  This test is no t yet approved or cleared by the [place] FDA and  has been authorized for  detection and/or diagnosis of SARS-CoV-2 by FDA under an Emergency Use Authorization (EUA). This EUA will remain  in effect (meaning this test can be used) for the duration of the COVID-19 declaration under Section 564(b)(1) of the Act, 21 U.S.C.section 360bbb-3(b)(1), unless the authorization is terminated  or revoked sooner.       Influenza A by PCR NEGATIVE NEGATIVE Final   Influenza B by PCR NEGATIVE NEGATIVE Final    Comment: (NOTE) The Xpert Xpress SARS-CoV-2/FLU/RSV plus assay is intended as an aid in the diagnosis of influenza from Nasopharyngeal swab specimens and should not be used as a sole basis for treatment. Nasal washings and aspirates are unacceptable for Xpert Xpress SARS-CoV-2/FLU/RSV testing.  Fact Sheet for Patients: EntrepreneurPulse.com.au  Fact Sheet for Healthcare Providers: IncredibleEmployment.be  This test is not yet approved or cleared by the [place] FDA and has been authorized for detection and/or diagnosis of SARS-CoV-2 by FDA under an Emergency Use Authorization (EUA). This EUA will remain in effect (meaning this test can be used) for the duration of the COVID-19 declaration under Section 564(b)(1) of the Act, 21 U.S.C. section 360bbb-3(b)(1), unless the authorization is terminated or revoked.  Performed at [facility], [address], [place], [place] [address]   Culture, blood (Routine X 2) w Reflex to ID Panel     Status: None (Preliminary result)   Collection Time: 06/14/21 10:04 AM   Specimen: Right Antecubital; Blood  Result Value Ref Range Status   Specimen Description   Final    RIGHT ANTECUBITAL BOTTLES DRAWN AEROBIC AND ANAEROBIC   Special Requests   Final    Blood Culture adequate volume Performed at [facility], [address], [place], [place] [address]    Culture PENDING  Incomplete   Report Status PENDING  Incomplete  Culture, blood (Routine X 2) w Reflex to ID Panel     Status: None  (Preliminary result)   Collection Time: 06/14/21 10:22 AM   Specimen: BLOOD LEFT ARM  Result Value Ref Range Status   Specimen Description BLOOD LEFT ARM BOTTLES DRAWN AEROBIC AND ANAEROBIC  Final   Special Requests   Final  Blood Culture adequate volume Performed at [facility], [address] [place], [place] [address]    Culture PENDING  Incomplete   Report Status PENDING  Incomplete     Radiological Exams on Admission: DG Chest Port 1 View  Result Date: 06/14/2021 CLINICAL DATA:  Shortness of breath, fever EXAM: PORTABLE CHEST 1 VIEW COMPARISON:  06/28/2020 FINDINGS: There is hyperinflation of the lungs compatible with COPD. Interstitial and airspace opacities throughout the right lung, most pronounced peripherally in the right upper lobe and at right lung base. No confluent opacity on the left. Heart is normal size. No effusions. IMPRESSION: Interstitial and patchy airspace opacities throughout the right lung could reflect pneumonia. COPD. Electronically Signed   By: Rolm Baptise M.D.   On: 06/14/2021 10:21    EKG: Independently reviewed. Sinus rhythm, rate 80s, no ST changes  Assessment/Plan Principal Problem:   Acute hypoxemic respiratory failure (HCC) Active Problems:   Community acquired pneumonia  Acute hypoxemic respiratory failure - patient with history of COPD on albuterol and ventolin, no O2 at home presents with shortness of breath found to have hypoxia (O2 sat 87% on room air), fever 101.8, tachypnea, leukocytosis, elevated lactate and hyponatremia, interstitial opacities on CXR concerning for COPD exacerbation and pneumonia - O2 sat improved to 92% on 2L [place]. Titrate as needed - Breathing treatments - bulmicort, duoneb - Prednisone PO 40mg  x1 dose  Community-acquired pneumonia in setting of COPD exacerbation as described above. Moderate severity requiring hospitalization. Hyponatremia raises concerns for Legionnella spp. Infection. Negative COVID and influenza.   -  Zithromax and Rocephin daily - F/u Blood cultures - F/u sputum gram stain and culture - F/u Legionella spp test   Diarrhea - in setting of hyponatremia, pneumonia/ acute infection, considering etiologies to include legionella vs enteritis - monitor - replete electrolytes as needed  History of alcohol use  - 4/5 beers/ night, last drink 2 days ago - CIWA protocol - thiamine - ativan as needed  History of tobacco use  - smoking since 70 years old, 60-pack-year history  - monitor/ offer nicotine patch  Essential hypertension - BP 132/89 at presentation  - Continue home meds including norvasc 10mg , Diltiazem 240mg  and lisinopril, aspiring 81mg   GERD - protonix 40mg  IV   Osteoarthritis  - s/p left hip arthroplasty - continue home baclofen   DVT prophylaxis: Lovenox  Code Status: Full code (confirmed with patient) Family Communication: Wife at bedside Gael Londo [phone]) Disposition Plan: home Consults called: none Admission status: obs/tele  Severity of Illness: The appropriate patient status for this patient is OBSERVATION. Observation status is judged to be reasonable and necessary in order to provide the required intensity of service to ensure the patient's safety. The patient's presenting symptoms, physical exam findings, and initial radiographic and laboratory data in the context of their medical condition is felt to place them at decreased risk for further clinical deterioration. Furthermore, it is anticipated that the patient will be medically stable for discharge from the hospital within 2 midnights of admission. The following factors support the patient status of observation.   " The patient's presenting symptoms include shortness of breath " The physical exam findings include wheezing, O2 sat 87% at room air, needing O2,  " The initial radiographic and laboratory data are interstitial opacities bilaterally on CXR concerning for pneumonia, leukocytosis,  hyponatremia  Addendum: Patient being admitted with acute hypoxemic respiratory failure in the setting of mild COPD exacerbation with associated community-acquired pneumonia.  He is also noted  to have some mild hyponatremia which may be related to his pneumonia versus EtOH use.  He states he has not had alcohol in the last couple days and appears to be at risk for withdrawal symptoms for which CIWA protocol will be initiated.  Continue Rocephin and azithromycin as ordered.   [place] [employer] [facility] Pager [phone]  If 7PM-7AM, please contact night-coverage [email or web address] Password [facility]  06/14/2021, 2:35 PM

## 2021-06-14 NOTE — ED Triage Notes (Signed)
Pt presents to ED with complaints of increased SOB, fever up to 100.6, and generalized weakness started last night.

## 2021-06-14 NOTE — ED Provider Notes (Signed)
[facility] EMERGENCY DEPARTMENT Provider Note   CSN: [id number] Arrival date & time: 06/14/21  0930     History Chief Complaint  Patient presents with   Shortness of Breath    Jeremy [name] is a 70 y.o. male.  Patient coming in with a complaint of shortness of breath fever generalized weakness.  Patient states that has been feeling a little bit short of breath for couple months.  Has COPD.  States that really on Tuesday he started to feel bad had fever yesterday.  Fever up to 100.6.  Temp on arrival here is 100.  Respiratory rate 28.  On room air he was 87%.  Patient is not normally on oxygen.  Patient started on 2 L of oxygen which brought his oxygen levels up above 95%.  Patient has been vaccinated and boosted.  Patient also had diarrhea on Tuesday.  Past medical history is significant for alcohol use daily.  History of chronic bronchitis.  Gastroesophageal reflux disease.  Hypertension.      Past Medical History:  Diagnosis Date   Alcohol use    daily   Anxiety    hx (05/05/2018)   Arthritis    "all over" (05/05/2018)   Asthma    Chronic bronchitis ([place])    Dyspnea    Family history of adverse reaction to anesthesia    "mom would have nausea after OR"    GERD (gastroesophageal reflux disease)    History of hiatal hernia    Hypertension    Pneumonia X 1   PONV (postoperative nausea and vomiting)    Seasonal allergies    "spring, summer, fall"    Patient Active Problem List   Diagnosis Date Noted   Primary osteoarthritis of hip 05/05/2018   Alcohol use 03/30/2018   Primary osteoarthritis of left hip 03/30/2018   Essential hypertension 03/30/2018   History of lumbar surgery 03/30/2018   History of cervical spinal surgery 03/30/2018   Tobacco abuse disorder 03/30/2018    Past Surgical History:  Procedure Laterality Date   ANTERIOR CERVICAL DECOMP/DISCECTOMY FUSION Left    BACK SURGERY     COLONOSCOPY     JOINT REPLACEMENT     LAPAROSCOPIC CHOLECYSTECTOMY      LUMBAR DISC SURGERY  X 2   L4-5   NASAL POLYP EXCISION  ~ [place] ARTHROPLASTY Left 05/05/2018   TOTAL HIP ARTHROPLASTY Left 05/05/2018   Procedure: LEFT TOTAL HIP ARTHROPLASTY ANTERIOR APPROACH;  Surgeon: [name], MD;  Location: [place];  Service: Orthopedics;  Laterality: Left;       No family history on file.  Social History   Tobacco Use   Smoking status: Every Day    Packs/day: 1.00    Years: 55.00    Pack years: 55.00    Types: Cigarettes, Pipe   Smokeless tobacco: Never  Vaping Use   Vaping Use: Never used  Substance Use Topics   Alcohol use: Yes    Alcohol/week: 14.0 standard drinks    Types: 14 Shots of liquor per week   Drug use: Never    Home Medications Prior to Admission medications   Medication Sig Start Date End Date Taking? Authorizing Provider  amLODipine (NORVASC) 10 MG tablet Take 10 mg by mouth daily. 07/28/17  Yes [provider]  aspirin EC 81 MG tablet Take 1 tablet (81 mg total) by mouth 2 (two) times daily. For DVT prophylaxis Patient taking differently: Take 48  mg by mouth daily. For DVT prophylaxis 05/05/18  Yes Prudencio Burly III, PA-C  diltiazem (CARDIZEM CD) 240 MG 24 hr capsule Take 240 mg by mouth daily.  05/27/17  Yes [provider]  lisinopril (PRINIVIL,ZESTRIL) 40 MG tablet Take 40 mg by mouth daily. 05/07/17  Yes [provider]  omeprazole (PRILOSEC OTC) 20 MG tablet Take 20 mg by mouth daily.    Yes [provider]  baclofen (LIORESAL) 10 MG tablet Take 1 tablet (10 mg total) by mouth 3 (three) times daily as needed for muscle spasms. 05/05/18   Prudencio Burly III, PA-C  cholecalciferol (VITAMIN D) 1000 units tablet Take 1,000 Units by mouth daily.    [provider]  docusate sodium (COLACE) 100 MG capsule Take 1 capsule (100 mg total) by mouth 2 (two) times daily. To prevent constipation while taking pain medication. 05/05/18   Martensen, Charna Elizabeth III, PA-C  fluticasone (FLONASE) 50 MCG/ACT nasal spray Place 1 spray into both nostrils daily.    [provider]  HYDROcodone-acetaminophen (NORCO) 5-325 MG tablet Take 1-2 tablets by mouth every 6 (six) hours as needed for moderate pain. 05/05/18   Prudencio Burly III, PA-C  Lidocaine HCl (ASPERCREME LIDOCAINE) 4 % LIQD Apply 1 application topically daily. HIP PAIN    [provider]  meloxicam (MOBIC) 7.5 MG tablet Take 7.5 mg by mouth daily. 07/08/17   [provider]  ondansetron (ZOFRAN) 4 MG tablet Take 1 tablet (4 mg total) by mouth every 8 (eight) hours as needed for nausea or vomiting. 05/05/18   Martensen, Charna Elizabeth III, PA-C  VENTOLIN HFA 108 (90 Base) MCG/ACT inhaler Inhale 1-2 puffs into the lungs every 6 (six) hours as needed for wheezing or shortness of breath.  07/04/17   [provider]    Allergies    Penicillins  Review of Systems   Review of Systems  Constitutional:  Positive for fever. Negative for chills.  HENT:  Negative for congestion, ear pain and sore throat.   Eyes:  Negative for pain and visual disturbance.  Respiratory:  Positive for shortness of breath. Negative for cough.   Cardiovascular:  Negative for chest pain and palpitations.  Gastrointestinal:  Positive for diarrhea. Negative for abdominal pain and vomiting.  Genitourinary:  Negative for dysuria and hematuria.  Musculoskeletal:  Negative for arthralgias and back pain.  Skin:  Negative for color change and rash.  Neurological:  Positive for weakness. Negative for seizures and syncope.  All other systems reviewed and are negative.  Physical Exam Updated Vital Signs BP 133/64   Pulse 78   Temp 100 F (37.8 C) (Oral)   Resp 20   Ht 1.803 m (5\' 11" )   Wt 83.9 kg   SpO2 97%   BMI 25.80 kg/m   Physical Exam Vitals and nursing note reviewed.  Constitutional:      General: He is in acute distress.     Appearance: Normal appearance. He is  well-developed. He is ill-appearing.  HENT:     Head: Normocephalic and atraumatic.  Eyes:     Extraocular Movements: Extraocular movements intact.     Conjunctiva/sclera: Conjunctivae normal.     Pupils: Pupils are equal, round, and reactive to light.  Cardiovascular:     Rate and Rhythm: Normal rate and regular rhythm.     Heart sounds: No murmur heard. Pulmonary:     Effort: Respiratory distress present.     Breath sounds: Rales present. No  wheezing.  Chest:     Chest wall: No tenderness.  Abdominal:     Palpations: Abdomen is soft.     Tenderness: There is no abdominal tenderness.  Musculoskeletal:        General: No swelling. Normal range of motion.     Cervical back: Normal range of motion and neck supple.  Skin:    General: Skin is warm and dry.  Neurological:     General: No focal deficit present.     Mental Status: He is alert and oriented to person, place, and time.    ED Results / Procedures / Treatments   Labs (all labs ordered are listed, but only abnormal results are displayed) Labs Reviewed  COMPREHENSIVE METABOLIC PANEL - Abnormal; Notable for the following components:      Result Value   Sodium 127 (*)    Chloride 91 (*)    Glucose, Bld 100 (*)    All other components within normal limits  CBC WITH DIFFERENTIAL/PLATELET - Abnormal; Notable for the following components:   WBC 10.9 (*)    MCV 101.1 (*)    MCH 35.3 (*)    Neutro Abs 9.0 (*)    Lymphs Abs 0.4 (*)    Monocytes Absolute 1.1 (*)    Abs Immature Granulocytes 0.08 (*)    All other components within normal limits  URINALYSIS, ROUTINE W REFLEX MICROSCOPIC - Abnormal; Notable for the following components:   Hgb urine dipstick SMALL (*)    Ketones, ur 20 (*)    All other components within normal limits  LACTIC ACID, PLASMA - Abnormal; Notable for the following components:   Lactic Acid, Venous 2.6 (*)    All other components within normal limits  RESP PANEL BY RT-PCR (FLU A&B, COVID) [id number]   CULTURE, BLOOD (ROUTINE X 2)  CULTURE, BLOOD (ROUTINE X 2)    EKG EKG Interpretation  Date/Time:  Thursday June 14 2021 09:53:24 EDT Ventricular Rate:  89 PR Interval:  163 QRS Duration: 95 QT Interval:  357 QTC Calculation: 435 R Axis:   82 Text Interpretation: Sinus rhythm Borderline right axis deviation No significant change since last tracing Confirmed by Fredia Sorrow [phone]) on 06/14/2021 9:56:39 AM  Radiology DG Chest Port 1 View  Result Date: 06/14/2021 CLINICAL DATA:  Shortness of breath, fever EXAM: PORTABLE CHEST 1 VIEW COMPARISON:  06/28/2020 FINDINGS: There is hyperinflation of the lungs compatible with COPD. Interstitial and airspace opacities throughout the right lung, most pronounced peripherally in the right upper lobe and at right lung base. No confluent opacity on the left. Heart is normal size. No effusions. IMPRESSION: Interstitial and patchy airspace opacities throughout the right lung could reflect pneumonia. COPD. Electronically Signed   By: Rolm Baptise M.D.   On: 06/14/2021 10:21    Procedures Procedures   CRITICAL CARE Performed by: Fredia Sorrow Total critical care time: 40 minutes Critical care time was exclusive of separately billable procedures and treating other patients. Critical care was necessary to treat or prevent imminent or life-threatening deterioration. Critical care was time spent personally by me on the following activities: development of treatment plan with patient and/or surrogate as well as nursing, discussions with consultants, evaluation of patient's response to treatment, examination of patient, obtaining history from patient or surrogate, ordering and performing treatments and interventions, ordering and review of laboratory studies, ordering and review of radiographic studies, pulse oximetry and re-evaluation of patient's condition.   Medications Ordered in ED Medications  azithromycin (ZITHROMAX) 500 mg  in sodium chloride  0.9 % 250 mL IVPB (500 mg Intravenous New Bag/Given 06/14/21 1220)  cefTRIAXone (ROCEPHIN) 1 g in sodium chloride 0.9 % 100 mL IVPB (0 g Intravenous Stopped 06/14/21 1146)    ED Course  I have reviewed the triage vital signs and the nursing notes.  Pertinent labs & imaging results that were available during my care of the patient were reviewed by me and considered in my medical decision making (see chart for details).    MDM Rules/Calculators/A&P                          Patient with hypoxia.  Not normally on home oxygen.  Oxygen saturation was 87%.  Improved on 2 L of oxygen.  EKG without any acute findings.  Urinalysis negative.  Clinically suspected patient would maybe have COVID.  However COVID testing and influenza testing negative.   Chest x-ray positive for patchy infiltrate right side of the lung.  Throughout.  In the face of COVID testing this consistent with pneumonia.  Patient treated with Rocephin and Zithromax IV.  Patient's electrolytes significant for sodium 127 chloride 91 glucose 100.  The low sodium could be suggestive of may be Legionella as the cause.  The patient has received Zithromax.  White blood cell count is 10.9.  Hemoglobin is 15.9.  Discussed with hospitalist who will admit for the pneumonia with hypoxia.   Final Clinical Impression(s) / ED Diagnoses Final diagnoses:  Hypoxia  Community acquired pneumonia of right lung, unspecified part of lung    Rx / DC Orders ED Discharge Orders     None        Fredia Sorrow, MD 06/14/21 1347

## 2021-06-15 ENCOUNTER — Encounter (HOSPITAL_COMMUNITY): Payer: Self-pay | Admitting: Internal Medicine

## 2021-06-15 DIAGNOSIS — J9601 Acute respiratory failure with hypoxia: Secondary | ICD-10-CM | POA: Diagnosis not present

## 2021-06-15 LAB — LEGIONELLA PNEUMOPHILA SEROGP 1 UR AG: L. pneumophila Serogp 1 Ur Ag: NEGATIVE

## 2021-06-15 LAB — COMPREHENSIVE METABOLIC PANEL
ALT: 43 U/L (ref 0–44)
AST: 30 U/L (ref 15–41)
Albumin: 3.3 g/dL — ABNORMAL LOW (ref 3.5–5.0)
Alkaline Phosphatase: 81 U/L (ref 38–126)
Anion gap: 9 (ref 5–15)
BUN: 11 mg/dL (ref 8–23)
CO2: 26 mmol/L (ref 22–32)
Calcium: 8.9 mg/dL (ref 8.9–10.3)
Chloride: 92 mmol/L — ABNORMAL LOW (ref 98–111)
Creatinine, Ser: 0.65 mg/dL (ref 0.61–1.24)
GFR, Estimated: >60 mL/min (ref >60)
Glucose, Bld: 120 mg/dL — ABNORMAL HIGH (ref 70–99)
Potassium: 4 mmol/L (ref 3.5–5.1)
Sodium: 127 mmol/L — ABNORMAL LOW (ref 135–145)
Total Bilirubin: 0.6 mg/dL (ref 0.3–1.2)
Total Protein: 6.7 g/dL (ref 6.5–8.1)

## 2021-06-15 LAB — CBC
HCT: 44.1 % (ref 39.0–52.0)
Hemoglobin: 15.6 g/dL (ref 13.0–17.0)
MCH: 35.4 pg — ABNORMAL HIGH (ref 26.0–34.0)
MCHC: 35.4 g/dL (ref 30.0–36.0)
MCV: 100 fL (ref 80.0–100.0)
Platelets: 354 10*3/uL (ref 150–400)
RBC: 4.41 MIL/uL (ref 4.22–5.81)
RDW: 12.5 % (ref 11.5–15.5)
WBC: 8.5 10*3/uL (ref 4.0–10.5)
nRBC: 0 % (ref 0.0–0.2)

## 2021-06-15 LAB — STREP PNEUMONIAE URINARY ANTIGEN: Strep Pneumo Urinary Antigen: NEGATIVE

## 2021-06-15 LAB — MAGNESIUM: Magnesium: 2.1 mg/dL (ref 1.7–2.4)

## 2021-06-15 MED ORDER — METHYLPREDNISOLONE SODIUM SUCC 40 MG IJ SOLR
40.0000 mg | Freq: Two times a day (BID) | INTRAMUSCULAR | Status: DC
  Administered 2021-06-15 – 2021-06-16 (×2): 40 mg via INTRAVENOUS
  Filled 2021-06-15 (×2): qty 1

## 2021-06-15 NOTE — Care Management Obs Status (Signed)
[place] NOTIFICATION   Patient Details  Name: Jeremy [name] MRN: [id number] Date of Birth: 04-02-1951   Medicare Observation Status Notification Given:  Yes    Tommy Medal 06/15/2021, 4:29 PM

## 2021-06-15 NOTE — Discharge Summary (Addendum)
Physician Discharge Summary  Jeremy [name] LZJ:[id number] DOB: Oct 27, 1951 DOA: 06/14/2021  PCP: Redmond School, MD  Admit date: 06/14/2021  Discharge date: 06/16/21  Admitted From:Home  Disposition:  Home  Recommendations for Outpatient Follow-up:  Follow up with PCP Dr. Gerarda Fraction in 1-2 weeks Follow up with Dr. Melvyn Novas outpatient with referral sent Please obtain BMP/CBC in one week to follow up on hyponatremia Please obtain a Hgb A1c to evaluate for elevated fasting glucose (~120s) Please continue ventolin and albuterol for shortness of breath Please continue to take prednisone 40mg  tablet once daily for 5 more days  Please take Omnicef for total 7 day course Tobacco cessation counseling was provided at bedside.   Home Health: none   Equipment/Devices: none  Discharge Condition:Stable  CODE STATUS: Full  Diet recommendation: Heart Healthy  Brief/Interim Summary: Jeremy [name] is a 70 y.o. male with medical history significant of COPD, essential hypertension, tobacco use disorder, alcohol use disorder, GERD, history of lumbar surgery, history of cervical spinal surgery, primary osteoarthritis of hip s/p left total hip arthroplasty presents with shortness of breath with associated fever, productive sputum of 2 day duration found to have interstitial pneumonia on CXR. Given patient's older age, history of COPD, hypoxia, fever, and hyponatremia, PSI/PORT score (107) indicated moderate mortality risk patient was hospitalized for inpatient treatment of pneumonia. Patient was de satting at 87% on room air and improved to 94% on 2L [place]. Patient received prednisone 40mg , antibiotics Rocephin and azithromycin IV. Blood culture was obtained showing...   Overnight, patient improved significantly with absence of fever for >15 hours. Patient tried exertion with and without oxygen to determine oxygen demand... Tobacco cessation counseling was provided at bedside.    Discharge Diagnoses:   Principal Problem:   Acute hypoxemic respiratory failure (HCC) Active Problems:   Community acquired pneumonia  Acute on chronic respiratory failure secondary to COPD exacerbation and community acquired pneumonia   Discharge Instructions Discharge Instructions     Diet - low sodium heart healthy   Complete by: As directed    Increase activity slowly   Complete by: As directed       Allergies as of 06/16/2021       Reactions   Penicillins Other (See Comments)   Has patient had a PCN reaction causing immediate rash, facial/tongue/throat swelling, SOB or lightheadedness with hypotension:##Yes# but pt states he is ok with amoxicillin. Has patient had a PCN reaction causing severe rash involving mucus membranes or skin necrosis: No Has patient had a PCN reaction that required hospitalization: No Has patient had a PCN reaction occurring within the last 10 years: No If all of the above answers are "NO", then may proceed with Cephalosporin use.        Medication List     TAKE these medications    amLODipine 10 MG tablet Commonly known as: NORVASC Take 10 mg by mouth daily.   aspirin EC 81 MG tablet Take 1 tablet (81 mg total) by mouth 2 (two) times daily. For DVT prophylaxis What changed: when to take this   baclofen 10 MG tablet Commonly known as: LIORESAL Take 1 tablet (10 mg total) by mouth 3 (three) times daily as needed for muscle spasms.   cefdinir 300 MG capsule Commonly known as: OMNICEF Take 1 capsule (300 mg total) by mouth 2 (two) times daily for 5 days.   cholecalciferol 1000 units tablet Commonly known as: VITAMIN D Take 1,000 Units by mouth daily.   diltiazem 240 MG 24 hr  capsule Commonly known as: CARDIZEM CD Take 240 mg by mouth daily.   diphenoxylate-atropine 2.5-0.025 MG tablet Commonly known as: LOMOTIL Take 1 tablet by mouth daily.   docusate sodium 100 MG capsule Commonly known as: Colace Take 1 capsule (100 mg total) by mouth 2 (two)  times daily. To prevent constipation while taking pain medication.   GLUCOSAMINE PO Take by mouth in the morning and at bedtime.   HYDROcodone-acetaminophen 5-325 MG tablet Commonly known as: Norco Take 1-2 tablets by mouth every 6 (six) hours as needed for moderate pain.   Lidocaine HCl 4 % Liqd Apply 1 application topically daily. HIP PAIN   lisinopril 40 MG tablet Commonly known as: ZESTRIL Take 40 mg by mouth daily.   meloxicam 7.5 MG tablet Commonly known as: MOBIC Take 7.5 mg by mouth daily.   omeprazole 20 MG tablet Commonly known as: PRILOSEC OTC Take 20 mg by mouth daily.   ondansetron 4 MG tablet Commonly known as: Zofran Take 1 tablet (4 mg total) by mouth every 8 (eight) hours as needed for nausea or vomiting.   predniSONE 20 MG tablet Commonly known as: DELTASONE Take 2 tablets (40 mg total) by mouth daily for 5 days.   Trelegy Ellipta 200-62.5-25 MCG/INH Aepb Generic drug: Fluticasone-Umeclidin-Vilant Take 1 puff by mouth daily.   TURMERIC PO Take by mouth daily.   Ventolin HFA 108 (90 Base) MCG/ACT inhaler Generic drug: albuterol Inhale 1-2 puffs into the lungs every 6 (six) hours as needed for wheezing or shortness of breath.         Allergies  Allergen Reactions   Penicillins Other (See Comments)    Has patient had a PCN reaction causing immediate rash, facial/tongue/throat swelling, SOB or lightheadedness with hypotension:##Yes# but pt states he is ok with amoxicillin. Has patient had a PCN reaction causing severe rash involving mucus membranes or skin necrosis: No Has patient had a PCN reaction that required hospitalization: No Has patient had a PCN reaction occurring within the last 10 years: No If all of the above answers are "NO", then may proceed with Cephalosporin use.      Consultations: None   Procedures/Studies: DG Chest Port 1 View  Result Date: 06/14/2021 CLINICAL DATA:  Shortness of breath, fever EXAM: PORTABLE CHEST 1  VIEW COMPARISON:  06/28/2020 FINDINGS: There is hyperinflation of the lungs compatible with COPD. Interstitial and airspace opacities throughout the right lung, most pronounced peripherally in the right upper lobe and at right lung base. No confluent opacity on the left. Heart is normal size. No effusions. IMPRESSION: Interstitial and patchy airspace opacities throughout the right lung could reflect pneumonia. COPD. Electronically Signed   By: Rolm Baptise M.D.   On: 06/14/2021 10:21   ECHOCARDIOGRAM COMPLETE  Result Date: 06/16/2021    ECHOCARDIOGRAM REPORT   Patient Name:   Jeremy [name] Date of Exam: 06/16/2021 Medical Rec #:  [id number]         Height:       71.0 in Accession #:    [id number]        Weight:       185.0 lb Date of Birth:  [birth year]         BSA:          2.040 m Patient Age:    70 years          BP:           151/73 mmHg Patient Gender: M  HR:           78 bpm. Exam Location:  [facility] Procedure: 2D Echo, Cardiac Doppler and Color Doppler Indications:    Acute Respiratory Distress R06.03  History:        Patient has no prior history of Echocardiogram examinations.                 Risk Factors:Hypertension. GERD. Alcohol abuse.  Sonographer:    Jonelle Sidle Dance Referring Phys: [id number] [place]  1. Left ventricular ejection fraction, by estimation, is 60 to 65%. The left ventricle has normal function. The left ventricle has no regional wall motion abnormalities. Left ventricular diastolic parameters were normal.  2. Right ventricular systolic function is normal. The right ventricular size is normal. Tricuspid regurgitation signal is inadequate for assessing PA pressure.  3. The mitral valve is normal in structure. No evidence of mitral valve regurgitation. No evidence of mitral stenosis.  4. The aortic valve is normal in structure. Aortic valve regurgitation is not visualized. No aortic stenosis is present.  5. The inferior vena cava is dilated in size with >50%  respiratory variability, suggesting right atrial pressure of 8 mmHg. FINDINGS  Left Ventricle: Left ventricular ejection fraction, by estimation, is 60 to 65%. The left ventricle has normal function. The left ventricle has no regional wall motion abnormalities. The left ventricular internal cavity size was normal in size. There is  no left ventricular hypertrophy. Left ventricular diastolic parameters were normal. Normal left ventricular filling pressure. Right Ventricle: The right ventricular size is normal. No increase in right ventricular wall thickness. Right ventricular systolic function is normal. Tricuspid regurgitation signal is inadequate for assessing PA pressure. Left Atrium: Left atrial size was normal in size. Right Atrium: Right atrial size was normal in size. Pericardium: There is no evidence of pericardial effusion. Mitral Valve: The mitral valve is normal in structure. No evidence of mitral valve regurgitation. No evidence of mitral valve stenosis. Tricuspid Valve: The tricuspid valve is normal in structure. Tricuspid valve regurgitation is not demonstrated. No evidence of tricuspid stenosis. Aortic Valve: The aortic valve is normal in structure. Aortic valve regurgitation is not visualized. No aortic stenosis is present. Pulmonic Valve: The pulmonic valve was normal in structure. Pulmonic valve regurgitation is not visualized. No evidence of pulmonic stenosis. Aorta: The aortic root is normal in size and structure. Venous: The inferior vena cava is dilated in size with greater than 50% respiratory variability, suggesting right atrial pressure of 8 mmHg. IAS/Shunts: No atrial level shunt detected by color flow Doppler.  LEFT VENTRICLE PLAX 2D LVIDd:         4.84 cm     Diastology LVIDs:         3.31 cm     LV e' medial:    10.90 cm/s LV PW:         1.13 cm     LV E/e' medial:  8.3 LV IVS:        1.06 cm     LV e' lateral:   10.10 cm/s LVOT diam:     2.10 cm     LV E/e' lateral: 8.9 LV SV:         75  LV SV Index:   37 LVOT Area:     3.46 cm  LV Volumes (MOD) LV vol d, MOD A4C: 46.1 ml LV SV MOD A4C:     46.1 ml RIGHT VENTRICLE RV Basal diam:  2.93 cm RV S  prime:     15.00 cm/s TAPSE (M-mode): 2.6 cm LEFT ATRIUM             Index       RIGHT ATRIUM           Index LA diam:        3.80 cm 1.86 cm/m  RA Area:     16.00 cm LA Vol (A2C):   43.3 ml 21.23 ml/m RA Volume:   44.70 ml  21.91 ml/m LA Vol (A4C):   40.6 ml 19.90 ml/m LA Biplane Vol: 44.8 ml 21.96 ml/m  AORTIC VALVE LVOT Vmax:   109.00 cm/s LVOT Vmean:  74.300 cm/s LVOT VTI:    0.216 m  AORTA Ao Root diam: 3.60 cm Ao Asc diam:  3.60 cm MITRAL VALVE MV Area (PHT): 3.53 cm    SHUNTS MV Decel Time: 215 msec    Systemic VTI:  0.22 m MV E velocity: 90.00 cm/s  Systemic Diam: 2.10 cm MV A velocity: 77.10 cm/s MV E/A ratio:  1.17 Mihai Croitoru MD Electronically signed by Sanda Klein MD Signature Date/Time: 06/16/2021/5:29:40 PM    Final      Discharge Exam: Vitals:   06/16/21 1408 06/16/21 1628  BP:  129/64  Pulse:  76  Resp:  16  Temp:  98.1 F (36.7 C)  SpO2: 97% 97%   Vitals:   06/16/21 0738 06/16/21 0743 06/16/21 1408 06/16/21 1628  BP:    129/64  Pulse:    76  Resp:    16  Temp:    98.1 F (36.7 C)  TempSrc:    Oral  SpO2: 99% 99% 97% 97%  Weight:      Height:        General: Pt is alert, awake, not in acute distress Cardiovascular: RRR, S1/S2 +, no rubs, no gallops Respiratory: CTA bilaterally, no wheezing, no rhonchi,  Abdominal: Soft, NT, ND, bowel sounds + Extremities: no edema, no cyanosis    The results of significant diagnostics from this hospitalization (including imaging, microbiology, ancillary and laboratory) are listed below for reference.     Microbiology: Recent Results (from the past 240 hour(s))  Resp Panel by RT-PCR (Flu A&B, Covid) Nasopharyngeal Swab     Status: None   Collection Time: 06/14/21 10:03 AM   Specimen: Nasopharyngeal Swab; Nasopharyngeal(NP) swabs in vial transport medium   Result Value Ref Range Status   SARS Coronavirus 2 by RT PCR NEGATIVE NEGATIVE Final    Comment: (NOTE) SARS-CoV-2 target nucleic acids are NOT DETECTED.  The SARS-CoV-2 RNA is generally detectable in upper respiratory specimens during the acute phase of infection. The lowest concentration of SARS-CoV-2 viral copies this assay can detect is 138 copies/mL. A negative result does not preclude SARS-Cov-2 infection and should not be used as the sole basis for treatment or other patient management decisions. A negative result may occur with  improper specimen collection/handling, submission of specimen other than nasopharyngeal swab, presence of viral mutation(s) within the areas targeted by this assay, and inadequate number of viral copies(<138 copies/mL). A negative result must be combined with clinical observations, patient history, and epidemiological information. The expected result is Negative.  Fact Sheet for Patients:  EntrepreneurPulse.com.au  Fact Sheet for Healthcare Providers:  IncredibleEmployment.be  This test is no t yet approved or cleared by the [place] FDA and  has been authorized for detection and/or diagnosis of SARS-CoV-2 by FDA under an Emergency Use Authorization (EUA). This EUA will remain  in effect (  meaning this test can be used) for the duration of the COVID-19 declaration under Section 564(b)(1) of the Act, 21 U.S.C.section 360bbb-3(b)(1), unless the authorization is terminated  or revoked sooner.       Influenza A by PCR NEGATIVE NEGATIVE Final   Influenza B by PCR NEGATIVE NEGATIVE Final    Comment: (NOTE) The Xpert Xpress SARS-CoV-2/FLU/RSV plus assay is intended as an aid in the diagnosis of influenza from Nasopharyngeal swab specimens and should not be used as a sole basis for treatment. Nasal washings and aspirates are unacceptable for Xpert Xpress SARS-CoV-2/FLU/RSV testing.  Fact Sheet for  Patients: EntrepreneurPulse.com.au  Fact Sheet for Healthcare Providers: IncredibleEmployment.be  This test is not yet approved or cleared by the [place] FDA and has been authorized for detection and/or diagnosis of SARS-CoV-2 by FDA under an Emergency Use Authorization (EUA). This EUA will remain in effect (meaning this test can be used) for the duration of the COVID-19 declaration under Section 564(b)(1) of the Act, 21 U.S.C. section 360bbb-3(b)(1), unless the authorization is terminated or revoked.  Performed at [facility], [address], [place], [place] [address]   Culture, blood (Routine X 2) w Reflex to ID Panel     Status: None (Preliminary result)   Collection Time: 06/14/21 10:04 AM   Specimen: Right Antecubital; Blood  Result Value Ref Range Status   Specimen Description   Final    RIGHT ANTECUBITAL BOTTLES DRAWN AEROBIC AND ANAEROBIC   Special Requests   Final    Blood Culture adequate volume Performed at [facility], [address] [place], [place] [address]    Culture PENDING  Incomplete   Report Status PENDING  Incomplete  Culture, blood (Routine X 2) w Reflex to ID Panel     Status: None (Preliminary result)   Collection Time: 06/14/21 10:22 AM   Specimen: BLOOD LEFT ARM  Result Value Ref Range Status   Specimen Description BLOOD LEFT ARM BOTTLES DRAWN AEROBIC AND ANAEROBIC  Final   Special Requests   Final    Blood Culture adequate volume Performed at [facility], [address] [place], [place] [address]    Culture PENDING  Incomplete   Report Status PENDING  Incomplete     Labs: BNP (last 3 results) No results for input(s): BNP in the last 8760 hours. Basic Metabolic Panel: Recent Labs  Lab 06/14/21 1004 06/15/21 0606 06/16/21 0600  NA 127* 127* 125*  K 3.7 4.0 4.0  CL 91* 92* 89*  CO2 24 26 28   GLUCOSE 100* 120* 135*  BUN 10 11 11   CREATININE 0.76 0.65 0.62  CALCIUM 8.9 8.9 8.8*  MG  --  2.1 2.1    Liver Function Tests: Recent Labs  Lab 06/14/21 1004 06/15/21 0606 06/16/21 0600  AST 25 30 41  ALT 39 43 65*  ALKPHOS 88 81 73  BILITOT 1.1 0.6 0.5  PROT 7.3 6.7 6.8  ALBUMIN 3.7 3.3* 3.4*   No results for input(s): LIPASE, AMYLASE in the last 168 hours. No results for input(s): AMMONIA in the last 168 hours. CBC: Recent Labs  Lab 06/14/21 1004 06/15/21 0606 06/16/21 0600  WBC 10.9* 8.5 9.2  NEUTROABS 9.0*  --   --   HGB 15.9 15.6 15.8  HCT 45.6 44.1 45.4  MCV 101.1* 100.0 100.4*  PLT 346 354 388   Cardiac Enzymes: No results for input(s): CKTOTAL, CKMB, CKMBINDEX, TROPONINI in the last 168 hours. BNP: Invalid input(s): POCBNP CBG: No results for input(s): GLUCAP  in the last 168 hours. D-Dimer No results for input(s): DDIMER in the last 72 hours. Hgb A1c No results for input(s): HGBA1C in the last 72 hours. Lipid Profile No results for input(s): CHOL, HDL, LDLCALC, TRIG, CHOLHDL, LDLDIRECT in the last 72 hours. Thyroid function studies Recent Labs    06/16/21 0905  TSH 2.822   Anemia work up No results for input(s): VITAMINB12, FOLATE, FERRITIN, TIBC, IRON, RETICCTPCT in the last 72 hours. Urinalysis    Component Value Date/Time   COLORURINE YELLOW 06/14/2021 [place] 06/14/2021 1156   LABSPEC 1.013 06/14/2021 1156   PHURINE 7.0 06/14/2021 1156   GLUCOSEU NEGATIVE 06/14/2021 1156   HGBUR SMALL (A) 06/14/2021 1156   BILIRUBINUR NEGATIVE 06/14/2021 1156   KETONESUR 20 (A) 06/14/2021 1156   PROTEINUR NEGATIVE 06/14/2021 1156   NITRITE NEGATIVE 06/14/2021 1156   LEUKOCYTESUR NEGATIVE 06/14/2021 1156   Sepsis Labs Invalid input(s): PROCALCITONIN,  WBC,  LACTICIDVEN Microbiology Recent Results (from the past 240 hour(s))  Resp Panel by RT-PCR (Flu A&B, Covid) Nasopharyngeal Swab     Status: None   Collection Time: 06/14/21 10:03 AM   Specimen: Nasopharyngeal Swab; Nasopharyngeal(NP) swabs in vial transport medium  Result Value Ref  Range Status   SARS Coronavirus 2 by RT PCR NEGATIVE NEGATIVE Final    Comment: (NOTE) SARS-CoV-2 target nucleic acids are NOT DETECTED.  The SARS-CoV-2 RNA is generally detectable in upper respiratory specimens during the acute phase of infection. The lowest concentration of SARS-CoV-2 viral copies this assay can detect is 138 copies/mL. A negative result does not preclude SARS-Cov-2 infection and should not be used as the sole basis for treatment or other patient management decisions. A negative result may occur with  improper specimen collection/handling, submission of specimen other than nasopharyngeal swab, presence of viral mutation(s) within the areas targeted by this assay, and inadequate number of viral copies(<138 copies/mL). A negative result must be combined with clinical observations, patient history, and epidemiological information. The expected result is Negative.  Fact Sheet for Patients:  EntrepreneurPulse.com.au  Fact Sheet for Healthcare Providers:  IncredibleEmployment.be  This test is no t yet approved or cleared by the [place] FDA and  has been authorized for detection and/or diagnosis of SARS-CoV-2 by FDA under an Emergency Use Authorization (EUA). This EUA will remain  in effect (meaning this test can be used) for the duration of the COVID-19 declaration under Section 564(b)(1) of the Act, 21 U.S.C.section 360bbb-3(b)(1), unless the authorization is terminated  or revoked sooner.       Influenza A by PCR NEGATIVE NEGATIVE Final   Influenza B by PCR NEGATIVE NEGATIVE Final    Comment: (NOTE) The Xpert Xpress SARS-CoV-2/FLU/RSV plus assay is intended as an aid in the diagnosis of influenza from Nasopharyngeal swab specimens and should not be used as a sole basis for treatment. Nasal washings and aspirates are unacceptable for Xpert Xpress SARS-CoV-2/FLU/RSV testing.  Fact Sheet for  Patients: EntrepreneurPulse.com.au  Fact Sheet for Healthcare Providers: IncredibleEmployment.be  This test is not yet approved or cleared by the [place] FDA and has been authorized for detection and/or diagnosis of SARS-CoV-2 by FDA under an Emergency Use Authorization (EUA). This EUA will remain in effect (meaning this test can be used) for the duration of the COVID-19 declaration under Section 564(b)(1) of the Act, 21 U.S.C. section 360bbb-3(b)(1), unless the authorization is terminated or revoked.  Performed at [facility], [address], [place], [place] [address]   Culture, blood (Routine  X 2) w Reflex to ID Panel     Status: None (Preliminary result)   Collection Time: 06/14/21 10:04 AM   Specimen: Right Antecubital; Blood  Result Value Ref Range Status   Specimen Description   Final    RIGHT ANTECUBITAL BOTTLES DRAWN AEROBIC AND ANAEROBIC   Special Requests   Final    Blood Culture adequate volume Performed at [facility], [address] [place], [place] [address]    Culture PENDING  Incomplete   Report Status PENDING  Incomplete  Culture, blood (Routine X 2) w Reflex to ID Panel     Status: None (Preliminary result)   Collection Time: 06/14/21 10:22 AM   Specimen: BLOOD LEFT ARM  Result Value Ref Range Status   Specimen Description BLOOD LEFT ARM BOTTLES DRAWN AEROBIC AND ANAEROBIC  Final   Special Requests   Final    Blood Culture adequate volume Performed at [facility], [address] [place], [place] [address]    Culture PENDING  Incomplete   Report Status PENDING  Incomplete     Time coordinating discharge: 35 minutes  SIGNED:   Rodena Goldmann, DO [facility] 06/16/2021, 5:44 PM  If 7PM-7AM, please contact night-coverage [email or web address]

## 2021-06-16 ENCOUNTER — Observation Stay (HOSPITAL_BASED_OUTPATIENT_CLINIC_OR_DEPARTMENT_OTHER): Payer: Medicare HMO

## 2021-06-16 ENCOUNTER — Other Ambulatory Visit (HOSPITAL_COMMUNITY): Payer: Self-pay | Admitting: Internal Medicine

## 2021-06-16 DIAGNOSIS — R0603 Acute respiratory distress: Secondary | ICD-10-CM | POA: Diagnosis not present

## 2021-06-16 LAB — COMPREHENSIVE METABOLIC PANEL
ALT: 65 U/L — ABNORMAL HIGH (ref 0–44)
AST: 41 U/L (ref 15–41)
Albumin: 3.4 g/dL — ABNORMAL LOW (ref 3.5–5.0)
Alkaline Phosphatase: 73 U/L (ref 38–126)
Anion gap: 8 (ref 5–15)
BUN: 11 mg/dL (ref 8–23)
CO2: 28 mmol/L (ref 22–32)
Calcium: 8.8 mg/dL — ABNORMAL LOW (ref 8.9–10.3)
Chloride: 89 mmol/L — ABNORMAL LOW (ref 98–111)
Creatinine, Ser: 0.62 mg/dL (ref 0.61–1.24)
GFR, Estimated: >60 mL/min (ref >60)
Glucose, Bld: 135 mg/dL — ABNORMAL HIGH (ref 70–99)
Potassium: 4 mmol/L (ref 3.5–5.1)
Sodium: 125 mmol/L — ABNORMAL LOW (ref 135–145)
Total Bilirubin: 0.5 mg/dL (ref 0.3–1.2)
Total Protein: 6.8 g/dL (ref 6.5–8.1)

## 2021-06-16 LAB — CBC
HCT: 45.4 % (ref 39.0–52.0)
Hemoglobin: 15.8 g/dL (ref 13.0–17.0)
MCH: 35 pg — ABNORMAL HIGH (ref 26.0–34.0)
MCHC: 34.8 g/dL (ref 30.0–36.0)
MCV: 100.4 fL — ABNORMAL HIGH (ref 80.0–100.0)
Platelets: 388 10*3/uL (ref 150–400)
RBC: 4.52 MIL/uL (ref 4.22–5.81)
RDW: 12.4 % (ref 11.5–15.5)
WBC: 9.2 10*3/uL (ref 4.0–10.5)
nRBC: 0 % (ref 0.0–0.2)

## 2021-06-16 LAB — MAGNESIUM: Magnesium: 2.1 mg/dL (ref 1.7–2.4)

## 2021-06-16 LAB — OSMOLALITY, URINE: Osmolality, Ur: 309 mOsm/kg (ref 300–900)

## 2021-06-16 LAB — EXPECTORATED SPUTUM ASSESSMENT W GRAM STAIN, RFLX TO RESP C

## 2021-06-16 LAB — ECHOCARDIOGRAM COMPLETE
Area-P 1/2: 3.53 cm2
Height: 71 in
S' Lateral: 3.31 cm
Weight: 2960 oz

## 2021-06-16 LAB — OSMOLALITY: Osmolality: 270 mOsm/kg — ABNORMAL LOW (ref 275–295)

## 2021-06-16 LAB — TSH: TSH: 2.822 u[IU]/mL (ref 0.350–4.500)

## 2021-06-16 MED ORDER — GUAIFENESIN-DM 100-10 MG/5ML PO SYRP: 5.0000 mL | ORAL_SOLUTION | ORAL | Status: DC | PRN

## 2021-06-16 MED ORDER — PREDNISONE 20 MG PO TABS: 40.0000 mg | ORAL_TABLET | Freq: Every day | ORAL | 0 refills | Status: AC

## 2021-06-16 MED ORDER — CEFDINIR 300 MG PO CAPS: 300.0000 mg | ORAL_CAPSULE | Freq: Two times a day (BID) | ORAL | 0 refills | Status: AC

## 2021-06-16 NOTE — TOC Progression Note (Signed)
Transition of Care [facility]) - Progression Note    Patient Details  Name: Jeremy [name] MRN: [id number] Date of Birth: Mar 23, 1951  Transition of [facility]) CM/SW Contact  Natasha Bence, LCSW Phone Number: 06/16/2021, 12:48 PM  Clinical Narrative:    CSW received consult for SA. Pt reported that he did not have SA issues, but did struggle with nicotine withdraw from cigarettes. Patient reported that he would review list to see if they offered assistance with nicotine withdraw. TOC to follow.        Expected Discharge Plan and Services                                                 Social Determinants of Health (SDOH) Interventions    Readmission Risk Interventions No flowsheet data found.

## 2021-06-16 NOTE — Progress Notes (Signed)
Pt seen and evaluated today and is overall feeling much better and ready for discharge. 2D echocardiogram performed with no significant abnormalities noted. Please refer to DC summary on 7/15 for full details. Pt will be discharged on Omnicef and Prednisone as prescribed with close follow up to his pulmonologist Dr. Melvyn Novas. He appears to have pretty severe COPD with chronic bronchitis and is a high risk for readmission.  Total care time: 30 minutes.

## 2021-06-16 NOTE — Progress Notes (Signed)
CIWA of 5, prn ativan given 1mg  with relief. Jeremy [name] J>

## 2021-06-16 NOTE — Progress Notes (Signed)
  Echocardiogram 2D Echocardiogram has been performed.  Jeremy [name] 06/16/2021, 4:25 PM

## 2021-06-19 LAB — CULTURE, BLOOD (ROUTINE X 2)
Culture: NO GROWTH
Culture: NO GROWTH
Special Requests: ADEQUATE
Special Requests: ADEQUATE

## 2021-06-19 LAB — CULTURE, RESPIRATORY W GRAM STAIN: Culture: NORMAL

## 2021-06-27 ENCOUNTER — Emergency Department (HOSPITAL_COMMUNITY): Payer: Medicare HMO

## 2021-06-27 ENCOUNTER — Emergency Department (HOSPITAL_COMMUNITY)
Admission: EM | Admit: 2021-06-27 | Discharge: 2021-06-27 | Disposition: A | Discharge status: Home / Self Care | Payer: Medicare HMO | Attending: Emergency Medicine | Admitting: Emergency Medicine

## 2021-06-27 ENCOUNTER — Encounter (HOSPITAL_COMMUNITY): Payer: Self-pay | Admitting: *Deleted

## 2021-06-27 ENCOUNTER — Other Ambulatory Visit: Payer: Self-pay

## 2021-06-27 DIAGNOSIS — J45909 Unspecified asthma, uncomplicated: Secondary | ICD-10-CM | POA: Diagnosis not present

## 2021-06-27 DIAGNOSIS — J181 Lobar pneumonia, unspecified organism: Secondary | ICD-10-CM | POA: Diagnosis not present

## 2021-06-27 DIAGNOSIS — R918 Other nonspecific abnormal finding of lung field: Secondary | ICD-10-CM | POA: Insufficient documentation

## 2021-06-27 DIAGNOSIS — Z7982 Long term (current) use of aspirin: Secondary | ICD-10-CM | POA: Diagnosis not present

## 2021-06-27 DIAGNOSIS — Z79899 Other long term (current) drug therapy: Secondary | ICD-10-CM | POA: Insufficient documentation

## 2021-06-27 DIAGNOSIS — Z96642 Presence of left artificial hip joint: Secondary | ICD-10-CM | POA: Diagnosis not present

## 2021-06-27 DIAGNOSIS — Z20822 Contact with and (suspected) exposure to covid-19: Secondary | ICD-10-CM | POA: Diagnosis not present

## 2021-06-27 DIAGNOSIS — R509 Fever, unspecified: Secondary | ICD-10-CM | POA: Diagnosis present

## 2021-06-27 DIAGNOSIS — F17210 Nicotine dependence, cigarettes, uncomplicated: Secondary | ICD-10-CM | POA: Insufficient documentation

## 2021-06-27 DIAGNOSIS — J189 Pneumonia, unspecified organism: Secondary | ICD-10-CM

## 2021-06-27 DIAGNOSIS — I10 Essential (primary) hypertension: Secondary | ICD-10-CM | POA: Insufficient documentation

## 2021-06-27 LAB — BASIC METABOLIC PANEL
Anion gap: 7 (ref 5–15)
BUN: 8 mg/dL (ref 8–23)
CO2: 26 mmol/L (ref 22–32)
Calcium: 8.2 mg/dL — ABNORMAL LOW (ref 8.9–10.3)
Chloride: 96 mmol/L — ABNORMAL LOW (ref 98–111)
Creatinine, Ser: 0.68 mg/dL (ref 0.61–1.24)
GFR, Estimated: >60 mL/min (ref >60)
Glucose, Bld: 122 mg/dL — ABNORMAL HIGH (ref 70–99)
Potassium: 3.1 mmol/L — ABNORMAL LOW (ref 3.5–5.1)
Sodium: 129 mmol/L — ABNORMAL LOW (ref 135–145)

## 2021-06-27 LAB — CBC WITH DIFFERENTIAL/PLATELET
Abs Immature Granulocytes: 0.16 10*3/uL — ABNORMAL HIGH (ref 0.00–0.07)
Basophils Absolute: 0.1 10*3/uL (ref 0.0–0.1)
Basophils Relative: 1 %
Eosinophils Absolute: 0.3 10*3/uL (ref 0.0–0.5)
Eosinophils Relative: 2 %
HCT: 42.9 % (ref 39.0–52.0)
Hemoglobin: 14.8 g/dL (ref 13.0–17.0)
Immature Granulocytes: 1 %
Lymphocytes Relative: 6 %
Lymphs Abs: 0.9 10*3/uL (ref 0.7–4.0)
MCH: 34.6 pg — ABNORMAL HIGH (ref 26.0–34.0)
MCHC: 34.5 g/dL (ref 30.0–36.0)
MCV: 100.2 fL — ABNORMAL HIGH (ref 80.0–100.0)
Monocytes Absolute: 1.5 10*3/uL — ABNORMAL HIGH (ref 0.1–1.0)
Monocytes Relative: 10 %
Neutro Abs: 12 10*3/uL — ABNORMAL HIGH (ref 1.7–7.7)
Neutrophils Relative %: 80 %
Platelets: 374 10*3/uL (ref 150–400)
RBC: 4.28 MIL/uL (ref 4.22–5.81)
RDW: 12.9 % (ref 11.5–15.5)
WBC: 15 10*3/uL — ABNORMAL HIGH (ref 4.0–10.5)
nRBC: 0 % (ref 0.0–0.2)

## 2021-06-27 LAB — URINALYSIS, ROUTINE W REFLEX MICROSCOPIC
Bilirubin Urine: NEGATIVE
Glucose, UA: NEGATIVE mg/dL
Hgb urine dipstick: NEGATIVE
Ketones, ur: NEGATIVE mg/dL
Leukocytes,Ua: NEGATIVE
Nitrite: NEGATIVE
Protein, ur: NEGATIVE mg/dL
Specific Gravity, Urine: 1.004 — ABNORMAL LOW (ref 1.005–1.030)
pH: 7 (ref 5.0–8.0)

## 2021-06-27 LAB — LACTIC ACID, PLASMA
Lactic Acid, Venous: 1.3 mmol/L (ref 0.5–1.9)
Lactic Acid, Venous: 1.4 mmol/L (ref 0.5–1.9)

## 2021-06-27 LAB — RESP PANEL BY RT-PCR (FLU A&B, COVID) ARPGX2
Influenza A by PCR: NEGATIVE
Influenza B by PCR: NEGATIVE
SARS Coronavirus 2 by RT PCR: NEGATIVE

## 2021-06-27 MED ORDER — LEVOFLOXACIN 500 MG PO TABS: 500.0000 mg | ORAL_TABLET | Freq: Every day | ORAL | 0 refills | Status: DC

## 2021-06-27 MED ORDER — SODIUM CHLORIDE 0.9 % IV BOLUS
1000.0000 mL | Freq: Once | INTRAVENOUS | Status: AC
  Administered 2021-06-27: 1000 mL via INTRAVENOUS

## 2021-06-27 MED ORDER — LEVOFLOXACIN 500 MG PO TABS
500.0000 mg | ORAL_TABLET | Freq: Once | ORAL | Status: AC
  Administered 2021-06-27: 500 mg via ORAL
  Filled 2021-06-27: qty 1

## 2021-06-27 NOTE — ED Triage Notes (Signed)
Pt was discharged a couple of weeks ago with pneumonia and has finished his antibiotics but started running a fever yesterday; pt states he gets sob with walking distance

## 2021-06-27 NOTE — ED Provider Notes (Signed)
  Face-to-face evaluation   History: He presents for evaluation of malaise and chills for several days, after stopping antibiotics, about 5 days ago.  He had been admitted and treated for pneumonia.  He has been eating well.  He is having some dyspnea on exertion at this time although not as bad as previously.  Physical exam: Alert elderly male who is conversant.  No shortness of breath with speaking.  He is not currently hypoxic, on room air.  No respiratory distress.  No extremity swelling.  Medical screening examination/treatment/procedure(s) were conducted as a shared visit with non-physician practitioner(s) and myself.  I personally evaluated the patient during the encounter    Daleen Bo, MD 06/28/21 1106

## 2021-06-27 NOTE — Discharge Instructions (Addendum)
Your CT scan today suggest that you may still have some residual infection in your right lung and therefore you are being prescribed additional antibiotics.  Take your next dose of Levaquin tomorrow evening.  Your CT scan is also suggestive of a possible mass in your left upper lung which is going to require further testing to further determine if this is a simple benign tumor or if there is an early lung cancer in this area.  You are being referred to Dr. Delton Coombes to run further tests of this finding.  Call his office tomorrow morning to arrange an appointment.

## 2021-06-27 NOTE — ED Provider Notes (Signed)
[facility] EMERGENCY DEPARTMENT Provider Note   CSN: [id number] Arrival date & time: 06/27/21  1425     History Chief Complaint  Patient presents with   Fever    Jeremy [name] is a 70 y.o. male.  The history is provided by the patient.  Fever Max temp prior to arrival:  100.5 Temp source:  Oral Severity:  Mild Onset quality:  Unable to specify Duration:  2 days (reports measuring one elevated temp 2 days ago at 110.5, since has had temps in the 99.7 or lower range) Timing:  Intermittent Progression:  Unchanged Relieved by:  Acetaminophen Worsened by:  Nothing Associated symptoms: chills and cough   Associated symptoms: no chest pain, no congestion, no diarrhea, no dysuria, no headaches, no myalgias, no nausea, no rash, no sore throat and no vomiting   Associated symptoms comment:  Mild nonproductive cough. Was dc'd from admission here on 7/15 for pneumonia.  Finished Omnicef last week. Risk factors: recent sickness       Past Medical History:  Diagnosis Date   Alcohol use    daily   Anxiety    hx (05/05/2018)   Arthritis    "all over" (05/05/2018)   Asthma    Chronic bronchitis ([place])    Dyspnea    Family history of adverse reaction to anesthesia    "mom would have nausea after OR"    GERD (gastroesophageal reflux disease)    History of hiatal hernia    Hypertension    Pneumonia X 1   PONV (postoperative nausea and vomiting)    Seasonal allergies    "spring, summer, fall"    Patient Active Problem List   Diagnosis Date Noted   Acute hypoxemic respiratory failure ([place]) 06/14/2021   Community acquired pneumonia 06/14/2021   Primary osteoarthritis of hip 05/05/2018   Alcohol use 03/30/2018   Primary osteoarthritis of left hip 03/30/2018   Essential hypertension 03/30/2018   History of lumbar surgery 03/30/2018   History of cervical spinal surgery 03/30/2018   Tobacco abuse disorder 03/30/2018    Past Surgical History:  Procedure Laterality Date    ANTERIOR CERVICAL DECOMP/DISCECTOMY FUSION Left    BACK SURGERY     COLONOSCOPY     JOINT REPLACEMENT     LAPAROSCOPIC CHOLECYSTECTOMY     LUMBAR DISC SURGERY  X 2   L4-5   NASAL POLYP EXCISION  ~ 1970   TONSILLECTOMY     TOTAL HIP ARTHROPLASTY Left 05/05/2018   TOTAL HIP ARTHROPLASTY Left 05/05/2018   Procedure: LEFT TOTAL HIP ARTHROPLASTY ANTERIOR APPROACH;  Surgeon: [name], MD;  Location: [facility];  Service: Orthopedics;  Laterality: Left;       History reviewed. No pertinent family history.  Social History   Tobacco Use   Smoking status: Every Day    Packs/day: 1.00    Years: 55.00    Pack years: 55.00    Types: Cigarettes, Pipe   Smokeless tobacco: Never  Vaping Use   Vaping Use: Never used  Substance Use Topics   Alcohol use: Yes    Alcohol/week: 14.0 standard drinks    Types: 14 Shots of liquor per week   Drug use: Never    Home Medications Prior to Admission medications   Medication Sig Start Date End Date Taking? Authorizing Provider  amLODipine (NORVASC) 10 MG tablet Take 10 mg by mouth daily. 07/28/17  Yes [provider]  aspirin EC 81 MG tablet Take 1 tablet (81 mg total) by  mouth 2 (two) times daily. For DVT prophylaxis Patient taking differently: Take 81 mg by mouth daily. For DVT prophylaxis 05/05/18  Yes Prudencio Burly III, PA-C  baclofen (LIORESAL) 10 MG tablet Take 1 tablet (10 mg total) by mouth 3 (three) times daily as needed for muscle spasms. 05/05/18  Yes Prudencio Burly III, PA-C  cholecalciferol (VITAMIN D) 1000 units tablet Take 1,000 Units by mouth daily.   Yes [provider]  diltiazem (CARDIZEM CD) 240 MG 24 hr capsule Take 240 mg by mouth daily.  05/27/17  Yes [provider]  diphenoxylate-atropine (LOMOTIL) 2.5-0.025 MG tablet Take 1 tablet by mouth 4 (four) times daily as needed for diarrhea or loose stools.   Yes [provider]  Glucosamine HCl (GLUCOSAMINE PO) Take 2 tablets by  mouth daily.   Yes [provider]  levofloxacin (LEVAQUIN) 500 MG tablet Take 1 tablet (500 mg total) by mouth daily. 06/27/21  Yes Delphina Schum, Almyra Free, PA-C  lisinopril (PRINIVIL,ZESTRIL) 40 MG tablet Take 40 mg by mouth daily. 05/07/17  Yes [provider]  meloxicam (MOBIC) 7.5 MG tablet Take 7.5 mg by mouth daily. 07/08/17  Yes [provider]  omeprazole (PRILOSEC) 20 MG capsule Take 20 mg by mouth daily.   Yes [provider]  Donnal Debar 200-62.5-25 MCG/INH AEPB Take 1 puff by mouth daily. 05/19/21  Yes [provider]  TURMERIC PO Take by mouth daily.   Yes [provider]  VENTOLIN HFA 108 (90 Base) MCG/ACT inhaler Inhale 1-2 puffs into the lungs every 6 (six) hours as needed for wheezing or shortness of breath.  07/04/17  Yes [provider]  vitamin E 180 MG (400 UNITS) capsule Take 400 Units by mouth daily.   Yes [provider]    Allergies    Penicillins  Review of Systems   Review of Systems  Constitutional:  Positive for chills and fever.  HENT:  Negative for congestion and sore throat.   Respiratory:  Positive for cough.   Cardiovascular:  Negative for chest pain.  Gastrointestinal:  Negative for diarrhea, nausea and vomiting.  Genitourinary:  Negative for dysuria.  Musculoskeletal:  Negative for myalgias.  Skin:  Negative for rash.  Neurological:  Negative for headaches.   Physical Exam Updated Vital Signs BP (!) 137/56   Pulse 62   Temp 98.4 F (36.9 C) (Oral)   Resp 20   Ht 5\' 11"  (1.803 m)   Wt 83.2 kg   SpO2 97%   BMI 25.58 kg/m   Physical Exam Vitals and nursing note reviewed.  Constitutional:      Appearance: He is well-developed.  HENT:     Head: Normocephalic and atraumatic.     Mouth/Throat:     Mouth: Mucous membranes are moist.  Eyes:     Conjunctiva/sclera: Conjunctivae normal.  Cardiovascular:     Rate and Rhythm: Normal rate and regular rhythm.     Heart sounds: Normal heart  sounds.  Pulmonary:     Effort: Pulmonary effort is normal.     Breath sounds: Normal breath sounds. No wheezing.  Abdominal:     General: Bowel sounds are normal.     Palpations: Abdomen is soft.     Tenderness: There is no abdominal tenderness. There is no guarding.  Musculoskeletal:        General: Normal range of motion.     Cervical back: Normal range of motion.  Skin:    General: Skin is warm and  dry.  Neurological:     Mental Status: He is alert.    ED Results / Procedures / Treatments   Labs (all labs ordered are listed, but only abnormal results are displayed) Labs Reviewed  CBC WITH DIFFERENTIAL/PLATELET - Abnormal; Notable for the following components:      Result Value   WBC 15.0 (*)    MCV 100.2 (*)    MCH 34.6 (*)    Neutro Abs 12.0 (*)    Monocytes Absolute 1.5 (*)    Abs Immature Granulocytes 0.16 (*)    All other components within normal limits  BASIC METABOLIC PANEL - Abnormal; Notable for the following components:   Sodium 129 (*)    Potassium 3.1 (*)    Chloride 96 (*)    Glucose, Bld 122 (*)    Calcium 8.2 (*)    All other components within normal limits  URINALYSIS, ROUTINE W REFLEX MICROSCOPIC - Abnormal; Notable for the following components:   Color, Urine STRAW (*)    Specific Gravity, Urine 1.004 (*)    All other components within normal limits  RESP PANEL BY RT-PCR (FLU A&B, COVID) ARPGX2  CULTURE, BLOOD (ROUTINE X 2)  CULTURE, BLOOD (ROUTINE X 2)  LACTIC ACID, PLASMA  LACTIC ACID, PLASMA    EKG None  Radiology CT Chest Wo Contrast  Result Date: 06/27/2021 CLINICAL DATA:  Shortness of breath and fever. EXAM: CT CHEST WITHOUT CONTRAST TECHNIQUE: Multidetector CT imaging of the chest was performed following the standard protocol without IV contrast. COMPARISON:  None. FINDINGS: Cardiovascular: There is marked severity calcification of the aortic arch. Normal heart size with marked severity coronary artery calcification. No pericardial  effusion. Mediastinum/Nodes: No enlarged mediastinal or axillary lymph nodes. Thyroid gland, trachea, and esophagus demonstrate no significant findings. Lungs/Pleura: Marked severity emphysematous lung disease is seen involving predominantly the bilateral upper lobes. Marked severity, patchy irregular appearing airspace disease is seen within the lateral aspect of the right upper lobe. A 1.2 cm x 1.0 cm irregular appearing left upper lobe lung nodule is noted (axial CT image 52, CT series 4). Small areas of focal scarring are seen within the posteromedial aspect of the right upper lobe (axial CT image 50, CT series 4) and posterior aspect of the left upper lobe (axial CT image 42, CT series 4). Multiple 4 mm noncalcified lung nodules are seen within the posterior aspect of the left lower lobe (axial CT images 101, 103 and 107, CT series 4). A 2.7 cm x 1.0 cm pleural based area of low attenuation seen in the posterior aspect of the right lung base. There is no evidence of a pleural effusion or pneumothorax. Upper Abdomen: Surgical clips are seen within the gallbladder fossa. Musculoskeletal: Degenerative changes are seen throughout the thoracic spine. IMPRESSION: 1. Right upper lobe airspace disease which may be infectious in etiology. Sequelae associated with an underlying neoplasm cannot be excluded. 2. Irregular appearing left upper lobe lung nodule, worrisome for an underlying neoplastic process. Follow-up with nuclear medicine PET/CT is recommended. 3. Pleural based mass within the posterior right lung base which also requires further evaluation to exclude an underlying neoplasm. Electronically Signed   By: Virgina Norfolk M.D.   On: 06/27/2021 20:35   DG Chest Port 1 View  Result Date: 06/27/2021 CLINICAL DATA:  Short of breath.  Recent pneumonia EXAM: PORTABLE CHEST 1 VIEW COMPARISON:  06/14/2021 FINDINGS: Underlying COPD with hyperinflation lungs and prominent lung markings. Progression of pleural based  irregular thickening in the  right upper lobe laterally. Adjacent parenchymal airspace disease right upper lobe is unchanged. 1 cm nodular density left upper lobe. No pneumothorax. Possible small right pleural effusion IMPRESSION: COPD Progressive pleural based density right upper lobe. Patchy right upper lobe airspace disease unchanged. Possible 1 cm left upper lobe nodule. Consider chest CT to rule out right upper lobe neoplasm versus underlying pneumonia. Also attention left upper lobe nodule. Electronically Signed   By: Franchot Gallo M.D.   On: 06/27/2021 15:52    Procedures Procedures   Medications Ordered in ED Medications  sodium chloride 0.9 % bolus 1,000 mL (0 mLs Intravenous Stopped 06/27/21 2044)  levofloxacin (LEVAQUIN) tablet 500 mg (500 mg Oral Given 06/27/21 2110)    ED Course  I have reviewed the triage vital signs and the nursing notes.  Pertinent labs & imaging results that were available during my care of the patient were reviewed by me and considered in my medical decision making (see chart for details).    MDM Rules/Calculators/A&P                           Labs and imaging reviewed and discussed with patient and wife at bedside.  We proceeded with a chest CT given plain film x-rays concerning for right upper lobe airspace disease and concern for left upper lobe nodule.  Right upper lobe suggests possible ongoing pneumonia, underlying neoplasm cannot be ruled out.  There is also a small mass at his left upper lobe that is more concerning for possible neoplasm.  Discussed this finding with patient and wife at bedside.  He is given a referral to Dr. Delton Coombes for further evaluation and treatment as indicated.  Given continuing fevers, although patient has been afebrile here we will treat him with Levaquin, first dose was given here.  Blood cultures were originally collected, these are currently pending.  Discussed findings with Dr. Eulis Foster who helped formulate plan. Final  Clinical Impression(s) / ED Diagnoses Final diagnoses:  Community acquired pneumonia of right upper lobe of lung  Mass of left lung    Rx / DC Orders ED Discharge Orders          Ordered    levofloxacin (LEVAQUIN) 500 MG tablet  Daily        06/27/21 2051             Evalee Jefferson, PA-C 06/28/21 0033    Daleen Bo, MD 06/28/21 1106

## 2021-07-02 DIAGNOSIS — R911 Solitary pulmonary nodule: Secondary | ICD-10-CM | POA: Insufficient documentation

## 2021-07-02 LAB — CULTURE, BLOOD (ROUTINE X 2)
Culture: NO GROWTH
Culture: NO GROWTH
Special Requests: ADEQUATE

## 2021-07-02 NOTE — Progress Notes (Signed)
[facility] [address], [place] [address]   CLINIC:  Medical Oncology/Hematology  CONSULT NOTE  Patient Care Team: Redmond School, MD as PCP - General (Internal Medicine) Brien Mates, RN as Oncology Nurse Navigator (Oncology) Derek Jack, MD as Medical Oncologist (Oncology)  CHIEF COMPLAINTS/PURPOSE OF CONSULTATION:  Evaluation of lung mass  HISTORY OF PRESENTING ILLNESS:  [name] 70 y.o. male is here because of lung mass.  Today he reports feeling well and is accompanied by his wife. He was presented to the ED 07/27 where he was treated for pneumonia; he completes his course of antibiotics today. He has a cough that is productive of clear phlegm and denies the presence of any blood. This cough existed prior to the pneumonia, but it is worse with the infection. It has improved since his discharge from the hospital. He also reports SOB with exertion which he attributes to his history of COPD and smoking. He denies any weight loss or CP. He reports occasional muscle spasms in his right hand and swellings in his feet and ankles. He has had diarrhea since beginning antibiotics, but this has improved as of yesterday.   He was a smoker and smoked 1 ppd for about 62 years, but he has quit since his pneumonia infection. He is a retired Consulting civil engineer. He reports asbestos exposure working at [employer] for 5-6 years. His father had lung cancer, and he denies any other family history of cancer.   MEDICAL HISTORY:  Past Medical History:  Diagnosis Date   Alcohol use    daily   Anxiety    hx (05/05/2018)   Arthritis    "all over" (05/05/2018)   Asthma    Chronic bronchitis ([place])    Dyspnea    Family history of adverse reaction to anesthesia    "mom would have nausea after OR"    GERD (gastroesophageal reflux disease)    History of hiatal hernia    Hypertension    Pneumonia X 1    PONV (postoperative nausea and vomiting)    Seasonal allergies    "spring, summer, fall"    SURGICAL HISTORY: Past Surgical History:  Procedure Laterality Date   ANTERIOR CERVICAL DECOMP/DISCECTOMY FUSION Left    BACK SURGERY     COLONOSCOPY     JOINT REPLACEMENT     LAPAROSCOPIC CHOLECYSTECTOMY     LUMBAR DISC SURGERY  X 2   L4-5   NASAL POLYP EXCISION  ~ [facility] ARTHROPLASTY Left 05/05/2018   TOTAL HIP ARTHROPLASTY Left 05/05/2018   Procedure: LEFT TOTAL HIP ARTHROPLASTY ANTERIOR APPROACH;  Surgeon: [name], MD;  Location: [facility];  Service: Orthopedics;  Laterality: Left;    SOCIAL HISTORY: Social History   Socioeconomic History   Marital status: Married    Spouse name: Not on file   Number of children: 2   Years of education: Not on file   Highest education level: Not on file  Occupational History   Occupation: Retired  Tobacco Use   Smoking status: Former    Packs/day: 1.00    Years: 55.00    Pack years: 55.00    Types: Cigarettes, Pipe    Quit date: 06/13/2021    Years since quitting: 0.0   Smokeless tobacco: Never  Vaping Use   Vaping Use: Never used  Substance and Sexual Activity   Alcohol use: Yes  Alcohol/week: 7.0 standard drinks    Types: 7 Shots of liquor per week   Drug use: Never   Sexual activity: Not Currently  Other Topics Concern   Not on file  Social History Narrative   Not on file   Social Determinants of Health   Financial Resource Strain: Low Risk    Difficulty of Paying Living Expenses: Not hard at all  Food Insecurity: No Food Insecurity   Worried About Charity fundraiser in the Last Year: Never true   [place] in the Last Year: Never true  Transportation Needs: No Transportation Needs   Lack of Transportation (Medical): No   Lack of Transportation (Non-Medical): No  Physical Activity: Inactive   Days of Exercise per Week: 0 days   Minutes of Exercise per Session: 0 min  Stress: No  Stress Concern Present   Feeling of Stress : Not at all  Social Connections: Moderately Integrated   Frequency of Communication with Friends and Family: More than three times a week   Frequency of Social Gatherings with Friends and Family: More than three times a week   Attends Religious Services: Never   Marine scientist or Organizations: Yes   Attends Music therapist: More than 4 times per year   Marital Status: Married  Human resources officer Violence: Not At Risk   Fear of Current or Ex-Partner: No   Emotionally Abused: No   Physically Abused: No   Sexually Abused: No    FAMILY HISTORY: History reviewed. No pertinent family history.  ALLERGIES:  is allergic to penicillins and tape.  MEDICATIONS:  Current Outpatient Medications  Medication Sig Dispense Refill   amLODipine (NORVASC) 10 MG tablet Take 10 mg by mouth daily.     aspirin EC 81 MG tablet Take 1 tablet (81 mg total) by mouth 2 (two) times daily. For DVT prophylaxis (Patient taking differently: Take 81 mg by mouth daily. For DVT prophylaxis) 60 tablet 0   baclofen (LIORESAL) 10 MG tablet Take 1 tablet (10 mg total) by mouth 3 (three) times daily as needed for muscle spasms. (Patient not taking: Reported on 07/03/2021) 40 each 0   cholecalciferol (VITAMIN D) 1000 units tablet Take 1,000 Units by mouth daily.     diltiazem (CARDIZEM CD) 240 MG 24 hr capsule Take 240 mg by mouth daily.      diphenoxylate-atropine (LOMOTIL) 2.5-0.025 MG tablet Take 1 tablet by mouth 4 (four) times daily as needed for diarrhea or loose stools.     Glucosamine HCl (GLUCOSAMINE PO) Take 2 tablets by mouth daily.     levofloxacin (LEVAQUIN) 500 MG tablet Take 1 tablet (500 mg total) by mouth daily. 6 tablet 0   lisinopril (PRINIVIL,ZESTRIL) 40 MG tablet Take 40 mg by mouth daily.     meloxicam (MOBIC) 7.5 MG tablet Take 7.5 mg by mouth daily.     omeprazole (PRILOSEC) 20 MG capsule Take 20 mg by mouth daily.     TRELEGY ELLIPTA  200-62.5-25 MCG/INH AEPB Take 1 puff by mouth daily.     TURMERIC PO Take by mouth daily.     VENTOLIN HFA 108 (90 Base) MCG/ACT inhaler Inhale 1-2 puffs into the lungs every 6 (six) hours as needed for wheezing or shortness of breath.      vitamin E 180 MG (400 UNITS) capsule Take 400 Units by mouth daily.     No current facility-administered medications for this visit.    REVIEW OF SYSTEMS:  Review of Systems  Constitutional:  Positive for fatigue (50%). Negative for appetite change and unexpected weight change.  Respiratory:  Positive for cough (with exertion) and shortness of breath (w/ exertion).   Cardiovascular:  Positive for leg swelling (legs and feet). Negative for chest pain.  Gastrointestinal:  Positive for diarrhea.  Musculoskeletal:  Positive for myalgias (spasms in R hand).  Skin:  Positive for itching (dry skin patches on arms).  Neurological:  Positive for numbness (R leg and L hand).  Hematological:  Bruises/bleeds easily.  All other systems reviewed and are negative.   PHYSICAL EXAMINATION: ECOG PERFORMANCE STATUS: 1 - Symptomatic but completely ambulatory  Vitals:   07/03/21 0820  BP: (!) 165/75  Pulse: 79  Resp: 16  Temp: 98.9 F (37.2 C)  SpO2: 99%   Filed Weights   07/03/21 0820  Weight: 185 lb 8 oz (84.1 kg)   Physical Exam Vitals reviewed.  Constitutional:      Appearance: Normal appearance.  Cardiovascular:     Rate and Rhythm: Normal rate and regular rhythm.     Pulses: Normal pulses.     Heart sounds: Normal heart sounds.  Pulmonary:     Effort: Pulmonary effort is normal.     Breath sounds: Normal breath sounds.  Chest:  Breasts:    Right: No axillary adenopathy.     Left: No axillary adenopathy.  Musculoskeletal:     Right lower leg: No edema.     Left lower leg: No edema.  Lymphadenopathy:     Upper Body:     Right upper body: No axillary or pectoral adenopathy.     Left upper body: No axillary or pectoral adenopathy.   Neurological:     General: No focal deficit present.     Mental Status: He is alert and oriented to person, place, and time.  Psychiatric:        Mood and Affect: Mood normal.        Behavior: Behavior normal.     LABORATORY DATA:  I have reviewed the data as listed CBC Latest Ref Rng & Units 06/27/2021 06/16/2021 06/15/2021  WBC 4.0 - 10.5 K/uL 15.0(H) 9.2 8.5  Hemoglobin 13.0 - 17.0 g/dL 14.8 15.8 15.6  Hematocrit 39.0 - 52.0 % 42.9 45.4 44.1  Platelets 150 - 400 K/uL 374 388 354   CMP Latest Ref Rng & Units 06/27/2021 06/16/2021 06/15/2021  Glucose 70 - 99 mg/dL 122(H) 135(H) 120(H)  BUN 8 - 23 mg/dL 8 11 11   Creatinine 0.61 - 1.24 mg/dL 0.68 0.62 0.65  Sodium 135 - 145 mmol/L 129(L) 125(L) 127(L)  Potassium 3.5 - 5.1 mmol/L 3.1(L) 4.0 4.0  Chloride 98 - 111 mmol/L 96(L) 89(L) 92(L)  CO2 22 - 32 mmol/L 26 28 26   Calcium 8.9 - 10.3 mg/dL 8.2(L) 8.8(L) 8.9  Total Protein 6.5 - 8.1 g/dL - 6.8 6.7  Total Bilirubin 0.3 - 1.2 mg/dL - 0.5 0.6  Alkaline Phos 38 - 126 U/L - 73 81  AST 15 - 41 U/L - 41 30  ALT 0 - 44 U/L - 65(H) 43    RADIOGRAPHIC STUDIES: I have personally reviewed the radiological images as listed and agreed with the findings in the report. CT Chest Wo Contrast  Result Date: 06/27/2021 CLINICAL DATA:  Shortness of breath and fever. EXAM: CT CHEST WITHOUT CONTRAST TECHNIQUE: Multidetector CT imaging of the chest was performed following the standard protocol without IV contrast. COMPARISON:  None. FINDINGS: Cardiovascular: There is marked severity calcification  of the aortic arch. Normal heart size with marked severity coronary artery calcification. No pericardial effusion. Mediastinum/Nodes: No enlarged mediastinal or axillary lymph nodes. Thyroid gland, trachea, and esophagus demonstrate no significant findings. Lungs/Pleura: Marked severity emphysematous lung disease is seen involving predominantly the bilateral upper lobes. Marked severity, patchy irregular appearing  airspace disease is seen within the lateral aspect of the right upper lobe. A 1.2 cm x 1.0 cm irregular appearing left upper lobe lung nodule is noted (axial CT image 52, CT series 4). Small areas of focal scarring are seen within the posteromedial aspect of the right upper lobe (axial CT image 50, CT series 4) and posterior aspect of the left upper lobe (axial CT image 42, CT series 4). Multiple 4 mm noncalcified lung nodules are seen within the posterior aspect of the left lower lobe (axial CT images 101, 103 and 107, CT series 4). A 2.7 cm x 1.0 cm pleural based area of low attenuation seen in the posterior aspect of the right lung base. There is no evidence of a pleural effusion or pneumothorax. Upper Abdomen: Surgical clips are seen within the gallbladder fossa. Musculoskeletal: Degenerative changes are seen throughout the thoracic spine. IMPRESSION: 1. Right upper lobe airspace disease which may be infectious in etiology. Sequelae associated with an underlying neoplasm cannot be excluded. 2. Irregular appearing left upper lobe lung nodule, worrisome for an underlying neoplastic process. Follow-up with nuclear medicine PET/CT is recommended. 3. Pleural based mass within the posterior right lung base which also requires further evaluation to exclude an underlying neoplasm. Electronically Signed   By: Virgina Norfolk M.D.   On: 06/27/2021 20:35   DG Chest Port 1 View  Result Date: 06/27/2021 CLINICAL DATA:  Short of breath.  Recent pneumonia EXAM: PORTABLE CHEST 1 VIEW COMPARISON:  06/14/2021 FINDINGS: Underlying COPD with hyperinflation lungs and prominent lung markings. Progression of pleural based irregular thickening in the right upper lobe laterally. Adjacent parenchymal airspace disease right upper lobe is unchanged. 1 cm nodular density left upper lobe. No pneumothorax. Possible small right pleural effusion IMPRESSION: COPD Progressive pleural based density right upper lobe. Patchy right upper lobe  airspace disease unchanged. Possible 1 cm left upper lobe nodule. Consider chest CT to rule out right upper lobe neoplasm versus underlying pneumonia. Also attention left upper lobe nodule. Electronically Signed   By: Franchot Gallo M.D.   On: 06/27/2021 15:52   DG Chest Port 1 View  Result Date: 06/14/2021 CLINICAL DATA:  Shortness of breath, fever EXAM: PORTABLE CHEST 1 VIEW COMPARISON:  06/28/2020 FINDINGS: There is hyperinflation of the lungs compatible with COPD. Interstitial and airspace opacities throughout the right lung, most pronounced peripherally in the right upper lobe and at right lung base. No confluent opacity on the left. Heart is normal size. No effusions. IMPRESSION: Interstitial and patchy airspace opacities throughout the right lung could reflect pneumonia. COPD. Electronically Signed   By: Rolm Baptise M.D.   On: 06/14/2021 10:21   ECHOCARDIOGRAM COMPLETE  Result Date: 06/16/2021    ECHOCARDIOGRAM REPORT   Patient Name:   KELLAN RAFFIELD Date of Exam: 06/16/2021 Medical Rec #:  [id number]         Height:       71.0 in Accession #:    [id number]        Weight:       185.0 lb Date of Birth:  08-23-1951         BSA:  2.040 m Patient Age:    31 years          BP:           151/73 mmHg Patient Gender: M                 HR:           78 bpm. Exam Location:  [facility] Procedure: 2D Echo, Cardiac Doppler and Color Doppler Indications:    Acute Respiratory Distress R06.03  History:        Patient has no prior history of Echocardiogram examinations.                 Risk Factors:Hypertension. GERD. Alcohol abuse.  Sonographer:    Jonelle Sidle Dance Referring Phys: [id number] [place]  1. Left ventricular ejection fraction, by estimation, is 60 to 65%. The left ventricle has normal function. The left ventricle has no regional wall motion abnormalities. Left ventricular diastolic parameters were normal.  2. Right ventricular systolic function is normal. The right ventricular size  is normal. Tricuspid regurgitation signal is inadequate for assessing PA pressure.  3. The mitral valve is normal in structure. No evidence of mitral valve regurgitation. No evidence of mitral stenosis.  4. The aortic valve is normal in structure. Aortic valve regurgitation is not visualized. No aortic stenosis is present.  5. The inferior vena cava is dilated in size with >50% respiratory variability, suggesting right atrial pressure of 8 mmHg. FINDINGS  Left Ventricle: Left ventricular ejection fraction, by estimation, is 60 to 65%. The left ventricle has normal function. The left ventricle has no regional wall motion abnormalities. The left ventricular internal cavity size was normal in size. There is  no left ventricular hypertrophy. Left ventricular diastolic parameters were normal. Normal left ventricular filling pressure. Right Ventricle: The right ventricular size is normal. No increase in right ventricular wall thickness. Right ventricular systolic function is normal. Tricuspid regurgitation signal is inadequate for assessing PA pressure. Left Atrium: Left atrial size was normal in size. Right Atrium: Right atrial size was normal in size. Pericardium: There is no evidence of pericardial effusion. Mitral Valve: The mitral valve is normal in structure. No evidence of mitral valve regurgitation. No evidence of mitral valve stenosis. Tricuspid Valve: The tricuspid valve is normal in structure. Tricuspid valve regurgitation is not demonstrated. No evidence of tricuspid stenosis. Aortic Valve: The aortic valve is normal in structure. Aortic valve regurgitation is not visualized. No aortic stenosis is present. Pulmonic Valve: The pulmonic valve was normal in structure. Pulmonic valve regurgitation is not visualized. No evidence of pulmonic stenosis. Aorta: The aortic root is normal in size and structure. Venous: The inferior vena cava is dilated in size with greater than 50% respiratory variability, suggesting  right atrial pressure of 8 mmHg. IAS/Shunts: No atrial level shunt detected by color flow Doppler.  LEFT VENTRICLE PLAX 2D LVIDd:         4.84 cm     Diastology LVIDs:         3.31 cm     LV e' medial:    10.90 cm/s LV PW:         1.13 cm     LV E/e' medial:  8.3 LV IVS:        1.06 cm     LV e' lateral:   10.10 cm/s LVOT diam:     2.10 cm     LV E/e' lateral: 8.9 LV SV:  75 LV SV Index:   37 LVOT Area:     3.46 cm  LV Volumes (MOD) LV vol d, MOD A4C: 46.1 ml LV SV MOD A4C:     46.1 ml RIGHT VENTRICLE RV Basal diam:  2.93 cm RV S prime:     15.00 cm/s TAPSE (M-mode): 2.6 cm LEFT ATRIUM             Index       RIGHT ATRIUM           Index LA diam:        3.80 cm 1.86 cm/m  RA Area:     16.00 cm LA Vol (A2C):   43.3 ml 21.23 ml/m RA Volume:   44.70 ml  21.91 ml/m LA Vol (A4C):   40.6 ml 19.90 ml/m LA Biplane Vol: 44.8 ml 21.96 ml/m  AORTIC VALVE LVOT Vmax:   109.00 cm/s LVOT Vmean:  74.300 cm/s LVOT VTI:    0.216 m  AORTA Ao Root diam: 3.60 cm Ao Asc diam:  3.60 cm MITRAL VALVE MV Area (PHT): 3.53 cm    SHUNTS MV Decel Time: 215 msec    Systemic VTI:  0.22 m MV E velocity: 90.00 cm/s  Systemic Diam: 2.10 cm MV A velocity: 77.10 cm/s MV E/A ratio:  1.17 Mihai Croitoru MD Electronically signed by Sanda Klein MD Signature Date/Time: 06/16/2021/5:29:40 PM    Final     ASSESSMENT:  1.  Left lung nodule and a possible right lung mass: -He was hospitalized from 06/14/2021 through 06/16/2021 with pneumonia and was treated with Omnicef. - He presented to the ER on 06/27/2021 with fever. - CT chest without contrast on 06/27/2021 showed patchy irregular-appearing airspace disease seen within the lateral aspect of the right upper lobe.  A 1.2 x 1 cm irregular left upper lobe nodule noted.  Several areas of focal scarring within the posteromedial aspect of the right upper lobe and posterior aspect of the left upper lobe.  Multiple 4 mm noncalcified lung nodule seen within the posterior aspect of the left lower  lobe.  2.7 x 1.0 cm pleural-based area of low-attenuation seen in the posterior aspect of the right lung base. - He was sent home on 7 days of Levaquin.  He has 1 more day left. - Reports improvement in the cough and expectoration. - Denies any weight loss.  No fever since last week.  No chest pains reported.  2.  Social/family history: - Lives with wife at home. - He quit smoking on 06/13/2021.  Prior to that he smoked 1 pack/day for 62 years. - Has exposure to asbestos on and off for 5 to 6 years. 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 - Father had lung cancer.  PLAN:  1.  Bilateral lung masses: -We have reviewed CT images with the patient and his wife.  We have discussed differential diagnosis including malignancy in detail. - Right upper lobe abnormality could be from pneumonia or superimposed pneumonia on the lung lesion.  Right lower lobe pleural-based lesion is significant because of his asbestos exposure. - I would wait for a couple of weeks and schedule him for PET scan for further evaluation.  We will also schedule him for PFTs in 3 to 4 weeks. - RTC 3 weeks to discuss PET scan and possible biopsy.  2.  COPD: - He reports that his breathing is slightly improved since he stopped smoking. - Continue Trelegy daily and  albuterol 3 times daily. - He has an appointment to see Dr. Melvyn Novas in 3 weeks.  All questions were answered. The patient knows to call the clinic with any problems, questions or concerns.   Derek Jack, MD, 07/03/21 9:04 AM  [facility] [place] [phone]   I, Thana Ates, am acting as a scribe for Dr. Derek Jack.  I, Derek Jack MD, have reviewed the above documentation for accuracy and completeness, and I agree with the above.

## 2021-07-03 ENCOUNTER — Inpatient Hospital Stay (HOSPITAL_COMMUNITY): Payer: Medicare HMO | Attending: Hematology | Admitting: Hematology

## 2021-07-03 ENCOUNTER — Other Ambulatory Visit: Payer: Self-pay

## 2021-07-03 ENCOUNTER — Encounter (HOSPITAL_COMMUNITY): Payer: Self-pay | Admitting: Hematology

## 2021-07-03 VITALS — BP 165/75 | HR 79 | Temp 98.9°F | Resp 16 | Ht 70.0 in | Wt 185.5 lb

## 2021-07-03 DIAGNOSIS — R911 Solitary pulmonary nodule: Secondary | ICD-10-CM | POA: Diagnosis not present

## 2021-07-03 DIAGNOSIS — M47814 Spondylosis without myelopathy or radiculopathy, thoracic region: Secondary | ICD-10-CM | POA: Insufficient documentation

## 2021-07-03 DIAGNOSIS — Z79899 Other long term (current) drug therapy: Secondary | ICD-10-CM | POA: Insufficient documentation

## 2021-07-03 DIAGNOSIS — Z96642 Presence of left artificial hip joint: Secondary | ICD-10-CM | POA: Insufficient documentation

## 2021-07-03 DIAGNOSIS — I10 Essential (primary) hypertension: Secondary | ICD-10-CM | POA: Insufficient documentation

## 2021-07-03 DIAGNOSIS — M47812 Spondylosis without myelopathy or radiculopathy, cervical region: Secondary | ICD-10-CM | POA: Insufficient documentation

## 2021-07-03 DIAGNOSIS — Z87891 Personal history of nicotine dependence: Secondary | ICD-10-CM | POA: Insufficient documentation

## 2021-07-03 DIAGNOSIS — M5136 Other intervertebral disc degeneration, lumbar region: Secondary | ICD-10-CM | POA: Insufficient documentation

## 2021-07-03 DIAGNOSIS — C3490 Malignant neoplasm of unspecified part of unspecified bronchus or lung: Secondary | ICD-10-CM | POA: Diagnosis not present

## 2021-07-03 DIAGNOSIS — K219 Gastro-esophageal reflux disease without esophagitis: Secondary | ICD-10-CM | POA: Insufficient documentation

## 2021-07-03 DIAGNOSIS — I2510 Atherosclerotic heart disease of native coronary artery without angina pectoris: Secondary | ICD-10-CM | POA: Insufficient documentation

## 2021-07-03 DIAGNOSIS — J449 Chronic obstructive pulmonary disease, unspecified: Secondary | ICD-10-CM | POA: Diagnosis not present

## 2021-07-03 DIAGNOSIS — R918 Other nonspecific abnormal finding of lung field: Secondary | ICD-10-CM | POA: Insufficient documentation

## 2021-07-03 DIAGNOSIS — Z8701 Personal history of pneumonia (recurrent): Secondary | ICD-10-CM | POA: Insufficient documentation

## 2021-07-03 NOTE — Progress Notes (Signed)
I met with the patient and his wife during and after visit with Dr. Delton Coombes. I introduced myself and explained my role in the patient's care. I provided the patient and his wife with my contact information and encouraged them to call with any questions or concerns. At this time, all questions were addressed and answered.

## 2021-07-03 NOTE — Patient Instructions (Addendum)
[facility] at [facility] Discharge Instructions  You were seen and examined today by Dr. Delton Coombes. Dr. Delton Coombes discussed your past medical history, family history of cancers and the events that led to you being here today.  Dr. Delton Coombes is a medical oncologist, meaning he specializes in the management of cancer diagnoses. During your recent Emergency Department visit, your CT scan revealed a few areas of concern on your lung. It is possible that some of this was related to your recent pneumonia, but it is concerning that it is not solely related to your recent pneumonia.  Dr. Delton Coombes has recommended a PET scan. A PET scan is a specialized CT scan of your entire body that literally illuminates where there is cancer present in the body. This will tell us if it is definitively cancer present in your lung and if it is anywhere else outside of your lungs. This will be done in about 2 weeks so that your body has time to get over the pneumonia.  Follow-up as scheduled after PET scan.   Thank you for choosing [facility] at [facility] to provide your oncology and hematology care.  To afford each patient quality time with our provider, please arrive at least 15 minutes before your scheduled appointment time.   If you have a lab appointment with the [facility] please come in thru the Main Entrance and check in at the main information desk.  You need to re-schedule your appointment should you arrive 10 or more minutes late.  We strive to give you quality time with our providers, and arriving late affects you and other patients whose appointments are after yours.  Also, if you no show three or more times for appointments you may be dismissed from the clinic at the providers discretion.     Again, thank you for choosing [facility].  Our hope is that these requests will decrease the amount of time that you wait before being seen by our  physicians.       _____________________________________________________________  Should you have questions after your visit to [facility], please contact our office at [phone] and follow the prompts.  Our office hours are 8:00 a.m. and 4:30 p.m. Monday - Friday.  Please note that voicemails left after 4:00 p.m. may not be returned until the following business day.  We are closed weekends and major holidays.  You do have access to a nurse 24-7, just call the main number to the clinic [phone] and do not press any options, hold on the line and a nurse will answer the phone.    For prescription refill requests, have your pharmacy contact our office and allow 72 hours.    Due to Covid, you will need to wear a mask upon entering the hospital. If you do not have a mask, a mask will be given to you at the Main Entrance upon arrival. For doctor visits, patients may have 1 support person age 81 or older with them. For treatment visits, patients can not have anyone with them due to social distancing guidelines and our immunocompromised population.

## 2021-07-19 ENCOUNTER — Encounter (HOSPITAL_COMMUNITY)
Admission: RE | Admit: 2021-07-19 | Discharge: 2021-07-19 | Disposition: A | Discharge status: Home / Self Care | Payer: Medicare HMO | Source: Ambulatory Visit | Attending: Hematology | Admitting: Hematology

## 2021-07-19 ENCOUNTER — Other Ambulatory Visit: Payer: Self-pay

## 2021-07-19 DIAGNOSIS — C3490 Malignant neoplasm of unspecified part of unspecified bronchus or lung: Secondary | ICD-10-CM | POA: Diagnosis not present

## 2021-07-19 MED ORDER — FLUDEOXYGLUCOSE F - 18 (FDG) INJECTION
9.7160 | Freq: Once | INTRAVENOUS | Status: AC | PRN
  Administered 2021-07-19: 9.716 via INTRAVENOUS

## 2021-07-21 NOTE — Progress Notes (Signed)
[address], [place] [address]   CLINIC:  Medical Oncology/Hematology  PCP:  [facility], [place] / [place] [address] [phone]   REASON FOR VISIT:  Follow-up for lung mass  PRIOR THERAPY: none  NGS Results: not done  CURRENT THERAPY: under work-up  BRIEF ONCOLOGIC HISTORY:  Oncology History   No history exists.    CANCER STAGING: Cancer Staging No matching staging information was found for the patient.  INTERVAL HISTORY:  Jeremy [name], a 70 y.o. male, returns for routine follow-up of his lung mass. [name] was last seen on 07/03/21.   Today he reports feeling well. He reports that this morning he had a cough productive of grey sputum as well as SOB upon exertion. Has been smoking a pipe and using nicotine gum.   REVIEW OF SYSTEMS:  Review of Systems  Constitutional:  Positive for fatigue (50%). Negative for appetite change (80%).  Respiratory:  Positive for cough and shortness of breath (w/ exertion; COPD).   Musculoskeletal:  Positive for arthralgias (7/10 hip pain).  Neurological:  Positive for numbness (tingling in hands).  All other systems reviewed and are negative.  PAST MEDICAL/SURGICAL HISTORY:  Past Medical History:  Diagnosis Date   Alcohol use    daily   Anxiety    hx (05/05/2018)   Arthritis    "all over" (05/05/2018)   Asthma    Chronic bronchitis ([place])    Dyspnea    Family history of adverse reaction to anesthesia    "mom would have nausea after OR"    GERD (gastroesophageal reflux disease)    History of hiatal hernia    Hypertension    Pneumonia X 1   PONV (postoperative nausea and vomiting)    Seasonal allergies    "spring, summer, fall"   Past Surgical History:  Procedure Laterality Date   ANTERIOR CERVICAL DECOMP/DISCECTOMY FUSION Left    BACK SURGERY     COLONOSCOPY     JOINT REPLACEMENT     LAPAROSCOPIC CHOLECYSTECTOMY     LUMBAR DISC SURGERY  X 2   L4-5    NASAL POLYP EXCISION  ~ [place] Left 05/05/2018   TOTAL HIP ARTHROPLASTY Left 05/05/2018   Procedure: LEFT TOTAL HIP ARTHROPLASTY ANTERIOR APPROACH;  Surgeon: [name], MD;  Location: [facility];  Service: Orthopedics;  Laterality: Left;    SOCIAL HISTORY:  Social History   Socioeconomic History   Marital status: Married    Spouse name: Not on file   Number of children: 2   Years of education: Not on file   Highest education level: Not on file  Occupational History   Occupation: Retired  Tobacco Use   Smoking status: Former    Packs/day: 1.00    Years: 55.00    Pack years: 55.00    Types: Cigarettes, Pipe    Quit date: 06/13/2021    Years since quitting: 0.1   Smokeless tobacco: Never  Vaping Use   Vaping Use: Never used  Substance and Sexual Activity   Alcohol use: Yes    Alcohol/week: 7.0 standard drinks    Types: 7 Shots of liquor per week   Drug use: Never   Sexual activity: Not Currently  Other Topics Concern   Not on file  Social History Narrative   Not on file   Social Determinants of Health   Financial Resource Strain: Low Risk  Difficulty of Paying Living Expenses: Not hard at all  Food Insecurity: No Food Insecurity   Worried About [place] in the Last Year: Never true   Ran Out of Food in the Last Year: Never true  Transportation Needs: No Transportation Needs   Lack of Transportation (Medical): No   Lack of Transportation (Non-Medical): No  Physical Activity: Inactive   Days of Exercise per Week: 0 days   Minutes of Exercise per Session: 0 min  Stress: No Stress Concern Present   Feeling of Stress : Not at all  Social Connections: Moderately Integrated   Frequency of Communication with Friends and Family: More than three times a week   Frequency of Social Gatherings with Friends and Family: More than three times a week   Attends Religious Services: Never   Marine scientist or  Organizations: Yes   Attends Music therapist: More than 4 times per year   Marital Status: Married  Human resources officer Violence: Not At Risk   Fear of Current or Ex-Partner: No   Emotionally Abused: No   Physically Abused: No   Sexually Abused: No    FAMILY HISTORY:  No family history on file.  CURRENT MEDICATIONS:  Current Outpatient Medications  Medication Sig Dispense Refill   amLODipine (NORVASC) 10 MG tablet Take 10 mg by mouth daily.     aspirin EC 81 MG tablet Take 1 tablet (81 mg total) by mouth 2 (two) times daily. For DVT prophylaxis (Patient taking differently: Take 81 mg by mouth daily. For DVT prophylaxis) 60 tablet 0   baclofen (LIORESAL) 10 MG tablet Take 1 tablet (10 mg total) by mouth 3 (three) times daily as needed for muscle spasms. (Patient not taking: Reported on 07/03/2021) 40 each 0   cholecalciferol (VITAMIN D) 1000 units tablet Take 1,000 Units by mouth daily.     diltiazem (CARDIZEM CD) 240 MG 24 hr capsule Take 240 mg by mouth daily.      diphenoxylate-atropine (LOMOTIL) 2.5-0.025 MG tablet Take 1 tablet by mouth 4 (four) times daily as needed for diarrhea or loose stools.     Glucosamine HCl (GLUCOSAMINE PO) Take 2 tablets by mouth daily.     levofloxacin (LEVAQUIN) 500 MG tablet Take 1 tablet (500 mg total) by mouth daily. 6 tablet 0   lisinopril (PRINIVIL,ZESTRIL) 40 MG tablet Take 40 mg by mouth daily.     meloxicam (MOBIC) 7.5 MG tablet Take 7.5 mg by mouth daily.     omeprazole (PRILOSEC) 20 MG capsule Take 20 mg by mouth daily.     TRELEGY ELLIPTA 200-62.5-25 MCG/INH AEPB Take 1 puff by mouth daily.     TURMERIC PO Take by mouth daily.     VENTOLIN HFA 108 (90 Base) MCG/ACT inhaler Inhale 1-2 puffs into the lungs every 6 (six) hours as needed for wheezing or shortness of breath.      vitamin E 180 MG (400 UNITS) capsule Take 400 Units by mouth daily.     No current facility-administered medications for this visit.    ALLERGIES:   Allergies  Allergen Reactions   Penicillins Other (See Comments)    Has patient had a PCN reaction causing immediate rash, facial/tongue/throat swelling, SOB or lightheadedness with hypotension:##Yes# but pt states he is ok with amoxicillin. Has patient had a PCN reaction causing severe rash involving mucus membranes or skin necrosis: No Has patient had a PCN reaction that required hospitalization: No Has patient had a PCN reaction  occurring within the last 10 years: No If all of the above answers are "NO", then may proceed with Cephalosporin use.     Tape Other (See Comments)    Skin tears and bruises-use paper tape    PHYSICAL EXAM:  Performance status (ECOG): 1 - Symptomatic but completely ambulatory  There were no vitals filed for this visit. Wt Readings from Last 3 Encounters:  07/03/21 185 lb 8 oz (84.1 kg)  06/27/21 183 lb 6.4 oz (83.2 kg)  06/14/21 185 lb (83.9 kg)   Physical Exam Vitals reviewed.  Constitutional:      Appearance: Normal appearance.  Cardiovascular:     Rate and Rhythm: Normal rate and regular rhythm.     Pulses: Normal pulses.     Heart sounds: Normal heart sounds.  Pulmonary:     Effort: Pulmonary effort is normal.     Breath sounds: Normal breath sounds.  Neurological:     General: No focal deficit present.     Mental Status: He is alert and oriented to person, place, and time.  Psychiatric:        Mood and Affect: Mood normal.        Behavior: Behavior normal.     LABORATORY DATA:  I have reviewed the labs as listed.  CBC Latest Ref Rng & Units 06/27/2021 06/16/2021 06/15/2021  WBC 4.0 - 10.5 K/uL 15.0(H) 9.2 8.5  Hemoglobin 13.0 - 17.0 g/dL 14.8 15.8 15.6  Hematocrit 39.0 - 52.0 % 42.9 45.4 44.1  Platelets 150 - 400 K/uL 374 388 354   CMP Latest Ref Rng & Units 06/27/2021 06/16/2021 06/15/2021  Glucose 70 - 99 mg/dL 122(H) 135(H) 120(H)  BUN 8 - 23 mg/dL 8 11 11   Creatinine 0.61 - 1.24 mg/dL 0.68 0.62 0.65  Sodium 135 - 145 mmol/L 129(L)  125(L) 127(L)  Potassium 3.5 - 5.1 mmol/L 3.1(L) 4.0 4.0  Chloride 98 - 111 mmol/L 96(L) 89(L) 92(L)  CO2 22 - 32 mmol/L 26 28 26   Calcium 8.9 - 10.3 mg/dL 8.2(L) 8.8(L) 8.9  Total Protein 6.5 - 8.1 g/dL - 6.8 6.7  Total Bilirubin 0.3 - 1.2 mg/dL - 0.5 0.6  Alkaline Phos 38 - 126 U/L - 73 81  AST 15 - 41 U/L - 41 30  ALT 0 - 44 U/L - 65(H) 43    DIAGNOSTIC IMAGING:  I have independently reviewed the scans and discussed with the patient. CT Chest Wo Contrast  Result Date: 06/27/2021 CLINICAL DATA:  Shortness of breath and fever. EXAM: CT CHEST WITHOUT CONTRAST TECHNIQUE: Multidetector CT imaging of the chest was performed following the standard protocol without IV contrast. COMPARISON:  None. FINDINGS: Cardiovascular: There is marked severity calcification of the aortic arch. Normal heart size with marked severity coronary artery calcification. No pericardial effusion. Mediastinum/Nodes: No enlarged mediastinal or axillary lymph nodes. Thyroid gland, trachea, and esophagus demonstrate no significant findings. Lungs/Pleura: Marked severity emphysematous lung disease is seen involving predominantly the bilateral upper lobes. Marked severity, patchy irregular appearing airspace disease is seen within the lateral aspect of the right upper lobe. A 1.2 cm x 1.0 cm irregular appearing left upper lobe lung nodule is noted (axial CT image 52, CT series 4). Small areas of focal scarring are seen within the posteromedial aspect of the right upper lobe (axial CT image 50, CT series 4) and posterior aspect of the left upper lobe (axial CT image 42, CT series 4). Multiple 4 mm noncalcified lung nodules are seen within the posterior aspect  of the left lower lobe (axial CT images 101, 103 and 107, CT series 4). A 2.7 cm x 1.0 cm pleural based area of low attenuation seen in the posterior aspect of the right lung base. There is no evidence of a pleural effusion or pneumothorax. Upper Abdomen: Surgical clips are seen  within the gallbladder fossa. Musculoskeletal: Degenerative changes are seen throughout the thoracic spine. IMPRESSION: 1. Right upper lobe airspace disease which may be infectious in etiology. Sequelae associated with an underlying neoplasm cannot be excluded. 2. Irregular appearing left upper lobe lung nodule, worrisome for an underlying neoplastic process. Follow-up with nuclear medicine PET/CT is recommended. 3. Pleural based mass within the posterior right lung base which also requires further evaluation to exclude an underlying neoplasm. Electronically Signed   By: Virgina Norfolk M.D.   On: 06/27/2021 20:35   NM PET Image Initial (PI) Skull Base To Thigh  Result Date: 07/20/2021 CLINICAL DATA:  Initial treatment strategy for lung nodule. EXAM: NUCLEAR MEDICINE PET SKULL BASE TO THIGH TECHNIQUE: 9.7 mCi F-18 FDG was injected intravenously. Full-ring PET imaging was performed from the skull base to thigh after the radiotracer. CT data was obtained and used for attenuation correction and anatomic localization. Fasting blood glucose: 105 mg/dl COMPARISON:  CT chest 06/27/2021 FINDINGS: Mediastinal blood pool activity: SUV max 2.3 Liver activity: SUV max 3.1 NECK: No significant abnormal hypermetabolic activity in this region. Incidental CT findings: Periventricular white matter hypodensity intracranially suspicious for mild chronic ischemic microvascular white matter disease. Bilateral common carotid atherosclerotic calcification. CHEST: There is a mostly similar configuration of the 6.7 by 4.1 cm peripheral right upper lobe airspace opacity compared to the examination from 06/27/2021. This has a maximum SUV of 6.0 which is more concentrated along the medial and upper margin of the lesion. The lower part of the opacity has a maximum SUV more round 3.6 and is fairly homogeneous. The previous interstitial accentuation below the level of this lesion is improved and the lesion has less inferior extent compared  to previous. The tiny subpleural nodules in the left lower lobe a for example the 2 mm nodule on image 161 of series 7 are not appreciably hypermetabolic but are below sensitive PET-CT size thresholds. A small anterior subpleural nodule along the left upper lobe measures about 3 by 4 mm on image 57 of series 7, similar to prior, and is not hypermetabolic although is below sensitive PET-CT size thresholds. The peripheral left upper lobe nodule has a somewhat more bandlike configuration on today's examination, measuring up to about 0.8 cm in thickness on image 77 of series 7, previously 1.2 cm in thickness. This has a maximum SUV of 2.5. The triangular subpleural nodularity in the left upper lobe adjacent to the major fissure measures about 1.4 by 1.3 cm on image 54 series 7, maximum SUV 1.8. Prior nodularity medially in the right lung apex measures about 1.2 by 0.8 cm on image 55 series 7 (previously 1.6 by 0.9 cm) and has a less solid and more ground-glass appearance on today's examination, without appreciable hypermetabolic activity. The focal pleural nodularity previously seen at the right lung base has resolved, and there is no abnormal accentuated metabolic activity in this vicinity. 0.7 cm in short axis right paratracheal lymph node on image 86 series 7 the maximum SUV 2.4, similar to blood pool. A small right hilar lymph node has maximum SUV of 3.0. Incidental CT findings: Centrilobular emphysema. Coronary, aortic arch, and branch vessel atherosclerotic vascular disease. ABDOMEN/PELVIS: Accentuated  activity in the gluteus medius and minimus muscles bilaterally, without underlying CT abnormality, felt to be physiologic. Physiologic activity in bowel. Localized accentuated activity in the vicinity of the ileocecal valve is probably physiologic given the lack of an obvious lesion in this vicinity on the CT data, with focal accentuated metabolic activity with maximum SUV 8.6. Incidental CT findings: Aortoiliac  atherosclerotic vascular disease. Cholecystectomy. Suspected small left renal cysts. SKELETON: No significant abnormal hypermetabolic activity in this region. Incidental CT findings: Left total hip prosthesis. There is evidence of right hip avascular necrosis potentially with contour flattening along the femoral head and secondary right hip osteoarthritis. Spondylosis noted with lower cervical plate and screw fixator. Lower lumbar degenerative disc disease. IMPRESSION: 1. Improved aeration and reduced interstitial accentuation along the inferior margin of the peripheral right upper lobe opacity which otherwise has a similar contour in appearance to the 06/27/2021 exam, and with maximum SUV along the upper medial portion of this process at 6.0, although the more peripheral and lower portion has an SUV of about 3.6. Possibilities may include atypical infectious process or malignancy with postobstructive pneumonia/pneumonitis with partial clearing. If biopsy of this lesion is to be performed, I would recommend targeting the more medial/upper portion of the lesion given the higher SUV in this region. 2. Mild reduction in thickness and increased bandlike appearance of 1 of the left upper lobe nodule shown previously, currently shown on image 77 of series 7, maximum SUV of 2.5. This is at the borderline of suspicion for malignancy but the morphologic change is mildly restoring that this could be an inflammatory lesion. 3. The triangular subpleural nodule in the left upper lobe adjacent to the major fissure has a maximum SUV of 1.8, favoring an inflammatory process. Similarly, the prior nodularity medially in the right lung apex is not appreciably hypermetabolic. 4. The focal pleural nodularity previously seen at the right lung base has resolved, with no abnormal metabolic activity in this vicinity. 5. Overall a substantial portion of the previous disease may well be inflammatory or postinflammatory, with the peripheral  right upper lobe airspace opacity being the primary region of residual concern. Tissue diagnosis or close imaging for surveillance is suggested. 6. Other imaging findings of potential clinical significance: Aortic Atherosclerosis (ICD10-I70.0) and Emphysema (ICD10-J43.9). Coronary atherosclerosis. Right hip AVN with suspected contour flattening of the femoral head and secondary osteoarthritis. Electronically Signed   By: Van Clines M.D.   On: 07/20/2021 12:02   DG Chest Port 1 View  Result Date: 06/27/2021 CLINICAL DATA:  Short of breath.  Recent pneumonia EXAM: PORTABLE CHEST 1 VIEW COMPARISON:  06/14/2021 FINDINGS: Underlying COPD with hyperinflation lungs and prominent lung markings. Progression of pleural based irregular thickening in the right upper lobe laterally. Adjacent parenchymal airspace disease right upper lobe is unchanged. 1 cm nodular density left upper lobe. No pneumothorax. Possible small right pleural effusion IMPRESSION: COPD Progressive pleural based density right upper lobe. Patchy right upper lobe airspace disease unchanged. Possible 1 cm left upper lobe nodule. Consider chest CT to rule out right upper lobe neoplasm versus underlying pneumonia. Also attention left upper lobe nodule. Electronically Signed   By: Franchot Gallo M.D.   On: 06/27/2021 15:52     ASSESSMENT:  1.  Left lung nodule and a possible right lung mass: -He was hospitalized from 06/14/2021 through 06/16/2021 with pneumonia and was treated with Omnicef. - He presented to the ER on 06/27/2021 with fever. - CT chest without contrast on 06/27/2021 showed patchy irregular-appearing  airspace disease seen within the lateral aspect of the right upper lobe.  A 1.2 x 1 cm irregular left upper lobe nodule noted.  Several areas of focal scarring within the posteromedial aspect of the right upper lobe and posterior aspect of the left upper lobe.  Multiple 4 mm noncalcified lung nodule seen within the posterior aspect of the  left lower lobe.  2.7 x 1.0 cm pleural-based area of low-attenuation seen in the posterior aspect of the right lung base. - He was sent home on 7 days of Levaquin.  He has 1 more day left. - Reports improvement in the cough and expectoration. - Denies any weight loss.  No fever since last week.  No chest pains reported.  2.  Social/family history: - Lives with wife at home. - He quit smoking on 06/13/2021.  Prior to that he smoked 1 pack/day for 62 years. - Has exposure to asbestos on and off for 5 to 6 years. 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 - Father had lung cancer.   PLAN:  1.  Bilateral lung masses: - We have reviewed images of the PET scan from 07/19/2021. - There is improved aeration along the inferior margin of the peripheral right upper lobe opacity.  Upper medial portion has SUV of 6.0.  More peripheral area has SUV 3.6.  Mild reduction in thickness in the left upper lobe nodule shown previously.  Subpleural left upper lobe nodule also favors inflammatory process. - Based on these findings, I believe he has infectious/inflammatory process which is resolving. - He still has gray sputum but denies any hemoptysis. - I will give him 10 more days of Levaquin. - He has a consultation with Dr. Melvyn Novas on 08/20/2021.  I have recommended a follow-up CT scan of the chest with contrast in 3 months to see if these processes are resolving.  I would be okay if Dr. Melvyn Novas recommends bronchoscopy and biopsy of the upper medial portion of the right upper lobe lesion which has maximum SUV of 6. - He was counseled to quit smoking pipe and use Nicorette.  2.  COPD: - He reports that his breathing is slightly improved since he stopped smoking. - Continue Trelegy daily and albuterol 3 times daily. - He has an appointment to see Dr. Melvyn Novas in 3 weeks.   Orders placed this encounter:  No orders of the defined types were placed in this  encounter.    Derek Jack, MD [place] [phone]   I, Thana Ates, am acting as a scribe for Dr. Derek Jack.  I, Derek Jack MD, have reviewed the above documentation for accuracy and completeness, and I agree with the above.

## 2021-07-23 ENCOUNTER — Other Ambulatory Visit (HOSPITAL_COMMUNITY)
Admission: RE | Admit: 2021-07-23 | Discharge: 2021-07-23 | Disposition: A | Discharge status: Home / Self Care | Payer: Medicare HMO | Source: Ambulatory Visit | Attending: Hematology | Admitting: Hematology

## 2021-07-23 ENCOUNTER — Encounter (HOSPITAL_COMMUNITY): Payer: Self-pay

## 2021-07-23 ENCOUNTER — Other Ambulatory Visit: Payer: Self-pay

## 2021-07-23 ENCOUNTER — Inpatient Hospital Stay (HOSPITAL_BASED_OUTPATIENT_CLINIC_OR_DEPARTMENT_OTHER): Payer: Medicare HMO | Admitting: Hematology

## 2021-07-23 VITALS — BP 157/73 | HR 80 | Temp 98.4°F | Resp 17 | Wt 188.4 lb

## 2021-07-23 DIAGNOSIS — C3490 Malignant neoplasm of unspecified part of unspecified bronchus or lung: Secondary | ICD-10-CM | POA: Diagnosis not present

## 2021-07-23 DIAGNOSIS — R911 Solitary pulmonary nodule: Secondary | ICD-10-CM

## 2021-07-23 DIAGNOSIS — R918 Other nonspecific abnormal finding of lung field: Secondary | ICD-10-CM | POA: Diagnosis not present

## 2021-07-23 MED ORDER — LEVOFLOXACIN 500 MG PO TABS: 500.0000 mg | ORAL_TABLET | Freq: Every day | ORAL | 0 refills | Status: DC

## 2021-07-23 NOTE — Progress Notes (Signed)
Call received from patient asking if it is okay to use the CPAP with humidification as prescribed by PCP. Dr. Delton Coombes advises that patient proceed CPAP use. I called the patient back and made him aware. Patient verbalizes understanding and is agreeable.

## 2021-07-23 NOTE — Patient Instructions (Addendum)
[place] at [facility] Discharge Instructions  You were seen today by Dr. Delton Coombes. He went over your recent results and scans. You will be scheduled for a CT scan of your chest prior to your next visit. Dr. Delton Coombes will see you back in 3 months for follow up.   Thank you for choosing [place] at [facility] to provide your oncology and hematology care.  To afford each patient quality time with our provider, please arrive at least 15 minutes before your scheduled appointment time.   If you have a lab appointment with the [facility] please come in thru the Main Entrance and check in at the main information desk  You need to re-schedule your appointment should you arrive 10 or more minutes late.  We strive to give you quality time with our providers, and arriving late affects you and other patients whose appointments are after yours.  Also, if you no show three or more times for appointments you may be dismissed from the clinic at the providers discretion.     Again, thank you for choosing [facility].  Our hope is that these requests will decrease the amount of time that you wait before being seen by our physicians.       _____________________________________________________________  Should you have questions after your visit to [facility], please contact our office at [phone] between the hours of 8:00 a.m. and 4:30 p.m.  Voicemails left after 4:00 p.m. will not be returned until the following business day.  For prescription refill requests, have your pharmacy contact our office and allow 72 hours.    Cancer Center Support Programs:   > Cancer Support Group  2nd Tuesday of the month 1pm-2pm, Journey Room

## 2021-07-26 ENCOUNTER — Encounter (HOSPITAL_COMMUNITY): Payer: Medicare HMO

## 2021-08-20 ENCOUNTER — Other Ambulatory Visit: Payer: Self-pay

## 2021-08-20 ENCOUNTER — Encounter: Payer: Self-pay | Admitting: Internal Medicine

## 2021-08-20 ENCOUNTER — Ambulatory Visit: Payer: Medicare HMO | Admitting: Internal Medicine

## 2021-08-20 VITALS — BP 132/78 | HR 68 | Temp 98.3°F | Ht 68.0 in | Wt 192.0 lb

## 2021-08-20 DIAGNOSIS — Z23 Encounter for immunization: Secondary | ICD-10-CM

## 2021-08-20 DIAGNOSIS — J189 Pneumonia, unspecified organism: Secondary | ICD-10-CM | POA: Diagnosis not present

## 2021-08-20 DIAGNOSIS — J449 Chronic obstructive pulmonary disease, unspecified: Secondary | ICD-10-CM | POA: Insufficient documentation

## 2021-08-20 DIAGNOSIS — I10 Essential (primary) hypertension: Secondary | ICD-10-CM

## 2021-08-20 MED ORDER — OLMESARTAN MEDOXOMIL 40 MG PO TABS: 40.0000 mg | ORAL_TABLET | Freq: Every day | ORAL | 11 refills | Status: DC

## 2021-08-20 NOTE — Assessment & Plan Note (Signed)
Acute onset of symptoms 06/12/21 with PET 07/19/21 c/w resolving inflammatory changes R apex - CT f/u scheduled for 10/22/21 >>>  Serial cxr's and ct 's are c/w CAP in pt with underly copd so rec f/u as already scheduled for 10/22/21 unless new symptoms   Discussed in detail all the  indications, usual  risks and alternatives  relative to the benefits with patient who agrees to proceed with conservative f/u as outlined

## 2021-08-20 NOTE — Assessment & Plan Note (Addendum)
Quit smoking cigs 06/2021  - 08/20/2021  After extensive coaching inhaler device,  effectiveness =  hfa   75% (short ti) > continue dpi trelegy with approp saba prn    Group D in terms of symptom/risk and laba/lama/ICS  therefore appropriate rx at this point >>>  trelegy and approl saba  Re saba: I spent extra time with pt today reviewing appropriate use of albuterol for prn use on exertion with the following points: 1) saba is for relief of sob that does not improve by walking a slower pace or resting but rather if the pt does not improve after trying this first. 2) If the pt is convinced, as many are, that saba helps recover from activity faster then it's easy to tell if this is the case by re-challenging : ie stop, take the inhaler, then p 5 minutes try the exact same activity (intensity of workload) that just caused the symptoms and see if they are substantially diminished or not after saba 3) if there is an activity that reproducibly causes the symptoms, try the saba 15 min before the activity on alternate days   If in fact the saba really does help, then fine to continue to use it prn but advised may need to look closer at the maintenance regimen being used to achieve better control of airways disease with exertion.      Also advised d/c all smoking x for ecigs and then only as a short one way bridge off all tobacco products if can't get there with the gum alone.    >>> pfts and f/u prn if satisfied with maint trelegy

## 2021-08-20 NOTE — Assessment & Plan Note (Signed)
D/c acei 08/20/2021 due to cough/ copd overlap  In the best review of chronic cough to date ( NEJM 2016 375 7014-1030) ,  ACEi are now felt to cause cough in up to  20% of pts which is a 4 fold increase from previous reports and does not include the variety of non-specific complaints we see in pulmonary clinic in pts on ACEi but previously attributed to another dx like  Copd/asthma and  include PNDS, throat discomfort (globus)  and chest congestion, "bronchitis", unexplained dyspnea and noct "strangling" sensations, and hoarseness, but also  atypical /refractory GERD symptoms like dysphagia and "bad heartburn"   The only way I know  to prove this is not an "ACEi Case" is a trial off ACEi x a minimum of 6 weeks then regroup.   >>> try change to olmesartan 40 mg daily and f/u pcp          Each maintenance medication was reviewed in detail including emphasizing most importantly the difference between maintenance and prns and under what circumstances the prns are to be triggered using an action plan format where appropriate.  Total time for H and P, chart review, counseling, reviewing hfa/dpi device(s) and generating customized AVS unique to this office visit / same day charting = 49 min

## 2021-08-20 NOTE — Progress Notes (Signed)
Jeremy [name], male    DOB: 1951-07-22,  MRN: [id number]   Brief patient profile:  54 yowm still smoking pipe   referred to pulmonary clinic in [place]  08/20/2021 by Dr  Hassell Halim)   s/p admit for CAP with residual dz ? All post inflammatory    Admit date: 06/14/2021  Discharge date: 06/16/21     Brief/Interim Summary: Jeremy [name] is a 70 y.o. male with medical history significant of COPD, essential hypertension, tobacco use disorder, alcohol use disorder, GERD, history of lumbar surgery, history of cervical spinal surgery, primary osteoarthritis of hip s/p left total hip arthroplasty presents with shortness of breath with associated fever, productive sputum of 2 day duration found to have interstitial pneumonia on CXR. Given patient's older age, history of COPD, hypoxia, fever, and hyponatremia, PSI/PORT score (107) indicated moderate mortality risk patient was hospitalized for inpatient treatment of pneumonia. Patient was de satting at 87% on room air and improved to 94% on 2L [place]. Patient received prednisone 40mg , antibiotics Rocephin and azithromycin IV. Blood culture was obtained showing...    Overnight, patient improved significantly with absence of fever for >15 hours. Patient tried exertion with and without oxygen to determine oxygen demand... Tobacco cessation counseling was provided at bedside.      Discharge Diagnoses:  Principal Problem:   Acute hypoxemic respiratory failure (HCC) Active Problems:   Community acquired pneumonia   Acute on chronic respiratory failure secondary to COPD exacerbation and community acquired pneumonia    History of Present Illness  08/20/2021  Pulmonary/ 1st office eval/ Jeremy [name] / [place] on ACEi  Chief Complaint  Patient presents with   Consult    Pneumonia in the hospital 2X. Er doctor noted some places on the lung after Chest CT. PCP noted a spot on upper right lung. Here for recommendation from hospital visit. Smokes a  tobacco pipe 3X day  Dyspnea:  limited R hip / can do room to room at home maybe 50 ft Cough: some cough/ congestion but not productive never bloody Sleep: 10 degrees with bricks / bed one pillow, has cpap but not using  SABA use: 3-4 x per day despite trelegy  Overt HB despite bed blocks and ppi bid   No obvious day to day or daytime variability or assoc excess/ purulent sputum or mucus plugs or hemoptysis or cp or chest tightness, subjective wheeze or overt sinus symptoms.     Also denies any obvious fluctuation of symptoms with weather or environmental changes or other aggravating or alleviating factors except as outlined above   No unusual exposure hx or h/o childhood pna/ asthma or knowledge of premature birth.  Current Allergies, Complete Past Medical History, Past Surgical History, Family History, and Social History were reviewed in [employer] record.  ROS  The following are not active complaints unless bolded Hoarseness, sore throat, dysphagia, dental problems, itching, sneezing,  nasal congestion or discharge of excess mucus or purulent secretions, ear ache,   fever, chills, sweats, unintended wt loss or wt gain, classically pleuritic or exertional cp,  orthopnea pnd or arm/hand swelling  or leg swelling, presyncope, palpitations, abdominal pain, anorexia, nausea, vomiting, diarrhea  or change in bowel habits or change in bladder habits, change in stools or change in urine, dysuria, hematuria,  rash, arthralgias, visual complaints, headache, numbness, weakness or ataxia or problems with walking or coordination,  change in mood or  memory. Very hard of hearing  Past Medical History:  Diagnosis Date   Alcohol use    daily   Anxiety    hx (05/05/2018)   Arthritis    "all over" (05/05/2018)   Asthma    Chronic bronchitis ([place])    Dyspnea    Family history of adverse reaction to anesthesia    "mom would have nausea after OR"    GERD (gastroesophageal  reflux disease)    History of hiatal hernia    Hypertension    Pneumonia X 1   PONV (postoperative nausea and vomiting)    Seasonal allergies    "spring, summer, fall"    Outpatient Medications Prior to Visit  Medication Sig Dispense Refill   amLODipine (NORVASC) 10 MG tablet Take 10 mg by mouth daily.     aspirin EC 81 MG tablet Take 1 tablet (81 mg total) by mouth 2 (two) times daily. For DVT prophylaxis (Patient taking differently: Take 81 mg by mouth daily. For DVT prophylaxis) 60 tablet 0   cholecalciferol (VITAMIN D) 1000 units tablet Take 1,000 Units by mouth daily.     diltiazem (CARDIZEM CD) 240 MG 24 hr capsule Take 240 mg by mouth daily.      diphenoxylate-atropine (LOMOTIL) 2.5-0.025 MG tablet Take 1 tablet by mouth 4 (four) times daily as needed for diarrhea or loose stools.     Glucosamine HCl (GLUCOSAMINE PO) Take 2 tablets by mouth daily.     lisinopril (PRINIVIL,ZESTRIL) 40 MG tablet Take 40 mg by mouth daily.     meloxicam (MOBIC) 7.5 MG tablet Take 7.5 mg by mouth daily.     pantoprazole (PROTONIX) 40 MG tablet Take 40 mg by mouth 2 (two) times daily.     TRELEGY ELLIPTA 200-62.5-25 MCG/INH AEPB Take 1 puff by mouth daily.     TURMERIC PO Take by mouth daily.     VENTOLIN HFA 108 (90 Base) MCG/ACT inhaler Inhale 1-2 puffs into the lungs every 6 (six) hours as needed for wheezing or shortness of breath.     vitamin E 180 MG (400 UNITS) capsule Take 400 Units by mouth daily.     baclofen (LIORESAL) 10 MG tablet Take 1 tablet (10 mg total) by mouth 3 (three) times daily as needed for muscle spasms. (Patient not taking: No sig reported) 40 each 0                    Objective:     BP 132/78 (BP Location: Left Arm, Patient Position: Sitting)   Pulse 68   Temp 98.3 F (36.8 C) (Temporal)   Ht 5\' 8"  (1.727 m)   Wt 192 lb (87.1 kg)   SpO2 97% Comment: RA  BMI 29.19 kg/m   SpO2: 97 % (RA)  Amb slt tremulous  wm nad at rest   HEENT : pt wearing mask not  removed for exam due to covid - 19 concerns.    NECK :  without JVD/Nodes/TM/ nl carotid upstrokes bilaterally   LUNGS: no acc muscle use,  Mild barrel  contour chest wall with bilateral  Distant bs s audible wheeze and  without cough on insp or exp maneuvers  and mild  Hyperresonant  to  percussion bilaterally     CV:  RRR  no s3 or murmur or increase in P2, and no edema   ABD:  soft and nontender with pos end  insp Hoover's  in the supine position. No bruits or organomegaly appreciated, bowel sounds nl  MS:  Nl gait/  ext warm without deformities, calf tenderness, cyanosis or clubbing No obvious joint restrictions   SKIN: warm and dry without lesions    NEURO:  alert, approp, nl sensorium with  no motor or cerebellar deficits apparent.        I personally reviewed images and agree with radiology impression as follows:   Chest CT PET  07/19/21 There is a mostly similar configuration of the 6.7 by 4.1 cm peripheral right upper lobe airspace opacity compared to the examination from 06/27/2021. This has a maximum SUV of 6.0 which is more concentrated along the medial and upper margin of the lesion. The lower part of the opacity has a maximum SUV more round 3.6 and is fairly homogeneous. The previous interstitial accentuation below the level of this lesion is improved and the lesion has less inferior extent compared to previous.    Assessment   Community acquired pneumonia Acute onset of symptoms 06/12/21 with PET 07/19/21 c/w resolving inflammatory changes R apex - CT f/u scheduled for 10/22/21 >>>  Serial cxr's and ct 's are c/w CAP in pt with underly copd so rec f/u as already scheduled for 10/22/21 unless new symptoms   Discussed in detail all the  indications, usual  risks and alternatives  relative to the benefits with patient who agrees to proceed with conservative f/u as outlined      COPD GOLD ?  Quit smoking cigs 06/2021  - 08/20/2021  After extensive coaching  inhaler device,  effectiveness =  hfa   75% (short ti) > continue dpi trelegy with approp saba prn    Group D in terms of symptom/risk and laba/lama/ICS  therefore appropriate rx at this point >>>  trelegy and approl saba  Re saba: I spent extra time with pt today reviewing appropriate use of albuterol for prn use on exertion with the following points: 1) saba is for relief of sob that does not improve by walking a slower pace or resting but rather if the pt does not improve after trying this first. 2) If the pt is convinced, as many are, that saba helps recover from activity faster then it's easy to tell if this is the case by re-challenging : ie stop, take the inhaler, then p 5 minutes try the exact same activity (intensity of workload) that just caused the symptoms and see if they are substantially diminished or not after saba 3) if there is an activity that reproducibly causes the symptoms, try the saba 15 min before the activity on alternate days   If in fact the saba really does help, then fine to continue to use it prn but advised may need to look closer at the maintenance regimen being used to achieve better control of airways disease with exertion.      Also advised d/c all smoking x for ecigs and then only as a short one way bridge off all tobacco products if can't get there with the gum alone.   >>> pfts and f/u prn if satisfied with maint trelegy     Essential hypertension D/c acei 08/20/2021 due to cough/ copd overlap  In the best review of chronic cough to date ( NEJM 2016 375 6948-5462) ,  ACEi are now felt to cause cough in up to  20% of pts which is a 4 fold increase from previous reports and does not include the variety of non-specific complaints we see in pulmonary clinic in pts on ACEi but previously attributed to another  dx like  Copd/asthma and  include PNDS, throat discomfort (globus)  and chest congestion, "bronchitis", unexplained dyspnea and noct "strangling"  sensations, and hoarseness, but also  atypical /refractory GERD symptoms like dysphagia and "bad heartburn"   The only way I know  to prove this is not an "ACEi Case" is a trial off ACEi x a minimum of 6 weeks then regroup.   >>> try change to olmesartan 40 mg daily and f/u pcp      Each maintenance medication was reviewed in detail including emphasizing most importantly the difference between maintenance and prns and under what circumstances the prns are to be triggered using an action plan format where appropriate.  Total time for H and P, chart review, counseling, reviewing hfa/dpi device(s) and generating customized AVS unique to this office visit / same day charting = 49 min        Christinia Gully, MD 08/20/2021

## 2021-08-20 NOTE — Patient Instructions (Addendum)
Plan A = Automatic = Always=    Trelegy one click daily   Plan B = Backup (to supplement plan A, not to replace it) Only use your albuterol inhaler as a rescue medication to be used if you can't catch your breath by resting or doing a relaxed purse lip breathing pattern.  - The less you use it, the better it will work when you need it. - Ok to use the inhaler up to 2 puffs  every 4 hours if you must but call for appointment if use goes up over your usual need - Don't leave home without it !!  (think of it like the spare tire for your car)   Ok to try albuterol 15 min before an activity (on alternating days)  that you know would usually make you short of breath and see if it makes any difference and if makes none then don't take albuterol after activity unless you can't catch your breath as this means it's the resting that helps, not the albuterol.      Stop lisinopril and substitute olmesartan 40 mg one daily and many of your symptoms should improve.  PFTs should be done next available and I will call you with results   Suggested e-cigs as an optional  "one way bridge"  Off all tobacco products   Pulmonary follow up after your CT if indicated

## 2021-09-14 ENCOUNTER — Encounter (HOSPITAL_COMMUNITY): Payer: Self-pay

## 2021-09-18 ENCOUNTER — Telehealth: Payer: Self-pay | Admitting: Internal Medicine

## 2021-09-18 NOTE — Telephone Encounter (Signed)
Any Tuesday is fine in pm

## 2021-09-18 NOTE — Telephone Encounter (Signed)
Pt is trying to have hip surgery scheduled with Dr. Percell Miller.  Wondering if he can make an appt with Dr. Percell Miller to start planning surgery or wait until after his CT scheduled on 11/21  Dr. Melvyn Novas please advise

## 2021-09-18 NOTE — Telephone Encounter (Signed)
Mw do you want me to double book you?  You have no openings in [facility] until 12/2

## 2021-09-18 NOTE — Telephone Encounter (Signed)
Spoke with the pt  He prefers to wait until he has his ct first and then see [place]  Ov with MW in [place] 11/02/21

## 2021-09-18 NOTE — Telephone Encounter (Signed)
Move up the f/u to next available and I'll see if I can clear him for surgery then

## 2021-10-19 ENCOUNTER — Other Ambulatory Visit (HOSPITAL_COMMUNITY): Payer: Self-pay

## 2021-10-19 DIAGNOSIS — R911 Solitary pulmonary nodule: Secondary | ICD-10-CM

## 2021-10-22 ENCOUNTER — Other Ambulatory Visit: Payer: Self-pay

## 2021-10-22 ENCOUNTER — Ambulatory Visit (HOSPITAL_COMMUNITY)
Admission: RE | Admit: 2021-10-22 | Discharge: 2021-10-22 | Disposition: A | Discharge status: Home / Self Care | Payer: Medicare HMO | Source: Ambulatory Visit | Attending: Hematology | Admitting: Hematology

## 2021-10-22 ENCOUNTER — Inpatient Hospital Stay (HOSPITAL_COMMUNITY): Payer: Medicare HMO | Attending: Hematology

## 2021-10-22 DIAGNOSIS — R911 Solitary pulmonary nodule: Secondary | ICD-10-CM | POA: Diagnosis not present

## 2021-10-22 DIAGNOSIS — C3490 Malignant neoplasm of unspecified part of unspecified bronchus or lung: Secondary | ICD-10-CM | POA: Diagnosis present

## 2021-10-22 LAB — CBC WITH DIFFERENTIAL/PLATELET
Abs Immature Granulocytes: 0.08 10*3/uL — ABNORMAL HIGH (ref 0.00–0.07)
Basophils Absolute: 0.1 10*3/uL (ref 0.0–0.1)
Basophils Relative: 1 %
Eosinophils Absolute: 0.3 10*3/uL (ref 0.0–0.5)
Eosinophils Relative: 3 %
HCT: 46.7 % (ref 39.0–52.0)
Hemoglobin: 16.5 g/dL (ref 13.0–17.0)
Immature Granulocytes: 1 %
Lymphocytes Relative: 13 %
Lymphs Abs: 1.3 10*3/uL (ref 0.7–4.0)
MCH: 35.5 pg — ABNORMAL HIGH (ref 26.0–34.0)
MCHC: 35.3 g/dL (ref 30.0–36.0)
MCV: 100.4 fL — ABNORMAL HIGH (ref 80.0–100.0)
Monocytes Absolute: 1 10*3/uL (ref 0.1–1.0)
Monocytes Relative: 10 %
Neutro Abs: 7.4 10*3/uL (ref 1.7–7.7)
Neutrophils Relative %: 72 %
Platelets: 268 10*3/uL (ref 150–400)
RBC: 4.65 MIL/uL (ref 4.22–5.81)
RDW: 12.3 % (ref 11.5–15.5)
WBC: 10.1 10*3/uL (ref 4.0–10.5)
nRBC: 0 % (ref 0.0–0.2)

## 2021-10-22 LAB — COMPREHENSIVE METABOLIC PANEL
ALT: 26 U/L (ref 0–44)
AST: 20 U/L (ref 15–41)
Albumin: 4.2 g/dL (ref 3.5–5.0)
Alkaline Phosphatase: 74 U/L (ref 38–126)
Anion gap: 8 (ref 5–15)
BUN: 7 mg/dL — ABNORMAL LOW (ref 8–23)
CO2: 26 mmol/L (ref 22–32)
Calcium: 8.9 mg/dL (ref 8.9–10.3)
Chloride: 99 mmol/L (ref 98–111)
Creatinine, Ser: 0.71 mg/dL (ref 0.61–1.24)
GFR, Estimated: >60 mL/min (ref >60)
Glucose, Bld: 111 mg/dL — ABNORMAL HIGH (ref 70–99)
Potassium: 4.2 mmol/L (ref 3.5–5.1)
Sodium: 133 mmol/L — ABNORMAL LOW (ref 135–145)
Total Bilirubin: 0.7 mg/dL (ref 0.3–1.2)
Total Protein: 7.1 g/dL (ref 6.5–8.1)

## 2021-10-22 MED ORDER — IOHEXOL 300 MG/ML  SOLN
80.0000 mL | Freq: Once | INTRAMUSCULAR | Status: AC | PRN
  Administered 2021-10-22: 80 mL via INTRAVENOUS

## 2021-10-28 NOTE — Progress Notes (Signed)
[place], [place] [address]   CLINIC:  Medical Oncology/Hematology  PCP:  [facility], [place] / [place] [address] [phone]   REASON FOR VISIT:  Follow-up for lung mass  PRIOR THERAPY: none  NGS Results: not done  CURRENT THERAPY: surveillance  BRIEF ONCOLOGIC HISTORY:  Oncology History   No history exists.    CANCER STAGING: Cancer Staging  No matching staging information was found for the patient.  INTERVAL HISTORY:  [name], a 70 y.o. male, returns for routine follow-up of his lung mass. Jeremy [name] was last seen on 07/23/2021.   Today he reports feeling well. He reports SOB, and difficulty walking due to pain in his right hip. He reports improved but persistent cough productive of clear, gray sputum.   REVIEW OF SYSTEMS:  Review of Systems  Constitutional:  Positive for fatigue (40%). Negative for appetite change (80%).  Respiratory:  Positive for cough and shortness of breath.   Gastrointestinal:  Positive for diarrhea.  Musculoskeletal:  Positive for arthralgias (4/10 R hip).  Neurological:  Positive for numbness.  Psychiatric/Behavioral:  Positive for sleep disturbance (d/t hip pain).   All other systems reviewed and are negative.  PAST MEDICAL/SURGICAL HISTORY:  Past Medical History:  Diagnosis Date   Alcohol use    daily   Anxiety    hx (05/05/2018)   Arthritis    "all over" (05/05/2018)   Asthma    Chronic bronchitis ([place])    Dyspnea    Family history of adverse reaction to anesthesia    "mom would have nausea after OR"    GERD (gastroesophageal reflux disease)    History of hiatal hernia    Hypertension    Pneumonia X 1   PONV (postoperative nausea and vomiting)    Seasonal allergies    "spring, summer, fall"   Past Surgical History:  Procedure Laterality Date   ANTERIOR CERVICAL DECOMP/DISCECTOMY FUSION Left    BACK SURGERY     COLONOSCOPY     JOINT REPLACEMENT      LAPAROSCOPIC CHOLECYSTECTOMY     LUMBAR DISC SURGERY  X 2   L4-5   NASAL POLYP EXCISION  ~ [place] Left 05/05/2018   TOTAL HIP ARTHROPLASTY Left 05/05/2018   Procedure: LEFT TOTAL HIP ARTHROPLASTY ANTERIOR APPROACH;  Surgeon: [name], MD;  Location: [facility];  Service: Orthopedics;  Laterality: Left;    SOCIAL HISTORY:  Social History   Socioeconomic History   Marital status: Married    Spouse name: Not on file   Number of children: 2   Years of education: Not on file   Highest education level: Not on file  Occupational History   Occupation: Retired  Tobacco Use   Smoking status: Former    Packs/day: 1.00    Years: 55.00    Pack years: 55.00    Types: Cigarettes, Pipe    Quit date: 06/13/2021    Years since quitting: 0.3   Smokeless tobacco: Never  Vaping Use   Vaping Use: Never used  Substance and Sexual Activity   Alcohol use: Yes    Alcohol/week: 7.0 standard drinks    Types: 7 Shots of liquor per week   Drug use: Never   Sexual activity: Not Currently  Other Topics Concern   Not on file  Social History Narrative   Not on file   Social Determinants of Health  Financial Resource Strain: Low Risk    Difficulty of Paying Living Expenses: Not hard at all  Food Insecurity: No Food Insecurity   Worried About Charity fundraiser in the Last Year: Never true   Ran Out of Food in the Last Year: Never true  Transportation Needs: No Transportation Needs   Lack of Transportation (Medical): No   Lack of Transportation (Non-Medical): No  Physical Activity: Inactive   Days of Exercise per Week: 0 days   Minutes of Exercise per Session: 0 min  Stress: No Stress Concern Present   Feeling of Stress : Not at all  Social Connections: Moderately Integrated   Frequency of Communication with Friends and Family: More than three times a week   Frequency of Social Gatherings with Friends and Family: More than three times a week    Attends Religious Services: Never   Marine scientist or Organizations: Yes   Attends Music therapist: More than 4 times per year   Marital Status: Married  Human resources officer Violence: Not At Risk   Fear of Current or Ex-Partner: No   Emotionally Abused: No   Physically Abused: No   Sexually Abused: No    FAMILY HISTORY:  No family history on file.  CURRENT MEDICATIONS:  Current Outpatient Medications  Medication Sig Dispense Refill   amLODipine (NORVASC) 10 MG tablet Take 10 mg by mouth daily.     aspirin EC 81 MG tablet Take 1 tablet (81 mg total) by mouth 2 (two) times daily. For DVT prophylaxis (Patient taking differently: Take 81 mg by mouth daily. For DVT prophylaxis) 60 tablet 0   cholecalciferol (VITAMIN D) 1000 units tablet Take 1,000 Units by mouth daily.     diltiazem (CARDIZEM CD) 240 MG 24 hr capsule Take 240 mg by mouth daily.      diphenoxylate-atropine (LOMOTIL) 2.5-0.025 MG tablet Take 1 tablet by mouth 4 (four) times daily as needed for diarrhea or loose stools.     Glucosamine HCl (GLUCOSAMINE PO) Take 2 tablets by mouth daily.     meloxicam (MOBIC) 7.5 MG tablet Take 7.5 mg by mouth daily.     olmesartan (BENICAR) 40 MG tablet Take 1 tablet (40 mg total) by mouth daily. 30 tablet 11   pantoprazole (PROTONIX) 40 MG tablet Take 40 mg by mouth 2 (two) times daily.     TRELEGY ELLIPTA 200-62.5-25 MCG/INH AEPB Take 1 puff by mouth daily.     TURMERIC PO Take by mouth daily.     VENTOLIN HFA 108 (90 Base) MCG/ACT inhaler Inhale 1-2 puffs into the lungs every 6 (six) hours as needed for wheezing or shortness of breath.     vitamin E 180 MG (400 UNITS) capsule Take 400 Units by mouth daily.     No current facility-administered medications for this visit.    ALLERGIES:  Allergies  Allergen Reactions   Penicillins Other (See Comments)    Has patient had a PCN reaction causing immediate rash, facial/tongue/throat swelling, SOB or lightheadedness with  hypotension:##Yes# but pt states he is ok with amoxicillin. Has patient had a PCN reaction causing severe rash involving mucus membranes or skin necrosis: No Has patient had a PCN reaction that required hospitalization: No Has patient had a PCN reaction occurring within the last 10 years: No If all of the above answers are "NO", then may proceed with Cephalosporin use.     Tape Other (See Comments)    Skin tears and bruises-use paper tape  PHYSICAL EXAM:  Performance status (ECOG): 1 - Symptomatic but completely ambulatory  There were no vitals filed for this visit. Wt Readings from Last 3 Encounters:  08/20/21 192 lb (87.1 kg)  07/23/21 188 lb 6.4 oz (85.5 kg)  07/03/21 185 lb 8 oz (84.1 kg)   Physical Exam Vitals reviewed.  Constitutional:      Appearance: Normal appearance.  Cardiovascular:     Rate and Rhythm: Normal rate and regular rhythm.     Pulses: Normal pulses.     Heart sounds: Normal heart sounds.  Pulmonary:     Effort: Pulmonary effort is normal.     Breath sounds: Normal breath sounds.  Neurological:     General: No focal deficit present.     Mental Status: He is alert and oriented to person, place, and time.  Psychiatric:        Mood and Affect: Mood normal.        Behavior: Behavior normal.     LABORATORY DATA:  I have reviewed the labs as listed.  CBC Latest Ref Rng & Units 10/22/2021 06/27/2021 06/16/2021  WBC 4.0 - 10.5 K/uL 10.1 15.0(H) 9.2  Hemoglobin 13.0 - 17.0 g/dL 16.5 14.8 15.8  Hematocrit 39.0 - 52.0 % 46.7 42.9 45.4  Platelets 150 - 400 K/uL 268 374 388   CMP Latest Ref Rng & Units 10/22/2021 06/27/2021 06/16/2021  Glucose 70 - 99 mg/dL 111(H) 122(H) 135(H)  BUN 8 - 23 mg/dL 7(L) 8 11  Creatinine 0.61 - 1.24 mg/dL 0.71 0.68 0.62  Sodium 135 - 145 mmol/L 133(L) 129(L) 125(L)  Potassium 3.5 - 5.1 mmol/L 4.2 3.1(L) 4.0  Chloride 98 - 111 mmol/L 99 96(L) 89(L)  CO2 22 - 32 mmol/L 26 26 28   Calcium 8.9 - 10.3 mg/dL 8.9 8.2(L) 8.8(L)   Total Protein 6.5 - 8.1 g/dL 7.1 - 6.8  Total Bilirubin 0.3 - 1.2 mg/dL 0.7 - 0.5  Alkaline Phos 38 - 126 U/L 74 - 73  AST 15 - 41 U/L 20 - 41  ALT 0 - 44 U/L 26 - 65(H)    DIAGNOSTIC IMAGING:  I have independently reviewed the scans and discussed with the patient. CT Chest W Contrast  Result Date: 10/23/2021 CLINICAL DATA:  70 year old male with history of possible lung mass. EXAM: CT CHEST WITH CONTRAST TECHNIQUE: Multidetector CT imaging of the chest was performed during intravenous contrast administration. CONTRAST:  38mL OMNIPAQUE IOHEXOL 300 MG/ML  SOLN COMPARISON:  PET-CT 07/19/2021.  Chest CT 05/28/2021. FINDINGS: Cardiovascular: Heart size is normal. There is no significant pericardial fluid, thickening or pericardial calcification. There is aortic atherosclerosis, as well as atherosclerosis of the great vessels of the mediastinum and the coronary arteries, including calcified atherosclerotic plaque in the left main, left anterior descending, left circumflex and right coronary arteries. Mediastinum/Nodes: No pathologically enlarged mediastinal or hilar lymph nodes. Esophagus is unremarkable in appearance. No axillary lymphadenopathy. Lungs/Pleura: When compared to prior examinations there continues to be regression of previously noted nodular and mass-like areas of airspace consolidation seen on the prior studies. The largest residual area on today's examination is noted in the right upper lobe (axial image 51 of series 4) measuring 2.9 x 1.2 cm. These regions have some surrounding ground-glass attenuation and septal thickening and are most compatible with areas of evolving post infectious scarring. No other new suspicious appearing pulmonary nodules or masses are noted. No new acute consolidative airspace disease. No pleural effusions. Diffuse bronchial wall thickening with moderate to severe centrilobular and  paraseptal emphysema. Upper Abdomen: Aortic atherosclerosis. Musculoskeletal: There  are no aggressive appearing lytic or blastic lesions noted in the visualized portions of the skeleton. IMPRESSION: 1. Continued evolution of areas of post infectious or inflammatory scarring in the lungs. No definitive findings to suggest neoplasm on today's examination. Repeat noncontrast chest CT is recommended in 6 months to ensure continued regression of these findings. 2. Diffuse bronchial wall thickening with moderate to severe centrilobular and paraseptal emphysema; imaging findings suggestive of underlying COPD. 3. Aortic atherosclerosis, in addition to left main and 3 vessel coronary artery disease. Assessment for potential risk factor modification, dietary therapy or pharmacologic therapy may be warranted, if clinically indicated. Aortic Atherosclerosis (ICD10-I70.0) and Emphysema (ICD10-J43.9). Electronically Signed   By: Vinnie Langton M.D.   On: 10/23/2021 08:49     ASSESSMENT:  1.  Left lung nodule and a possible right lung mass: -He was hospitalized from 06/14/2021 through 06/16/2021 with pneumonia and was treated with Omnicef. - He presented to the ER on 06/27/2021 with fever. - CT chest without contrast on 06/27/2021 showed patchy irregular-appearing airspace disease seen within the lateral aspect of the right upper lobe.  A 1.2 x 1 cm irregular left upper lobe nodule noted.  Several areas of focal scarring within the posteromedial aspect of the right upper lobe and posterior aspect of the left upper lobe.  Multiple 4 mm noncalcified lung nodule seen within the posterior aspect of the left lower lobe.  2.7 x 1.0 cm pleural-based area of low-attenuation seen in the posterior aspect of the right lung base. - He was sent home on 7 days of Levaquin.  He has 1 more day left. - Reports improvement in the cough and expectoration. - Denies any weight loss.  No fever since last week.  No chest pains reported.  2.  Social/family history: - Lives with wife at home. - He quit smoking on 06/13/2021.   Prior to that he smoked 1 pack/day for 62 years. - Has exposure to asbestos on and off for 5 to 6 years. 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 - Father had lung cancer.   PLAN:  1.  Bilateral lung masses: -Reviewed CT chest from 10/22/2021.  Continued evaluation of infectious/inflammatory scarring in the lungs. - Recommend follow-up in 6 months with repeat CT scan of the chest without contrast.  2.  COPD: -He is using Trelegy and albuterol. - He reports continued shortness of breath on exertion. - Follow-up with Dr. Melvyn Novas.   Orders placed this encounter:  No orders of the defined types were placed in this encounter.    Derek Jack, MD [place] [phone]   I, Thana Ates, am acting as a scribe for Dr. Derek Jack.  I, Derek Jack MD, have reviewed the above documentation for accuracy and completeness, and I agree with the above.

## 2021-10-29 ENCOUNTER — Inpatient Hospital Stay (HOSPITAL_COMMUNITY): Payer: Medicare HMO | Admitting: Hematology

## 2021-10-29 ENCOUNTER — Other Ambulatory Visit: Payer: Self-pay

## 2021-10-29 VITALS — BP 177/83 | HR 85 | Temp 97.4°F | Resp 20 | Wt 192.9 lb

## 2021-10-29 DIAGNOSIS — C3490 Malignant neoplasm of unspecified part of unspecified bronchus or lung: Secondary | ICD-10-CM | POA: Diagnosis not present

## 2021-10-29 DIAGNOSIS — Z8701 Personal history of pneumonia (recurrent): Secondary | ICD-10-CM | POA: Insufficient documentation

## 2021-10-29 DIAGNOSIS — R911 Solitary pulmonary nodule: Secondary | ICD-10-CM | POA: Diagnosis not present

## 2021-10-29 DIAGNOSIS — J449 Chronic obstructive pulmonary disease, unspecified: Secondary | ICD-10-CM | POA: Diagnosis present

## 2021-10-29 DIAGNOSIS — Z791 Long term (current) use of non-steroidal anti-inflammatories (NSAID): Secondary | ICD-10-CM | POA: Insufficient documentation

## 2021-10-29 NOTE — Patient Instructions (Signed)
[place] at [facility] Discharge Instructions  You were seen and examined by Dr. Delton Coombes. He reviewed your CT scan results, which is improving. Return as scheduled in 6 months for lab work, CT scan, and office visit.   Thank you for choosing [place] at [facility] to provide your oncology and hematology care.  To afford each patient quality time with our provider, please arrive at least 15 minutes before your scheduled appointment time.   If you have a lab appointment with the [facility] please come in thru the Main Entrance and check in at the main information desk.  You need to re-schedule your appointment should you arrive 10 or more minutes late.  We strive to give you quality time with our providers, and arriving late affects you and other patients whose appointments are after yours.  Also, if you no show three or more times for appointments you may be dismissed from the clinic at the providers discretion.     Again, thank you for choosing [facility].  Our hope is that these requests will decrease the amount of time that you wait before being seen by our physicians.       _____________________________________________________________  Should you have questions after your visit to [facility], please contact our office at [phone] and follow the prompts.  Our office hours are 8:00 a.m. and 4:30 p.m. Monday - Friday.  Please note that voicemails left after 4:00 p.m. may not be returned until the following business day.  We are closed weekends and major holidays.  You do have access to a nurse 24-7, just call the main number to the clinic [phone] and do not press any options, hold on the line and a nurse will answer the phone.    For prescription refill requests, have your pharmacy contact our office and allow 72 hours.    Due to Covid, you will need to wear a mask upon entering the hospital. If you  do not have a mask, a mask will be given to you at the Main Entrance upon arrival. For doctor visits, patients may have 1 support person age 57 or older with them. For treatment visits, patients can not have anyone with them due to social distancing guidelines and our immunocompromised population.

## 2021-11-02 ENCOUNTER — Ambulatory Visit: Payer: Medicare HMO | Admitting: Internal Medicine

## 2021-11-02 ENCOUNTER — Telehealth: Payer: Self-pay

## 2021-11-02 ENCOUNTER — Encounter: Payer: Self-pay | Admitting: Internal Medicine

## 2021-11-02 ENCOUNTER — Other Ambulatory Visit: Payer: Self-pay

## 2021-11-02 DIAGNOSIS — J189 Pneumonia, unspecified organism: Secondary | ICD-10-CM

## 2021-11-02 DIAGNOSIS — I10 Essential (primary) hypertension: Secondary | ICD-10-CM

## 2021-11-02 DIAGNOSIS — J449 Chronic obstructive pulmonary disease, unspecified: Secondary | ICD-10-CM | POA: Diagnosis not present

## 2021-11-02 DIAGNOSIS — J310 Chronic rhinitis: Secondary | ICD-10-CM

## 2021-11-02 MED ORDER — TRELEGY ELLIPTA 100-62.5-25 MCG/ACT IN AEPB: INHALATION_SPRAY | RESPIRATORY_TRACT | 11 refills | Status: DC

## 2021-11-02 NOTE — Telephone Encounter (Signed)
Called and spoke with Jeremy [name] from [facility]. She states pt had nan order for pft placed in august 2022 and was scheduled for 07/24/21. Appt was cancelled but Jeremy [name] is going to use the old order and schedule a pft for patient. Earliest available is the end of January 2023.

## 2021-11-02 NOTE — Patient Instructions (Addendum)
Plan A = Automatic = Always=    Trelegy 915 one click first thing in am and blow out through nose   Plan B = Backup (to supplement plan A, not to replace it) Only use your albuterol inhaler as a rescue medication to be used if you can't catch your breath by resting or doing a relaxed purse lip breathing pattern.  - The less you use it, the better it will work when you need it. - Ok to use the inhaler up to 2 puffs  every 4 hours if you must but call for appointment if use goes up over your usual need - Don't leave home without it !!  (think of it like the spare tire for your car)     Flonase  has no immediate benefit in terms of improving symptoms.  To help them reached the target tissue, the patient should use Neosyneprine one -two puffs every 12 hours applied one min before using the nasal steroids.  Neo  should be stopped after no more than 5 days.  If the symptoms worsen, Afrin can be restarted after 5 days off of therapy to prevent rebound congestion from overuse of Afrin.  I also emphasized that in no way are nasal steroids a concern in terms of "addiction".    Please schedule a follow up visit in 3 months with PFTs on return >>> also needs alpha one screen

## 2021-11-02 NOTE — Progress Notes (Signed)
Jeremy [name], male    DOB: March 26, 1951  MRN: [id number]   Brief patient profile:  34 yowm quit smoking cigarettes 06/2021 referred to pulmonary clinic in [facility]  08/20/2021 by Dr  Hassell Halim)   s/p admit for CAP with residual dz ? All post inflammatory    Admit date: 06/14/2021  Discharge date: 06/16/21     Brief/Interim Summary: Jeremy [name] is a 70 y.o. male with medical history significant of COPD, essential hypertension, tobacco use disorder, alcohol use disorder, GERD, history of lumbar surgery, history of cervical spinal surgery, primary osteoarthritis of hip s/p left total hip arthroplasty presents with shortness of breath with associated fever, productive sputum of 2 day duration found to have interstitial pneumonia on CXR. Given patient's older age, history of COPD, hypoxia, fever, and hyponatremia, PSI/PORT score (107) indicated moderate mortality risk patient was hospitalized for inpatient treatment of pneumonia. Patient was de satting at 87% on room air and improved to 94% on 2L [place]. Patient received prednisone 40mg , antibiotics Rocephin and azithromycin IV. Blood culture was obtained showing...    Overnight, patient improved significantly with absence of fever for >15 hours. Patient tried exertion with and without oxygen to determine oxygen demand... Tobacco cessation counseling was provided at bedside.      Discharge Diagnoses:  Principal Problem:   Acute hypoxemic respiratory failure (HCC) Active Problems:   Community acquired pneumonia   Acute on chronic respiratory failure secondary to COPD exacerbation and community acquired pneumonia    History of Present Illness  08/20/2021  Pulmonary/ 1st office eval/ Ahron Hulbert / [place] on ACEi  Chief Complaint  Patient presents with   Consult    Pneumonia in the hospital 2X. Er doctor noted some places on the lung after Chest CT. PCP noted a spot on upper right lung. Here for recommendation from hospital visit.  Smokes a tobacco pipe 3X day  Dyspnea:  limited R hip / can do room to room at home maybe 50 ft Cough: some cough/ congestion but not productive never bloody Sleep: 10 degrees with bricks / bed one pillow, has cpap but not using  SABA use: 3-4 x per day despite trelegy  Overt HB despite bed blocks and ppi bid  Rec Plan A = Automatic = Always=    Trelegy one click daily  Plan B = Backup (to supplement plan A, not to replace it) Only use your albuterol inhaler as a rescue medication Ok to try albuterol 15 min before an activity (on alternating days)  that you know would usually make you short of breath  Stop lisinopril and substitute olmesartan 40 mg one daily and many of your symptoms should improve. PFTs should be done next available and I will call you with results > not done Suggested e-cigs as an optional  "one way bridge"  Off all tobacco products     11/02/2021  f/u ov/[place] office/Beva Remund re: RUL pna/ copd/ ? Acei case  maint on trelegy 200 and taking it at noon   Chief Complaint  Patient presents with   Follow-up    F/u visit for surgery discussion. Feels he has improved a little since last OV.   Dyspnea:  100 ft = MMRC3 = can't walk 100 yards even at a slow pace at a flat grade s stopping due to sob   Cough: nasal congestion / using neosynephrine x 2 weeks had been using pseudofed previously  Sleeping: bed blocks / pillow x 1  SABA use: q am (  not the instructions given)    02: none  Covid status: vax x 5       No obvious day to day or daytime variability or assoc excess/ purulent sputum or mucus plugs or hemoptysis or cp or chest tightness, subjective wheeze or overt sinus or hb symptoms.   Sleeping as above without nocturnal  or early am exacerbation  of respiratory  c/o's or need for noct saba. Also denies any obvious fluctuation of symptoms with weather or environmental changes or other aggravating or alleviating factors except as outlined above   No unusual exposure hx  or h/o childhood pna/ asthma or knowledge of premature birth.  Current Allergies, Complete Past Medical History, Past Surgical History, Family History, and Social History were reviewed in [employer] record.  ROS  The following are not active complaints unless bolded Hoarseness, sore throat, dysphagia, dental problems, itching, sneezing,  nasal congestion or discharge of excess mucus or purulent secretions, ear ache,   fever, chills, sweats, unintended wt loss or wt gain, classically pleuritic or exertional cp,  orthopnea pnd or arm/hand swelling  or leg swelling, presyncope, palpitations, abdominal pain, anorexia, nausea, vomiting, diarrhea  or change in bowel habits or change in bladder habits, change in stools or change in urine, dysuria, hematuria,  rash, arthralgias, visual complaints, headache, numbness, weakness or ataxia or problems with walking or coordination,  change in mood or  memory.        Current Meds  Medication Sig   amLODipine (NORVASC) 10 MG tablet Take 10 mg by mouth daily.   aspirin EC 81 MG tablet Take 1 tablet (81 mg total) by mouth 2 (two) times daily. For DVT prophylaxis (Patient taking differently: Take 81 mg by mouth daily. For DVT prophylaxis)   cholecalciferol (VITAMIN D) 1000 units tablet Take 1,000 Units by mouth daily.   Cyanocobalamin (B-12 COMPLIANCE INJECTION IJ) Inject as directed.   diltiazem (CARDIZEM CD) 240 MG 24 hr capsule Take 240 mg by mouth daily.    diphenoxylate-atropine (LOMOTIL) 2.5-0.025 MG tablet Take 1 tablet by mouth 4 (four) times daily as needed for diarrhea or loose stools.   Glucosamine HCl (GLUCOSAMINE PO) Take 2 tablets by mouth daily.   levofloxacin (LEVAQUIN) 500 MG tablet Take 500 mg by mouth daily.   meloxicam (MOBIC) 7.5 MG tablet Take 7.5 mg by mouth daily.   olmesartan (BENICAR) 40 MG tablet Take 1 tablet (40 mg total) by mouth daily.   pantoprazole (PROTONIX) 40 MG tablet Take 40 mg by mouth 2 (two) times  daily.   TURMERIC PO Take by mouth daily.   VENTOLIN HFA 108 (90 Base) MCG/ACT inhaler Inhale 1-2 puffs into the lungs every 6 (six) hours as needed for wheezing or shortness of breath.   vitamin E 180 MG (400 UNITS) capsule Take 400 Units by mouth daily.   [DISCONTINUED] TRELEGY ELLIPTA 200-62.5-25 MCG/INH AEPB Take 1 puff by mouth daily.          Past Medical History:  Diagnosis Date   Alcohol use    daily   Anxiety    hx (05/05/2018)   Arthritis    "all over" (05/05/2018)   Asthma    Chronic bronchitis ([place])    Dyspnea    Family history of adverse reaction to anesthesia    "mom would have nausea after OR"    GERD (gastroesophageal reflux disease)    History of hiatal hernia    Hypertension    Pneumonia X 1  PONV (postoperative nausea and vomiting)    Seasonal allergies    "spring, summer, fall"        Objective:    Wt Readings from Last 3 Encounters:  11/02/21 196 lb 1.9 oz (89 kg)  10/29/21 192 lb 14.4 oz (87.5 kg)  08/20/21 192 lb (87.1 kg)    Vital signs reviewed  11/02/2021  - Note at rest 02 sats  97% on RA   General appearance:    amb wm nad     HEENT : pt wearing mask not removed for exam due to covid - 19 concerns.    NECK :  without JVD/Nodes/TM/ nl carotid upstrokes bilaterally   LUNGS: no acc muscle use,  Mild barrel  contour chest wall with bilateral  Distant bs s audible wheeze and  without cough on insp or exp maneuvers  and mild  Hyperresonant  to  percussion bilaterally     CV:  RRR  no s3 or murmur or increase in P2, and no edema   ABD:  soft and nontender with pos end  insp Hoover's  in the supine position. No bruits or organomegaly appreciated, bowel sounds nl  MS:   Nl gait/  ext warm without deformities, calf tenderness, cyanosis or clubbing No obvious joint restrictions   SKIN: warm and dry without lesions    NEURO:  alert, approp, nl sensorium with  no motor or cerebellar deficits apparent.             I personally reviewed  images and agree with radiology impression as follows:   Chest CT w contrast 10/22/21 1. Continued evolution of areas of post infectious or inflammatory scarring in the lungs. No definitive findings to suggest neoplasm on today's examination. Repeat noncontrast chest CT is recommended in 6 months to ensure continued regression of these findings. 2. Diffuse bronchial wall thickening with moderate to severe centrilobular and paraseptal emphysema     Assessment

## 2021-11-03 ENCOUNTER — Encounter: Payer: Self-pay | Admitting: Internal Medicine

## 2021-11-03 DIAGNOSIS — J310 Chronic rhinitis: Secondary | ICD-10-CM | POA: Insufficient documentation

## 2021-11-03 NOTE — Assessment & Plan Note (Signed)
Overuse of neo noted 11/02/2021   At risk of rhinitis medicamentosa if no already present > rec flonase   Reviewed:  I emphasized that nasal steroids have no immediate benefit in terms of improving symptoms.  To help them reached the target tissue, the patient should use Afrin two puffs every 12 hours applied one min before using the nasal steroids.  Afrin should be stopped after no more than 5 days.  If the symptoms worsen, Afrin can be restarted after 5 days off of therapy to prevent rebound congestion from overuse of Afrin.  I also emphasized that in no way are nasal steroids a concern in terms of "addiction".           Each maintenance medication was reviewed in detail including emphasizing most importantly the difference between maintenance and prns and under what circumstances the prns are to be triggered using an action plan format where appropriate.  Total time for H and P, chart review, counseling, reviewing hfa/dpi/ nasal steroid device(s) and generating customized AVS unique to this office visit / same day charting = 33 min

## 2021-11-03 NOTE — Assessment & Plan Note (Addendum)
Quit smoking cigs 06/2021  - 08/20/2021  After extensive coaching inhaler device,  effectiveness =  hfa   75% (short ti) > continue dpi trelegy with approp saba prn    Group D in terms of symptom/risk and laba/lama/ICS  therefore appropriate rx at this point >>>Trelegy 100 is the approp rx and should be used q am, not saba  Re SABA :  I spent extra time with pt today reviewing appropriate use of albuterol for prn use on exertion with the following points: 1) saba is for relief of sob that does not improve by walking a slower pace or resting but rather if the pt does not improve after trying this first. 2) If the pt is convinced, as many are, that saba helps recover from activity faster then it's easy to tell if this is the case by re-challenging : ie stop, take the inhaler, then p 5 minutes try the exact same activity (intensity of workload) that just caused the symptoms and see if they are substantially diminished or not after saba 3) if there is an activity that reproducibly causes the symptoms, try the saba 15 min before the activity on alternate days   If in fact the saba really does help, then fine to continue to use it prn but advised may need to look closer at the maintenance regimen being used to achieve better control of airways disease with exertion.    >>> f/u with pfts/ alpha one AT phenotyping on return

## 2021-11-03 NOTE — Assessment & Plan Note (Signed)
Acute onset of symptoms 06/12/21 with PET 07/19/21 c/w resolving inflammatory changes R apex - CT f/u scheduled for 10/22/21 >>>1. Continued evolution of areas of post infectious or inflammatory scarring in the lungs. No definitive findings to suggest neoplasm on today's examination. Repeat noncontrast chest CT is recommended in 6 months to ensure continued regression of these findings. 2. Diffuse bronchial wall thickening with moderate to severe centrilobular and paraseptal emphysema; imaging findings suggestive of underlying COPD.  Oncology plans one more f/u then he should be offered yearly low dose screening until the age of 82 per present guideline      Discussed in detail all the  indications, usual  risks and alternatives  relative to the benefits with patient who agrees to proceed with w/u as outlined.

## 2021-11-03 NOTE — Assessment & Plan Note (Signed)
D/c acei 08/20/2021 due to cough/ copd overlap>  Cough resolved as of 11/02/2021   Although even in retrospect it may not be clear the ACEi contributed to the pt's symptoms,  Pt improved off them and adding them back at this point or in the future would risk confusion in interpretation of non-specific respiratory symptoms to which this patient is prone  ie  Better not to muddy the waters here.   >>> bp ok on 40 mg benicar daily > f/u with pcp planned

## 2021-11-23 ENCOUNTER — Telehealth: Payer: Self-pay | Admitting: Internal Medicine

## 2021-11-23 NOTE — Telephone Encounter (Signed)
Fax received from Dr. Edmonia Lynch to perform a Right total hip replacment on patient.  Patient needs surgery clearance. Patient was seen on 11/02/21. Office protocol is a risk assessment can be sent to surgeon if patient has been seen in 60 days or less.   Sending to Dr. Melvyn Novas for risk assessment

## 2021-11-27 NOTE — Telephone Encounter (Signed)
Called and spoke with patient to ask him if he is willing to come to [facility] for PFT. He said yes. He has been scheduled for PFT. Will route to Dr. Melvyn Novas as Juluis Rainier  Next Appt With Pulmonology ([facility]-PFT RM) 12/21/2021 at 4:00 PM

## 2021-11-27 NOTE — Telephone Encounter (Signed)
Ideally needs pfts first - see if we can expedite in [place]

## 2021-12-21 ENCOUNTER — Ambulatory Visit (INDEPENDENT_AMBULATORY_CARE_PROVIDER_SITE_OTHER): Payer: Medicare HMO | Admitting: Internal Medicine

## 2021-12-21 ENCOUNTER — Other Ambulatory Visit: Payer: Self-pay

## 2021-12-21 DIAGNOSIS — C3490 Malignant neoplasm of unspecified part of unspecified bronchus or lung: Secondary | ICD-10-CM

## 2021-12-21 DIAGNOSIS — R911 Solitary pulmonary nodule: Secondary | ICD-10-CM

## 2021-12-21 LAB — PULMONARY FUNCTION TEST
DL/VA % pred: 81 %
DL/VA: 3.27 ml/min/mmHg/L
DLCO cor % pred: 67 %
DLCO cor: 17.29 ml/min/mmHg
DLCO unc % pred: 67 %
DLCO unc: 17.29 ml/min/mmHg
FEF 25-75 Pre: 0.43 L/sec
FEF2575-%Pred-Pre: 18 %
FEV1-%Pred-Pre: 43 %
FEV1-Pre: 1.4 L
FEV1FVC-%Pred-Pre: 77 %
FEV6-%Pred-Pre: 55 %
FEV6-Pre: 2.27 L
FEV6FVC-%Pred-Pre: 98 %
FVC-%Pred-Pre: 56 %
FVC-Pre: 2.45 L
Pre FEV1/FVC ratio: 57 %
Pre FEV6/FVC Ratio: 93 %
RV % pred: 136 %
RV: 3.39 L
TLC % pred: 91 %
TLC: 6.4 L

## 2021-12-21 NOTE — Patient Instructions (Signed)
Spirometr, DLCO and plethysmography performed today.

## 2021-12-21 NOTE — Telephone Encounter (Signed)
Patient did PFT today 12/21/21 Dr. Melvyn Novas please advise if he is cleared for Right total hip replacment

## 2021-12-21 NOTE — Progress Notes (Signed)
Spirometry pre/post and plethysmography performed today.Post spirometry not performed due to Albuterol usage in lobby before appointment.

## 2021-12-24 NOTE — Telephone Encounter (Signed)
OV notes at clearance form have been faxed back to [facility]. Nothing further needed at this time.

## 2021-12-24 NOTE — Telephone Encounter (Signed)
GOLD 3 moderately severe COPD  but would not preclude hip surgery unless actively flaring so ok for surgery

## 2021-12-30 NOTE — Progress Notes (Signed)
Cardiology Office Note:   Date:  12/31/2021  NAME:  Jeremy [name]    MRN: [id number] DOB:  1951-09-07   PCP:  Redmond School, MD  Cardiologist:  None  Electro  Referring MD: [name], MD   Chief Complaint  Patient presents with   Pre-op Exam    History of Present Illness:   Jeremy [name] is a 71 y.o. male with a hx of COPD, coronary calcifications, HTN, tobacco abuse who is being seen today for the evaluation of preoperative assessment at the request of Redmond School, MD. he reports he will have right hip surgery with Dr. Percell Miller in the next few weeks.  Sent for cardiac evaluation.  He informs me he has severe COPD.  PFT review shows this is true.  He reports he gets short of breath with minimal activity.  He cannot climb a flight of stairs.  He is limited by his hips as well as his lungs.  He has never had a heart attack or stroke.  60 pack years.  He also has hypertension.  BP elevated today.  He reports it is much improved at home.  Recent CT scan of his chest demonstrates significant coronary calcifications.  Cardiovascular examination unremarkable.  No lower extremity edema.  He reports no chest tightness or pressure.  No shortness of breath with activity.  He does take an aspirin daily.  I have recommended he continue this.  He apparently had left hip surgery in 2019.  They had to do local anesthesia due to his lung disease.  I am unable to view his cholesterol profile.  Family history is significant for heart disease.  He reports 3 liquor drinks nightly.  No drug use reported.  He is retired.  He presents with his wife today.  He is not diabetic.  He has normal kidney function.  No prior history of MI.  Problem List COPD -moderate to severe HTN Tobaccco abuse Coronary calcifications (chest ct 10/23/2021)  Past Medical History: Past Medical History:  Diagnosis Date   Alcohol use    daily   Anxiety    hx (05/05/2018)   Arthritis    "all over" (05/05/2018)    Asthma    Chronic bronchitis ([place])    Dyspnea    Family history of adverse reaction to anesthesia    "mom would have nausea after OR"    GERD (gastroesophageal reflux disease)    History of hiatal hernia    Hypertension    Pneumonia X 1   PONV (postoperative nausea and vomiting)    Seasonal allergies    "spring, summer, fall"    Past Surgical History: Past Surgical History:  Procedure Laterality Date   ANTERIOR CERVICAL DECOMP/DISCECTOMY FUSION Left    BACK SURGERY     COLONOSCOPY     JOINT REPLACEMENT     LAPAROSCOPIC CHOLECYSTECTOMY     LUMBAR DISC SURGERY  X 2   L4-5   NASAL POLYP EXCISION  ~ [place] Left 05/05/2018   TOTAL HIP ARTHROPLASTY Left 05/05/2018   Procedure: LEFT TOTAL HIP ARTHROPLASTY ANTERIOR APPROACH;  Surgeon: [name], MD;  Location: [facility];  Service: Orthopedics;  Laterality: Left;    Current Medications: Current Meds  Medication Sig   amLODipine (NORVASC) 10 MG tablet Take 10 mg by mouth daily.   aspirin EC 81 MG tablet Take 1 tablet (81 mg total) by mouth 2 (two) times daily. For  DVT prophylaxis (Patient taking differently: Take 81 mg by mouth daily. For DVT prophylaxis)   atorvastatin (LIPITOR) 40 MG tablet Take 1 tablet (40 mg total) by mouth daily.   cholecalciferol (VITAMIN D) 1000 units tablet Take 1,000 Units by mouth daily.   Cyanocobalamin (B-12 COMPLIANCE INJECTION IJ) Inject as directed.   diltiazem (CARDIZEM CD) 240 MG 24 hr capsule Take 240 mg by mouth daily.    diphenoxylate-atropine (LOMOTIL) 2.5-0.025 MG tablet Take 1 tablet by mouth 4 (four) times daily as needed for diarrhea or loose stools.   Fluticasone-Umeclidin-Vilant (TRELEGY ELLIPTA) 100-62.5-25 MCG/ACT AEPB One first thing am   Glucosamine HCl (GLUCOSAMINE PO) Take 2 tablets by mouth daily.   meloxicam (MOBIC) 7.5 MG tablet Take 7.5 mg by mouth daily.   olmesartan (BENICAR) 40 MG tablet Take 1 tablet (40 mg total) by mouth daily.    pantoprazole (PROTONIX) 40 MG tablet Take 40 mg by mouth 2 (two) times daily.   TURMERIC PO Take by mouth daily.   VENTOLIN HFA 108 (90 Base) MCG/ACT inhaler Inhale 1-2 puffs into the lungs every 6 (six) hours as needed for wheezing or shortness of breath.   vitamin E 180 MG (400 UNITS) capsule Take 400 Units by mouth daily.     Allergies:    Penicillins and Tape   Social History: Social History   Socioeconomic History   Marital status: Married    Spouse name: Not on file   Number of children: 2   Years of education: Not on file   Highest education level: Not on file  Occupational History   Occupation: Retired   Occupation: retired  Tobacco Use   Smoking status: Former    Packs/day: 1.00    Years: 60.00    Pack years: 60.00    Types: Cigarettes, Pipe    Quit date: 06/13/2021    Years since quitting: 0.5   Smokeless tobacco: Never  Vaping Use   Vaping Use: Some days  Substance and Sexual Activity   Alcohol use: Yes    Alcohol/week: 3.0 standard drinks    Types: 3 Shots of liquor per week   Drug use: Never   Sexual activity: Not Currently  Other Topics Concern   Not on file  Social History Narrative   Not on file   Social Determinants of Health   Financial Resource Strain: Low Risk    Difficulty of Paying Living Expenses: Not hard at all  Food Insecurity: No Food Insecurity   Worried About Charity fundraiser in the Last Year: Never true   [place] in the Last Year: Never true  Transportation Needs: No Transportation Needs   Lack of Transportation (Medical): No   Lack of Transportation (Non-Medical): No  Physical Activity: Inactive   Days of Exercise per Week: 0 days   Minutes of Exercise per Session: 0 min  Stress: No Stress Concern Present   Feeling of Stress : Not at all  Social Connections: Moderately Integrated   Frequency of Communication with Friends and Family: More than three times a week   Frequency of Social Gatherings with Friends and  Family: More than three times a week   Attends Religious Services: Never   Marine scientist or Organizations: Yes   Attends Music therapist: More than 4 times per year   Marital Status: Married     Family History: The patient's family history includes Heart disease in his father and mother.  ROS:  All other ROS reviewed and negative. Pertinent positives noted in the HPI.     EKGs/Labs/Other Studies Reviewed:   The following studies were personally reviewed by me today:  EKG dated 06/14/2021 reviewed in office which demonstrates normal sinus rhythm heart rate 89, no acute ischemic changes or evidence of infarction.  TTE 06/16/2021  1. Left ventricular ejection fraction, by estimation, is 60 to 65%. The  left ventricle has normal function. The left ventricle has no regional  wall motion abnormalities. Left ventricular diastolic parameters were  normal.   2. Right ventricular systolic function is normal. The right ventricular  size is normal. Tricuspid regurgitation signal is inadequate for assessing  PA pressure.   3. The mitral valve is normal in structure. No evidence of mitral valve  regurgitation. No evidence of mitral stenosis.   4. The aortic valve is normal in structure. Aortic valve regurgitation is  not visualized. No aortic stenosis is present.   5. The inferior vena cava is dilated in size with >50% respiratory  variability, suggesting right atrial pressure of 8 mmHg.   Recent Labs: 06/16/2021: Magnesium 2.1; TSH 2.822 10/22/2021: ALT 26; BUN 7; Creatinine, Ser 0.71; Hemoglobin 16.5; Platelets 268; Potassium 4.2; Sodium 133   Recent Lipid Panel No results found for: CHOL, TRIG, HDL, CHOLHDL, VLDL, LDLCALC, LDLDIRECT  Physical Exam:   VS:  BP (!) 160/90    Pulse 85    Ht 5\' 10"  (1.778 m)    Wt 200 lb (90.7 kg)    SpO2 96%    BMI 28.70 kg/m    Wt Readings from Last 3 Encounters:  12/31/21 200 lb (90.7 kg)  11/02/21 196 lb 1.9 oz (89 kg)  10/29/21  192 lb 14.4 oz (87.5 kg)    General: Well nourished, well developed, in no acute distress Head: Atraumatic, normal size  Eyes: PEERLA, EOMI  Neck: Supple, no JVD Endocrine: No thryomegaly Cardiac: Normal S1, S2; RRR; no murmurs, rubs, or gallops Lungs: Diminished breath sounds bilateral Abd: Soft, nontender, no hepatomegaly  Ext: No edema, pulses 2+ Musculoskeletal: No deformities, BUE and BLE strength normal and equal Skin: Warm and dry, no rashes   Neuro: Alert and oriented to person, place, time, and situation, CNII-XII grossly intact, no focal deficits  Psych: Normal mood and affect   ASSESSMENT:   Jeremy [name] is a 71 y.o. male who presents for the following: 1. Preop cardiovascular exam   2. Coronary artery calcification seen on CAT scan   3. Mixed hyperlipidemia   4. SOB (shortness of breath)     PLAN:   1. Preop cardiovascular exam 2. Coronary artery calcification seen on CAT scan 3. Mixed hyperlipidemia 4. SOB (shortness of breath) -He presents for preoperative assessment.  His RCRI equals 0 which equates to a 0.4% (very low risk) risk of major perioperative cardiovascular event.  This does not account for his moderate severe COPD.  He also is very inactive and cannot complete 4 METS.  He has a recent CT of his chest which demonstrates extensive coronary calcifications including left main calcifications.  He reports no chest tightness.  He does report shortness of breath.  Given his poor functional status I have recommended a stress test prior to surgery.  He had an echocardiogram in July of last year which was normal.  I have also recommend he start an aspirin 81 mg daily regimen.  I have also recommended he start Lipitor 40 mg daily.  We will need to obtain his  primary care physician cholesterol results.  He does need to have his LDL cholesterol less than 70.  EKG in July of this year was normal.  He really reports no change in symptoms other than shortness of breath  which I suspect is COPD related.  I have recommended he reach out to his pulmonologist regarding clearance from a pulmonary standpoint.  I believe his bigger issue likely will end up being his lungs.  He will see [place] yearly.  I will notify Dr. Percell Miller of the results of his stress test.  Disposition: Return in about 1 year (around 12/31/2022).  Medication Adjustments/Labs and Tests Ordered: Current medicines are reviewed at length with the patient today.  Concerns regarding medicines are outlined above.  Orders Placed This Encounter  Procedures   NM Myocar Multi W/Spect W/Wall Motion / EF   Meds ordered this encounter  Medications   atorvastatin (LIPITOR) 40 MG tablet    Sig: Take 1 tablet (40 mg total) by mouth daily.    Dispense:  90 tablet    Refill:  3    Patient Instructions  Medication Instructions: START Atorvastatin 40 mg daily   Labwork: none  Testing/Procedures: Your physician has requested that you have a lexiscan myoview. For further information please visit [email or web address]. Please follow instruction sheet, as given.   Follow-Up: 1 year    If you need a refill on your cardiac medications before your next appointment, please call your pharmacy.     Signed, Addison Naegeli. Audie Box, MD, [facility]  [address], [place], [place] [address] [phone]  12/31/2021 8:43 AM

## 2021-12-31 ENCOUNTER — Encounter: Payer: Self-pay | Admitting: Cardiovascular Disease

## 2021-12-31 ENCOUNTER — Other Ambulatory Visit: Payer: Self-pay

## 2021-12-31 ENCOUNTER — Ambulatory Visit: Payer: Medicare HMO | Admitting: Cardiovascular Disease

## 2021-12-31 VITALS — BP 160/90 | HR 85 | Ht 70.0 in | Wt 200.0 lb

## 2021-12-31 DIAGNOSIS — R0602 Shortness of breath: Secondary | ICD-10-CM

## 2021-12-31 DIAGNOSIS — E782 Mixed hyperlipidemia: Secondary | ICD-10-CM | POA: Diagnosis not present

## 2021-12-31 DIAGNOSIS — Z01810 Encounter for preprocedural cardiovascular examination: Secondary | ICD-10-CM

## 2021-12-31 DIAGNOSIS — I2510 Atherosclerotic heart disease of native coronary artery without angina pectoris: Secondary | ICD-10-CM | POA: Diagnosis not present

## 2021-12-31 MED ORDER — ATORVASTATIN CALCIUM 40 MG PO TABS: 40.0000 mg | ORAL_TABLET | Freq: Every day | ORAL | 3 refills | Status: DC

## 2021-12-31 NOTE — Patient Instructions (Signed)
Medication Instructions: START Atorvastatin 40 mg daily   Labwork: none  Testing/Procedures: Your physician has requested that you have a lexiscan myoview. For further information please visit [email or web address]. Please follow instruction sheet, as given.   Follow-Up: 1 year    If you need a refill on your cardiac medications before your next appointment, please call your pharmacy.

## 2022-01-08 ENCOUNTER — Telehealth: Payer: Self-pay | Admitting: Cardiovascular Disease

## 2022-01-08 NOTE — Telephone Encounter (Signed)
Pt notified that he can take all medications prior to lexi scan stress test. Pt thankful for the call.

## 2022-01-08 NOTE — Telephone Encounter (Signed)
Pt c/o medication issue:  1. Name of Medications:  amLODipine (NORVASC) 10 MG tablet olmesartan (BENICAR) 40 MG tablet diltiazem (CARDIZEM CD) 240 MG 24 hr capsule Extra Strength Tylenol 500 mg   2. How are you currently taking this medication (dosage and times per day)? As directed  3. Are you having a reaction (difficulty breathing--STAT)?   4. What is your medication issue? Patient wanted to know if he could take these medications before  his stress test tomorrow morning

## 2022-01-09 ENCOUNTER — Encounter (HOSPITAL_COMMUNITY): Payer: Self-pay

## 2022-01-09 ENCOUNTER — Ambulatory Visit (HOSPITAL_COMMUNITY)
Admission: RE | Admit: 2022-01-09 | Discharge: 2022-01-09 | Disposition: A | Discharge status: Home / Self Care | Payer: Medicare HMO | Source: Ambulatory Visit | Attending: Cardiovascular Disease | Admitting: Cardiovascular Disease

## 2022-01-09 ENCOUNTER — Encounter (HOSPITAL_COMMUNITY)
Admission: RE | Admit: 2022-01-09 | Discharge: 2022-01-09 | Disposition: A | Discharge status: Home / Self Care | Payer: Medicare HMO | Source: Ambulatory Visit | Attending: Cardiovascular Disease | Admitting: Cardiovascular Disease

## 2022-01-09 ENCOUNTER — Other Ambulatory Visit: Payer: Self-pay

## 2022-01-09 DIAGNOSIS — R0602 Shortness of breath: Secondary | ICD-10-CM | POA: Insufficient documentation

## 2022-01-09 LAB — NM MYOCAR MULTI W/SPECT W/WALL MOTION / EF
LV dias vol: 93 mL (ref 62–150)
LV sys vol: 42 mL
Nuc Stress EF: 55 %
Peak HR: 90 {beats}/min
RATE: 0.3
Rest HR: 76 {beats}/min
Rest Nuclear Isotope Dose: 11 mCi
SDS: 3
SRS: 0
SSS: 3
ST Depression (mm): 0 mm
Stress Nuclear Isotope Dose: 33 mCi
TID: 1.11

## 2022-01-09 MED ORDER — SODIUM CHLORIDE FLUSH 0.9 % IV SOLN
INTRAVENOUS | Status: AC
  Administered 2022-01-09: 10 mL via INTRAVENOUS
  Filled 2022-01-09: qty 10

## 2022-01-09 MED ORDER — TECHNETIUM TC 99M TETROFOSMIN IV KIT
30.0000 | PACK | Freq: Once | INTRAVENOUS | Status: AC | PRN
  Administered 2022-01-09: 33 via INTRAVENOUS

## 2022-01-09 MED ORDER — TECHNETIUM TC 99M TETROFOSMIN IV KIT
10.0000 | PACK | Freq: Once | INTRAVENOUS | Status: AC | PRN
  Administered 2022-01-09: 11 via INTRAVENOUS

## 2022-01-09 MED ORDER — REGADENOSON 0.4 MG/5ML IV SOLN
INTRAVENOUS | Status: AC
  Administered 2022-01-09: 0.4 mg via INTRAVENOUS
  Filled 2022-01-09: qty 5

## 2022-01-14 ENCOUNTER — Telehealth: Payer: Self-pay | Admitting: Internal Medicine

## 2022-01-14 NOTE — Telephone Encounter (Signed)
Patient was cleared for surgery by Dr. Melvyn Novas in December 2022. Patient had PFT on 12/21/21 and wasn't able to complete PFT and wants to verify that Dr. Melvyn Novas is still okay with him having surgery.   Please advise.

## 2022-01-14 NOTE — Telephone Encounter (Signed)
Yes cleared for surgery

## 2022-01-15 ENCOUNTER — Telehealth: Payer: Self-pay | Admitting: Cardiovascular Disease

## 2022-01-15 NOTE — Telephone Encounter (Signed)
Patient returning call for stress test results to see if he is cleared for surgery.

## 2022-01-15 NOTE — Telephone Encounter (Signed)
Spoke to  patient.  Below information was given to patient . Patient verbalized understanding.    Jeremy Rile, MD  01/11/2022 12:23 PM EST     Normal stress test. He may proceed to surgery at acceptable risk. I cc'ed Dr. Percell Miller so he is aware.    [place] T. Audie Box, MD, [facility]  [address] [place], [place] [address] [phone]  12:23 PM

## 2022-01-16 NOTE — Telephone Encounter (Signed)
Tanda Rockers, MD     11:41 AM Note GOLD 3 moderately severe COPD  but would not preclude hip surgery unless actively flaring so ok for surgery      Called and spoke with patient to let him know that he is cleared for surgery. Patient expressed understanding. Nothing further needed at this time.

## 2022-02-05 ENCOUNTER — Ambulatory Visit: Payer: Self-pay | Admitting: Orthopedic Surgery

## 2022-02-05 DIAGNOSIS — Z87891 Personal history of nicotine dependence: Secondary | ICD-10-CM

## 2022-02-05 NOTE — Progress Notes (Signed)
Sent message, via epic in basket, requesting orders in epic from surgeon.  

## 2022-02-12 NOTE — Progress Notes (Addendum)
COVID swab appointment: ? ?COVID Vaccine Completed: yes x4 ?Date COVID Vaccine completed: 01/19/20, 02/16/20 ?Has received booster: 09/29/20, 04/27/21 ?COVID vaccine manufacturer: Moderna  ? ?Date of COVID positive in last 90 days: no ? ?PCP - Redmond School, MD ?Cardiologist - Eleonore Chiquito ? ?Cardiac clearance by Eleonore Chiquito 12/31/21 in Epic ?Medical clearance 01/16/22 by Christinia Gully in [facility] ? ?Chest x-ray - 10/22/21 Epic ?EKG - 08/27/21 on chart ?Stress Test - 01/10/20 Epic ?ECHO - 06/16/21 Epic ?Cardiac Cath - n/a ?Pacemaker/ICD device last checked: n/a ?Spinal Cord Stimulator: n/a ? ?Bowel Prep - no ? ?Sleep Study - yes, positive ?CPAP - prescribed hasn't used since July 2022 ? ?Fasting Blood Sugar - n/a ?Checks Blood Sugar _____ times a day ? ?Blood Thinner Instructions: ?Aspirin Instructions: ASA 81, hold 7 days ?Last Dose: 02/18/22 0800 ? ?Activity level: Can perform activities of daily living without stopping and without symptoms of chest pain. Can do 4 steps, not whole flight due to hip. SOB with activity pt has COPD.   ? ?Anesthesia review: HTN, COPD, coronary calcifications, alcohol, PONV, dyspnea, lung mass ? ?Patient denies shortness of breath, fever, cough and chest pain at PAT appointment ? ? ?Patient verbalized understanding of instructions that were given to them at the PAT appointment. Patient was also instructed that they will need to review over the PAT instructions again at home before surgery.  ?

## 2022-02-12 NOTE — Patient Instructions (Addendum)
DUE TO COVID-19 ONLY ONE VISITOR  (aged 73 and older)  IS ALLOWED TO COME WITH YOU AND STAY IN THE WAITING ROOM ONLY DURING PRE OP AND PROCEDURE.   ?**NO VISITORS ARE ALLOWED IN THE SHORT STAY AREA OR RECOVERY ROOM!!** ? ?IF YOU WILL BE ADMITTED INTO THE HOSPITAL YOU ARE ALLOWED ONLY TWO SUPPORT PEOPLE DURING VISITATION HOURS ONLY (7 AM -8PM)   ?The support person(s) must pass our screening, gel in and out, and wear a mask at all times, including in the patient?s room. ?Patients must also wear a mask when staff or their support person are in the room. ?Visitors GUEST BADGE MUST BE WORN VISIBLY  ?One adult visitor may remain with you overnight and MUST be in the room by 8 P.M. ?      ? Your procedure is scheduled on: 02/26/22 ? ? Report to [facility] Main Entrance ? ?  Report to admitting at 5:15 AM ? ? Call this number if you have problems the morning of surgery [phone] ? ? Do not eat food :After Midnight. ? ? After Midnight you may have the following liquids until 4:30 AM DAY OF SURGERY ? ?Water ?Black Coffee (sugar ok, NO MILK/CREAM OR CREAMERS)  ?Tea (sugar ok, NO MILK/CREAM OR CREAMERS) regular and decaf                             ?Plain Jell-O (NO RED)                                           ?Fruit ices (not with fruit pulp, NO RED)                                     ?Popsicles (NO RED)                                                                  ?Juice: apple, WHITE grape, WHITE cranberry ?Sports drinks like Gatorade (NO RED) ?Clear broth(vegetable,chicken,beef) ?  ?The day of surgery:  ?Drink ONE (1) Pre-Surgery Clear Ensure at 4:30 AM the morning of surgery. Drink in one sitting. Do not sip.  ?This drink was given to you during your hospital  ?pre-op appointment visit. ?Nothing else to drink after completing the  ?Pre-Surgery Clear Ensure. ?  ?       If you have questions, please contact your surgeon?s office. ? ? ?FOLLOW BOWEL PREP AND ANY ADDITIONAL PRE OP INSTRUCTIONS YOU RECEIVED  FROM YOUR SURGEON'S OFFICE!!! ?  ?  ?Oral Hygiene is also important to reduce your risk of infection.                                    ?Remember - BRUSH YOUR TEETH THE MORNING OF SURGERY WITH YOUR REGULAR TOOTHPASTE ? ? Do NOT smoke after Midnight ? ? Take these medicines the morning of surgery with A SIP OF WATER: Tylenol, Xanax, Amlodipine, Atorvastatin, Diltiazem,  Pantoprazole ?                  ?           You may not have any metal on your body including jewelry, and body piercing ? ?           Do not wear lotions, powders, cologne, or deodorant ? ?            Men may shave face and neck. ? ? Do not bring valuables to the hospital. [facility] NOT ?            RESPONSIBLE   FOR VALUABLES. ? ? Contacts, dentures or bridgework may not be worn into surgery. ? ? Bring small overnight bag day of surgery. ? ?            Please read over the following fact sheets you were given: IF [facility] [phone]- Apolonio Schneiders ? ?   [facility] - Preparing for Surgery ?Before surgery, you can play an important role.  Because skin is not sterile, your skin needs to be as free of germs as possible.  You can reduce the number of germs on your skin by washing with CHG (chlorahexidine gluconate) soap before surgery.  CHG is an antiseptic cleaner which kills germs and bonds with the skin to continue killing germs even after washing. ?Please DO NOT use if you have an allergy to CHG or antibacterial soaps.  If your skin becomes reddened/irritated stop using the CHG and inform your nurse when you arrive at Short Stay. ?Do not shave (including legs and underarms) for at least 48 hours prior to the first CHG shower.  You may shave your face/neck. ? ?Please follow these instructions carefully: ? 1.  Shower with CHG Soap the night before surgery and the  morning of surgery. ? 2.  If you choose to wash your hair, wash your hair first as usual with your normal  shampoo. ? 3.  After you  shampoo, rinse your hair and body thoroughly to remove the shampoo.                            ? 4.  Use CHG as you would any other liquid soap.  You can apply chg directly to the skin and wash.  Gently with a scrungie or clean washcloth. ? 5.  Apply the CHG Soap to your body ONLY FROM THE NECK DOWN.   Do   not use on face/ open      ?                     Wound or open sores. Avoid contact with eyes, ears mouth and   genitals (private parts).  ?                     Production manager,  Genitals (private parts) with your normal soap. ?            6.  Wash thoroughly, paying special attention to the area where your    surgery  will be performed. ? 7.  Thoroughly rinse your body with warm water from the neck down. ? 8.  DO NOT shower/wash with your normal soap after using and rinsing off the CHG Soap. ?  9.  Pat yourself dry with a clean towel. ?           10.  Wear clean pajamas. ?           11.  Place clean sheets on your bed the night of your first shower and do not  sleep with pets. ?Day of Surgery : ?Do not apply any lotions/deodorants the morning of surgery.  Please wear clean clothes to the hospital/surgery center. ? ?FAILURE TO FOLLOW THESE INSTRUCTIONS MAY RESULT IN THE CANCELLATION OF YOUR SURGERY ? ?PATIENT SIGNATURE_________________________________ ? ?NURSE SIGNATURE__________________________________ ? ?________________________________________________________________________  ? ?Incentive Spirometer ? ?An incentive spirometer is a tool that can help keep your lungs clear and active. This tool measures how well you are filling your lungs with each breath. Taking long deep breaths may help reverse or decrease the chance of developing breathing (pulmonary) problems (especially infection) following: ?A long period of time when you are unable to move or be active. ?BEFORE THE PROCEDURE  ?If the spirometer includes an indicator to show your best effort, your nurse or respiratory therapist will set it to a  desired goal. ?If possible, sit up straight or lean slightly forward. Try not to slouch. ?Hold the incentive spirometer in an upright position. ?INSTRUCTIONS FOR USE  ?Sit on the edge of your bed if possible, or sit up as far as you can in bed or on a chair. ?Hold the incentive spirometer in an upright position. ?Breathe out normally. ?Place the mouthpiece in your mouth and seal your lips tightly around it. ?Breathe in slowly and as deeply as possible, raising the piston or the ball toward the top of the column. ?Hold your breath for 3-5 seconds or for as long as possible. Allow the piston or ball to fall to the bottom of the column. ?Remove the mouthpiece from your mouth and breathe out normally. ?Rest for a few seconds and repeat Steps 1 through 7 at least 10 times every 1-2 hours when you are awake. Take your time and take a few normal breaths between deep breaths. ?The spirometer may include an indicator to show your best effort. Use the indicator as a goal to work toward during each repetition. ?After each set of 10 deep breaths, practice coughing to be sure your lungs are clear. If you have an incision (the cut made at the time of surgery), support your incision when coughing by placing a pillow or rolled up towels firmly against it. ?Once you are able to get out of bed, walk around indoors and cough well. You may stop using the incentive spirometer when instructed by your caregiver.  ?RISKS AND COMPLICATIONS ?Take your time so you do not get dizzy or light-headed. ?If you are in pain, you may need to take or ask for pain medication before doing incentive spirometry. It is harder to take a deep breath if you are having pain. ?AFTER USE ?Rest and breathe slowly and easily. ?It can be helpful to keep track of a log of your progress. Your caregiver can provide you with a simple table to help with this. ?If you are using the spirometer at home, follow these instructions: ?SEEK MEDICAL CARE IF:  ?You are having  difficultly using the spirometer. ?You have trouble using the spirometer as often as instructed. ?Your pain medication is not giving enough relief while using the spirometer. ?You develop fever of 100.5? F (38.1?

## 2022-02-13 ENCOUNTER — Encounter (HOSPITAL_COMMUNITY): Payer: Self-pay

## 2022-02-13 ENCOUNTER — Other Ambulatory Visit: Payer: Self-pay

## 2022-02-13 ENCOUNTER — Encounter (HOSPITAL_COMMUNITY)
Admission: RE | Admit: 2022-02-13 | Discharge: 2022-02-13 | Disposition: A | Discharge status: Home / Self Care | Payer: Medicare HMO | Source: Ambulatory Visit | Attending: Orthopedic Surgery | Admitting: Orthopedic Surgery

## 2022-02-13 VITALS — BP 143/73 | HR 80 | Temp 98.0°F | Resp 18 | Ht 69.0 in | Wt 196.0 lb

## 2022-02-13 DIAGNOSIS — I10 Essential (primary) hypertension: Secondary | ICD-10-CM | POA: Diagnosis not present

## 2022-02-13 DIAGNOSIS — Z01812 Encounter for preprocedural laboratory examination: Secondary | ICD-10-CM | POA: Diagnosis present

## 2022-02-13 DIAGNOSIS — Z01818 Encounter for other preprocedural examination: Secondary | ICD-10-CM

## 2022-02-13 HISTORY — DX: Chronic obstructive pulmonary disease, unspecified: J44.9

## 2022-02-13 HISTORY — DX: Sleep apnea, unspecified: G47.30

## 2022-02-13 LAB — BASIC METABOLIC PANEL
Anion gap: 12 (ref 5–15)
BUN: 10 mg/dL (ref 8–23)
CO2: 24 mmol/L (ref 22–32)
Calcium: 9.3 mg/dL (ref 8.9–10.3)
Chloride: 95 mmol/L — ABNORMAL LOW (ref 98–111)
Creatinine, Ser: 0.85 mg/dL (ref 0.61–1.24)
GFR, Estimated: >60 mL/min (ref >60)
Glucose, Bld: 106 mg/dL — ABNORMAL HIGH (ref 70–99)
Potassium: 3.6 mmol/L (ref 3.5–5.1)
Sodium: 131 mmol/L — ABNORMAL LOW (ref 135–145)

## 2022-02-13 LAB — CBC
HCT: 48.1 % (ref 39.0–52.0)
Hemoglobin: 16.8 g/dL (ref 13.0–17.0)
MCH: 34.4 pg — ABNORMAL HIGH (ref 26.0–34.0)
MCHC: 34.9 g/dL (ref 30.0–36.0)
MCV: 98.4 fL (ref 80.0–100.0)
Platelets: 341 10*3/uL (ref 150–400)
RBC: 4.89 MIL/uL (ref 4.22–5.81)
RDW: 12.2 % (ref 11.5–15.5)
WBC: 9 10*3/uL (ref 4.0–10.5)
nRBC: 0 % (ref 0.0–0.2)

## 2022-02-13 LAB — SURGICAL PCR SCREEN
MRSA, PCR: NEGATIVE
Staphylococcus aureus: NEGATIVE

## 2022-02-14 LAB — TYPE AND SCREEN
ABO/RH(D): O POS
Antibody Screen: NEGATIVE

## 2022-02-18 NOTE — Anesthesia Preprocedure Evaluation (Deleted)
Anesthesia Evaluation  ? ? ?History of Anesthesia Complications ?(+) PONV, Family history of anesthesia reaction and history of anesthetic complications ? ?Airway ? ? ? ? ? ? ? Dental ?  ?Pulmonary ?former smoker,  ?  ? ? ? ? ? ? ? Cardiovascular ?hypertension,  ? ? ?  ?Neuro/Psych ?  ? GI/Hepatic ?  ?Endo/Other  ? ? Renal/GU ?  ? ?  ?Musculoskeletal ? ? Abdominal ?  ?Peds ? Hematology ?  ?Anesthesia Other Findings ? ? Reproductive/Obstetrics ? ?  ? ? ? ? ? ? ? ? ? ? ? ? ? ?  ?  ? ? ? ? ? ? ?                                  Anesthesia Evaluation  ?Patient identified by MRN, date of birth, ID band ?Patient awake ? ? ? ?Reviewed: ?Allergy & Precautions, H&P , NPO status , Patient's Chart, lab work & pertinent test results, reviewed documented beta blocker date and time  ? ?History of Anesthesia Complications ?(+) PONV and history of anesthetic complications ? ?Airway ?Mallampati: I ? ?TM Distance: >3 FB ?Neck ROM: full ? ? ? Dental ?no notable dental hx. ?(+) Teeth Intact, Poor Dentition ?  ?Pulmonary ?neg pulmonary ROS, Current Smoker,  ?  ?Pulmonary exam normal ?breath sounds clear to auscultation ? ? ? ? ? ? Cardiovascular ?Exercise Tolerance: Good ?hypertension, Pt. on medications ?negative cardio ROS ? ? ?Rhythm:regular Rate:Normal ? ? ?  ?Neuro/Psych ?Anxiety negative neurological ROS ?   ? GI/Hepatic ?Neg liver ROS, hiatal hernia,   ?Endo/Other  ?negative endocrine ROS ? Renal/GU ?negative Renal ROS  ?negative genitourinary ?  ?Musculoskeletal ? ?(+) Arthritis , Osteoarthritis,   ? Abdominal ?  ?Peds ? Hematology ?negative hematology ROS ?(+)   ?Anesthesia Other Findings ? ? Reproductive/Obstetrics ?negative OB ROS ? ?  ? ? ? ? ? ? ? ? ? ? ? ? ? ?  ?  ? ? ? ? ? ? ? ?Anesthesia Physical ?Anesthesia Plan ? ?ASA: III ? ?Anesthesia Plan: Spinal  ? ?Post-op Pain Management:   ? ?Induction:  ? ?PONV Risk Score and Plan: 2 and Ondansetron and Treatment may vary due to age or medical  condition ? ?Airway Management Planned: Nasal Cannula, Natural Airway and Mask ? ?Additional Equipment:  ? ?Intra-op Plan:  ? ?Post-operative Plan:  ? ?Informed Consent: I have reviewed the patients History and Physical, chart, labs and discussed the procedure including the risks, benefits and alternatives for the proposed anesthesia with the patient or authorized representative who has indicated his/her understanding and acceptance.  ? ?Dental Advisory Given ? ?Plan Discussed with: CRNA, Anesthesiologist and Surgeon ? ?Anesthesia Plan Comments: ( ? ?)  ? ? ? ? ? ?Anesthesia Quick Evaluation ?                                  Anesthesia Evaluation  ?Patient identified by MRN, date of birth, ID band ?Patient awake ? ? ? ?Reviewed: ?Allergy & Precautions, H&P , NPO status , Patient's Chart, lab work & pertinent test results, reviewed documented beta blocker date and time  ? ?History of Anesthesia Complications ?(+) PONV and history of anesthetic complications ? ?Airway ?Mallampati: I ? ?TM Distance: >3 FB ?Neck ROM: full ? ? ? Dental ?no notable dental hx. ?(+)  Teeth Intact, Poor Dentition ?  ?Pulmonary ?neg pulmonary ROS, Current Smoker,  ?  ?Pulmonary exam normal ?breath sounds clear to auscultation ? ? ? ? ? ? Cardiovascular ?Exercise Tolerance: Good ?hypertension, Pt. on medications ?negative cardio ROS ? ? ?Rhythm:regular Rate:Normal ? ? ?  ?Neuro/Psych ?Anxiety negative neurological ROS ?   ? GI/Hepatic ?Neg liver ROS, hiatal hernia,   ?Endo/Other  ?negative endocrine ROS ? Renal/GU ?negative Renal ROS  ?negative genitourinary ?  ?Musculoskeletal ? ?(+) Arthritis , Osteoarthritis,   ? Abdominal ?  ?Peds ? Hematology ?negative hematology ROS ?(+)   ?Anesthesia Other Findings ? ? Reproductive/Obstetrics ?negative OB ROS ? ?  ? ? ? ? ? ? ? ? ? ? ? ? ? ?  ?  ? ? ? ? ? ? ? ?Anesthesia Physical ?Anesthesia Plan ? ?ASA: III ? ?Anesthesia Plan: Spinal  ? ?Post-op Pain Management:   ? ?Induction:  ? ?PONV Risk Score and  Plan: 2 and Ondansetron and Treatment may vary due to age or medical condition ? ?Airway Management Planned: Nasal Cannula, Natural Airway and Mask ? ?Additional Equipment:  ? ?Intra-op Plan:  ? ?Post-operative Plan:  ? ?Informed Consent: I have reviewed the patients History and Physical, chart, labs and discussed the procedure including the risks, benefits and alternatives for the proposed anesthesia with the patient or authorized representative who has indicated his/her understanding and acceptance.  ? ?Dental Advisory Given ? ?Plan Discussed with: CRNA, Anesthesiologist and Surgeon ? ?Anesthesia Plan Comments: ( ? ?)  ? ? ? ? ? ?Anesthesia Quick Evaluation ? ?Anesthesia Physical ?Anesthesia Plan ? ?ASA:  ? ?Anesthesia Plan:   ? ?Post-op Pain Management:   ? ?Induction:  ? ?PONV Risk Score and Plan:  ? ?Airway Management Planned:  ? ?Additional Equipment:  ? ?Intra-op Plan:  ? ?Post-operative Plan:  ? ?Informed Consent:  ? ?Plan Discussed with:  ? ?Anesthesia Plan Comments: (See PAT note 02/13/2022, Lyndon Code, PA-C)  ? ? ? ? ? ?Anesthesia Quick Evaluation ? ?

## 2022-02-18 NOTE — H&P (Signed)
HIP ARTHROPLASTY ADMISSION H&P ? ?Patient ID: ?Jeremy [name] ?MRN: [id number] ?DOB/AGE: 02-28-1951 71 y.o. ? ?Chief Complaint: right hip pain. ? ?Planned Procedure Date: 02/26/22 ?Medical Clearance by Dr. Redmond School   ?Cardiac Clearance by Dr. Danelle Earthly ?Pulmonary clearance by Dr. Melvyn Novas ?Oncology clearance by Dr. Delton Coombes  ? ?HPI: ?Jeremy [name] is a 71 y.o. male who presents for evaluation of OSTEOARTHRITIS  RIGHT HIP. The patient has a history of pain and functional disability in the right hip due to arthritis and has failed non-surgical conservative treatments for greater than 12 weeks to include NSAID's and/or analgesics, use of assistive devices, and activity modification.  Onset of symptoms was gradual, starting 4 years ago with gradually worsening course since that time. The patient noted no past surgery on the right hip.  Patient currently rates pain at 9 out of 10 with activity. Patient has night pain, worsening of pain with activity and weight bearing, and pain that interferes with activities of daily living.  Patient has evidence of subchondral sclerosis, periarticular osteophytes, joint space narrowing, and osteolysis with superior migration of the femoral head in relation to the acetabulum  by imaging studies.  There is no active infection. ? ?Past Medical History:  ?Diagnosis Date  ? Alcohol use   ? daily  ? Anxiety   ? hx (05/05/2018)  ? Arthritis   ? "all over" (05/05/2018)  ? Asthma   ? Chronic bronchitis ([place])   ? COPD (chronic obstructive pulmonary disease) ([place])   ? Dyspnea   ? Family history of adverse reaction to anesthesia   ? "mom would have nausea after OR"   ? GERD (gastroesophageal reflux disease)   ? History of hiatal hernia   ? Hypertension   ? Pneumonia X 1  ? PONV (postoperative nausea and vomiting)   ? Seasonal allergies   ? "spring, summer, fall"  ? Sleep apnea   ? ?Past Surgical History:  ?Procedure Laterality Date  ? ANTERIOR CERVICAL DECOMP/DISCECTOMY FUSION Left   ?  BACK SURGERY    ? COLONOSCOPY    ? JOINT REPLACEMENT    ? LAPAROSCOPIC CHOLECYSTECTOMY    ? LUMBAR DISC SURGERY  X 2  ? L4-5  ? NASAL POLYP EXCISION  ~ 1970  ? TONSILLECTOMY    ? TOTAL HIP ARTHROPLASTY Left 05/05/2018  ? TOTAL HIP ARTHROPLASTY Left 05/05/2018  ? Procedure: LEFT TOTAL HIP ARTHROPLASTY ANTERIOR APPROACH;  Surgeon: [name], MD;  Location: [place];  Service: Orthopedics;  Laterality: Left;  ? ?Allergies  ?Allergen Reactions  ? Penicillins Other (See Comments)  ?  Has patient had a PCN reaction causing immediate rash, facial/tongue/throat swelling, SOB or lightheadedness with hypotension:##Yes# but pt states he is ok with amoxicillin. ?Has patient had a PCN reaction causing severe rash involving mucus membranes or skin necrosis: No ?Has patient had a PCN reaction that required hospitalization: No ?Has patient had a PCN reaction occurring within the last 10 years: No ?If all of the above answers are "NO", then may proceed with Cephalosporin use. ? ?  ? Tape Other (See Comments)  ?  Skin tears and bruises-use paper tape  ? ?Prior to Admission medications   ?Medication Sig Start Date End Date Taking? Authorizing Provider  ?acetaminophen (TYLENOL) 500 MG tablet Take 1,000 mg by mouth every 8 (eight) hours as needed for moderate pain.   Yes [provider]  ?ALPRAZolam (XANAX) 0.5 MG tablet Take 0.25-0.5 mg by mouth 3 (three) times daily as needed  for anxiety.   Yes [provider]  ?amLODipine (NORVASC) 10 MG tablet Take 10 mg by mouth daily. 07/28/17  Yes [provider]  ?aspirin EC 81 MG tablet Take 1 tablet (81 mg total) by mouth 2 (two) times daily. For DVT prophylaxis ?Patient taking differently: Take 81 mg by mouth daily. For DVT prophylaxis 05/05/18  Yes Prudencio Burly III, PA-C  ?atorvastatin (LIPITOR) 40 MG tablet Take 1 tablet (40 mg total) by mouth daily. 12/31/21 03/31/22 Yes O'Neal, Cassie Freer, MD  ?Cholecalciferol (VITAMIN D) 50 MCG (2000 UT) CAPS Take  4,000 Units by mouth daily.   Yes [provider]  ?cyanocobalamin (,VITAMIN B-12,) 1000 MCG/ML injection Inject 1,000 mcg into the muscle every 30 (thirty) days.   Yes [provider]  ?diltiazem (CARDIZEM CD) 360 MG 24 hr capsule Take 360 mg by mouth daily. 02/06/22  Yes [provider]  ?diphenoxylate-atropine (LOMOTIL) 2.5-0.025 MG tablet Take 1 tablet by mouth 4 (four) times daily as needed for diarrhea or loose stools.   Yes [provider]  ?Fluticasone-Umeclidin-Vilant Viviana Simpler ELLIPTA) 100-62.5-25 MCG/ACT AEPB One first thing am 11/02/21  Yes Tanda Rockers, MD  ?Glucosamine HCl (GLUCOSAMINE PO) Take 2 tablets by mouth daily.   Yes [provider]  ?hydrochlorothiazide (MICROZIDE) 12.5 MG capsule Take 12.5 mg by mouth as needed. 02/06/22  Yes [provider]  ?meloxicam (MOBIC) 7.5 MG tablet Take 7.5 mg by mouth daily. 07/08/17  Yes [provider]  ?olmesartan (BENICAR) 40 MG tablet Take 1 tablet (40 mg total) by mouth daily. 08/20/21  Yes Tanda Rockers, MD  ?pantoprazole (PROTONIX) 40 MG tablet Take 40 mg by mouth 2 (two) times daily. 07/10/21  Yes [provider]  ?potassium chloride SA (KLOR-CON M) 20 MEQ tablet Take 20 mEq by mouth as needed. 02/06/22  Yes [provider]  ?TURMERIC PO Take 1 capsule by mouth daily.   Yes [provider]  ?VENTOLIN HFA 108 (90 Base) MCG/ACT inhaler Inhale 1-2 puffs into the lungs every 6 (six) hours as needed for wheezing or shortness of breath. 07/04/17  Yes [provider]  ?vitamin E 180 MG (400 UNITS) capsule Take 400 Units by mouth daily.   Yes [provider]  ? ?Social History  ? ?Socioeconomic History  ? Marital status: Married  ?  Spouse name: Not on file  ? Number of children: 2  ? Years of education: Not on file  ? Highest education level: Not on file  ?Occupational History  ? Occupation: Retired  ? Occupation: retired  ?Tobacco Use  ? Smoking status: Former  ?   Packs/day: 1.00  ?  Years: 60.00  ?  Pack years: 60.00  ?  Types: Cigarettes, Pipe  ?  Quit date: 06/13/2021  ?  Years since quitting: 0.6  ? Smokeless tobacco: Never  ?Vaping Use  ? Vaping Use: Every day  ?Substance and Sexual Activity  ? Alcohol use: Yes  ?  Alcohol/week: 3.0 standard drinks  ?  Types: 3 Shots of liquor per week  ?  Comment: 3 drinks per day  ? Drug use: Never  ? Sexual activity: Not Currently  ?Other Topics Concern  ? Not on file  ?Social History Narrative  ? Not on file  ? ?Social Determinants of Health  ? ?Financial Resource Strain: Low Risk   ? Difficulty of Paying Living Expenses: Not hard at all  ?Food Insecurity: No Food Insecurity  ? Worried About Estate manager/land agent  of Food in the Last Year: Never true  ? Ran Out of Food in the Last Year: Never true  ?Transportation Needs: No Transportation Needs  ? Lack of Transportation (Medical): No  ? Lack of Transportation (Non-Medical): No  ?Physical Activity: Inactive  ? Days of Exercise per Week: 0 days  ? Minutes of Exercise per Session: 0 min  ?Stress: No Stress Concern Present  ? Feeling of Stress : Not at all  ?Social Connections: Moderately Integrated  ? Frequency of Communication with Friends and Family: More than three times a week  ? Frequency of Social Gatherings with Friends and Family: More than three times a week  ? Attends Religious Services: Never  ? Active Member of Clubs or Organizations: Yes  ? Attends Archivist Meetings: More than 4 times per year  ? Marital Status: Married  ? ?Family History  ?Problem Relation Age of Onset  ? Heart disease Mother   ? Heart disease Father   ? ? ?ROS: Currently denies lightheadedness, dizziness, Fever, chills, CP, SOB.   ?No personal history of DVT, PE, MI, or CVA. ?No loose teeth or dentures ?All other systems have been reviewed and were otherwise currently negative with the exception of those mentioned in the HPI and as above. ? ?Objective: ?Vitals: Ht: 5'9" Wt: 197.8 lbs Temp: 97.5 BP: 173/80  Pulse: 62 O2 97% on room air.   ?Physical Exam: ?General: Alert, NAD. Trendelenberg Gait  ?HEENT: EOMI, Good Neck Extension  ?Pulm: No increased work of breathing. Diminished breath sounds in the bilat

## 2022-02-18 NOTE — Progress Notes (Signed)
Anesthesia Chart Review ? ? Case: [id number] Date/Time: 02/26/22 0715  ? Procedure: TOTAL HIP ARTHROPLASTY ANTERIOR APPROACH (Right: Hip)  ? Anesthesia type: Choice  ? Pre-op diagnosis: OSTEOARTHRITIS  RIGHT HIP  ? Location: [facility]OM 08 / [facility]  ? Surgeons: [name], MD  ? ?  ? ? ?DISCUSSION:70 y.o. former smoker with h/o PONV, HTN, GERD, COPD, OSA, right hip OA scheduled for above procedure 02/26/2022 with Dr. Edmonia Lynch.  ? ?Pt seen by cardiology 12/31/2021. Per OV note, "He presents for preoperative assessment.  His RCRI equals 0 which equates to a 0.4% (very low risk) risk of major perioperative cardiovascular event.  This does not account for his moderate severe COPD.  He also is very inactive and cannot complete 4 METS.  He has a recent CT of his chest which demonstrates extensive coronary calcifications including left main calcifications.  He reports no chest tightness.  He does report shortness of breath.  Given his poor functional status I have recommended a stress test prior to surgery.  He had an echocardiogram in July of last year which was normal.  I have also recommend he start an aspirin 81 mg daily regimen.  I have also recommended he start Lipitor 40 mg daily.  We will need to obtain his primary care physician cholesterol results.  He does need to have his LDL cholesterol less than 70.  EKG in July of this year was normal.  He really reports no change in symptoms other than shortness of breath which I suspect is COPD related.  I have recommended he reach out to his pulmonologist regarding clearance from a pulmonary standpoint.  I believe his bigger issue likely will end up being his lungs.  He will see [place] yearly.  I will notify Dr. Percell Miller of the results of his stress test." ? ?Low risk stress test 01/09/2022.  ? ?Per Dr. Melvyn Novas, "Yes cleared for surgery." ? ?Anticipate pt can proceed with planned procedure barring acute status change.   ?VS: BP (!) 143/73   Pulse 80   Temp 36.7 ?C (Oral)    Resp 18   Ht 5\' 9"  (1.753 m)   Wt 88.9 kg   SpO2 97%   BMI 28.94 kg/m?  ? ?PROVIDERS: ?Redmond School, MD is PCP  ? ?Geralynn Rile, MD is Cardiologist ? ?Christinia Gully, MD is Pulmonologist  ?LABS: Labs reviewed: Acceptable for surgery. ?(all labs ordered are listed, but only abnormal results are displayed) ? ?Labs Reviewed  ?CBC - Abnormal; Notable for the following components:  ?    Result Value  ? MCH 34.4 (*)   ? All other components within normal limits  ?BASIC METABOLIC PANEL - Abnormal; Notable for the following components:  ? Sodium 131 (*)   ? Chloride 95 (*)   ? Glucose, Bld 106 (*)   ? All other components within normal limits  ?SURGICAL PCR SCREEN  ?TYPE AND SCREEN  ? ? ? ?IMAGES: ? ? ?EKG: ?06/15/2021 ?Rate 89 bpm  ?Sinus rhythm  ? ?CV: ?Myocardial Perfusion 01/09/2022 ?  The study is normal. The study is low risk. ?  No ST deviation was noted. ?  LV perfusion is normal. There is no evidence of ischemia. There is no evidence of infarction. ?  Left ventricular function is normal. End diastolic cavity size is normal. End systolic cavity size is normal. ?  ?Normal resting and stress perfusion. No ischemia or infarction EF ?55% ? ?Echo 06/16/2021 ?1. Left ventricular ejection  fraction, by estimation, is 60 to 65%. The  ?left ventricle has normal function. The left ventricle has no regional  ?wall motion abnormalities. Left ventricular diastolic parameters were  ?normal.  ? 2. Right ventricular systolic function is normal. The right ventricular  ?size is normal. Tricuspid regurgitation signal is inadequate for assessing  ?PA pressure.  ? 3. The mitral valve is normal in structure. No evidence of mitral valve  ?regurgitation. No evidence of mitral stenosis.  ? 4. The aortic valve is normal in structure. Aortic valve regurgitation is  ?not visualized. No aortic stenosis is present.  ? 5. The inferior vena cava is dilated in size with >50% respiratory  ?variability, suggesting right atrial pressure of 8  mmHg.  ?Past Medical History:  ?Diagnosis Date  ? Alcohol use   ? daily  ? Anxiety   ? hx (05/05/2018)  ? Arthritis   ? "all over" (05/05/2018)  ? Asthma   ? Chronic bronchitis ([place])   ? COPD (chronic obstructive pulmonary disease) ([place])   ? Dyspnea   ? Family history of adverse reaction to anesthesia   ? "mom would have nausea after OR"   ? GERD (gastroesophageal reflux disease)   ? History of hiatal hernia   ? Hypertension   ? Pneumonia X 1  ? PONV (postoperative nausea and vomiting)   ? Seasonal allergies   ? "spring, summer, fall"  ? Sleep apnea   ? ? ?Past Surgical History:  ?Procedure Laterality Date  ? ANTERIOR CERVICAL DECOMP/DISCECTOMY FUSION Left   ? BACK SURGERY    ? COLONOSCOPY    ? JOINT REPLACEMENT    ? LAPAROSCOPIC CHOLECYSTECTOMY    ? LUMBAR DISC SURGERY  X 2  ? L4-5  ? NASAL POLYP EXCISION  ~ 1970  ? TONSILLECTOMY    ? TOTAL HIP ARTHROPLASTY Left 05/05/2018  ? TOTAL HIP ARTHROPLASTY Left 05/05/2018  ? Procedure: LEFT TOTAL HIP ARTHROPLASTY ANTERIOR APPROACH;  Surgeon: [name], MD;  Location: [place];  Service: Orthopedics;  Laterality: Left;  ? ? ?MEDICATIONS: ? acetaminophen (TYLENOL) 500 MG tablet  ? ALPRAZolam (XANAX) 0.5 MG tablet  ? amLODipine (NORVASC) 10 MG tablet  ? aspirin EC 81 MG tablet  ? atorvastatin (LIPITOR) 40 MG tablet  ? Cholecalciferol (VITAMIN D) 50 MCG (2000 UT) CAPS  ? cyanocobalamin (,VITAMIN B-12,) 1000 MCG/ML injection  ? diltiazem (CARDIZEM CD) 360 MG 24 hr capsule  ? diphenoxylate-atropine (LOMOTIL) 2.5-0.025 MG tablet  ? Fluticasone-Umeclidin-Vilant (TRELEGY ELLIPTA) 100-62.5-25 MCG/ACT AEPB  ? Glucosamine HCl (GLUCOSAMINE PO)  ? hydrochlorothiazide (MICROZIDE) 12.5 MG capsule  ? meloxicam (MOBIC) 7.5 MG tablet  ? olmesartan (BENICAR) 40 MG tablet  ? pantoprazole (PROTONIX) 40 MG tablet  ? potassium chloride SA (KLOR-CON M) 20 MEQ tablet  ? TURMERIC PO  ? VENTOLIN HFA 108 (90 Base) MCG/ACT inhaler  ? vitamin E 180 MG (400 UNITS) capsule  ? ?No current  facility-administered medications for this encounter.  ? ? ?Konrad Felix Ward, PA-C ?[facility] ?[phone] ? ? ? ? ? ? ?

## 2022-02-25 NOTE — Anesthesia Preprocedure Evaluation (Addendum)
Anesthesia Evaluation  ?Patient identified by MRN, date of birth, ID band ?Patient awake ? ? ? ?Reviewed: ?Allergy & Precautions, H&P , NPO status , Patient's Chart, lab work & pertinent test results ? ?History of Anesthesia Complications ?(+) PONV and history of anesthetic complications ? ?Airway ?Mallampati: II ? ?TM Distance: >3 FB ?Neck ROM: Full ? ? ? Dental ?no notable dental hx. ?(+) Teeth Intact, Missing, Dental Advisory Given ?  ?Pulmonary ?neg pulmonary ROS, COPD,  COPD inhaler, Patient abstained from smoking., former smoker,  ?  ?Pulmonary exam normal ?breath sounds clear to auscultation ? ? ? ? ? ? Cardiovascular ?Exercise Tolerance: Good ?hypertension, Pt. on medications ?negative cardio ROS ?Normal cardiovascular exam ?Rhythm:Regular Rate:Normal ? ?Myocardial Perfusion 01/09/2022 ??The study is normal. The study is low risk. ?No ST deviation was noted. ??LV perfusion is normal. There is no evidence of ischemia. There is no evidence of infarction. ?Left ventricular function is normal. End diastolic cavity size is normal. End systolic cavity size is normal. ??Normal resting and stress perfusion. No ischemia or infarction EF ?55% ?? ?Echo 06/16/2021 ?1. Left ventricular ejection fraction, by estimation, is 60 to 65%. The  ?left ventricle has normal function. The left ventricle has no regional  ?wall motion abnormalities. Left ventricular diastolic parameters were  ?normal.  ??2. Right ventricular systolic function is normal. The right ventricular  ?size is normal. Tricuspid regurgitation signal is inadequate for assessing  ?PA pressure.  ??3. The mitral valve is normal in structure. No evidence of mitral valve  ?regurgitation. No evidence of mitral stenosis.  ??4. The aortic valve is normal in structure. Aortic valve regurgitation is  ?not visualized. No aortic stenosis is present.  ??5. The inferior vena cava is dilated in size with >50% respiratory  ?variability,  suggesting right atrial pressure of 8 mmHg.  ?  ?Neuro/Psych ?negative neurological ROS ? negative psych ROS  ? GI/Hepatic ?negative GI ROS, Neg liver ROS, hiatal hernia, GERD  Medicated,  ?Endo/Other  ?negative endocrine ROS ? Renal/GU ?negative Renal ROS  ?negative genitourinary ?  ?Musculoskeletal ?negative musculoskeletal ROS ?(+)  ? Abdominal ?  ?Peds ?negative pediatric ROS ?(+)  Hematology ?negative hematology ROS ?(+)   ?Anesthesia Other Findings ? ? Reproductive/Obstetrics ?negative OB ROS ? ?  ? ? ? ? ? ? ? ? ? ? ? ? ? ?  ?  ? ? ? ? ? ? ? ?Anesthesia Physical ?Anesthesia Plan ? ?ASA: 3 ? ?Anesthesia Plan: MAC and Spinal  ? ?Post-op Pain Management: Minimal or no pain anticipated  ? ?Induction:  ? ?PONV Risk Score and Plan: 2 and Propofol infusion ? ?Airway Management Planned: Natural Airway and Nasal Cannula ? ?Additional Equipment: None ? ?Intra-op Plan:  ? ?Post-operative Plan:  ? ?Informed Consent: I have reviewed the patients History and Physical, chart, labs and discussed the procedure including the risks, benefits and alternatives for the proposed anesthesia with the patient or authorized representative who has indicated his/her understanding and acceptance.  ? ? ? ? ? ?Plan Discussed with: CRNA and Anesthesiologist ? ?Anesthesia Plan Comments: (Pt seen by cardiology 12/31/2021. Per OV note, "He presents for preoperative assessment.  His RCRI equals 0 which equates to a 0.4% (very low risk) risk of major perioperative cardiovascular event.  This does not account for his moderate severe COPD.  He also is very inactive and cannot complete 4 METS.  He has a recent CT of his chest which demonstrates extensive coronary calcifications including left main calcifications.  He reports  no chest tightness.  He does report shortness of breath.  Given his poor functional status I have recommended a stress test prior to surgery.  He had an echocardiogram in July of last year which was normal. EKG in July of this  year was normal.  He really reports no change in symptoms other than shortness of breath which I suspect is COPD related.  I have recommended he reach out to his pulmonologist regarding clearance from a pulmonary standpoint.  I believe his bigger issue likely will end up being his lungs.  He will see [place] yearly.  I will notify Dr. Percell Miller of the results of his stress test.")  ? ? ? ? ? ? ?Anesthesia Quick Evaluation ? ?

## 2022-02-26 ENCOUNTER — Ambulatory Visit (HOSPITAL_BASED_OUTPATIENT_CLINIC_OR_DEPARTMENT_OTHER): Payer: Medicare HMO | Admitting: Anesthesiology

## 2022-02-26 ENCOUNTER — Encounter (HOSPITAL_COMMUNITY)
Admission: RE | Disposition: A | Discharge status: Home / Self Care | Payer: Self-pay | Source: Ambulatory Visit | Attending: Orthopedic Surgery

## 2022-02-26 ENCOUNTER — Other Ambulatory Visit: Payer: Self-pay

## 2022-02-26 ENCOUNTER — Ambulatory Visit (HOSPITAL_COMMUNITY): Payer: Medicare HMO | Admitting: Physician Assistant

## 2022-02-26 ENCOUNTER — Observation Stay (HOSPITAL_COMMUNITY)
Admission: RE | Admit: 2022-02-26 | Discharge: 2022-02-27 | Disposition: A | Discharge status: Home / Self Care | Payer: Medicare HMO | Source: Ambulatory Visit | Attending: Orthopedic Surgery | Admitting: Orthopedic Surgery

## 2022-02-26 ENCOUNTER — Encounter (HOSPITAL_COMMUNITY): Payer: Self-pay | Admitting: Orthopedic Surgery

## 2022-02-26 ENCOUNTER — Ambulatory Visit (HOSPITAL_COMMUNITY): Payer: Medicare HMO

## 2022-02-26 DIAGNOSIS — K449 Diaphragmatic hernia without obstruction or gangrene: Secondary | ICD-10-CM | POA: Diagnosis not present

## 2022-02-26 DIAGNOSIS — Z79899 Other long term (current) drug therapy: Secondary | ICD-10-CM | POA: Insufficient documentation

## 2022-02-26 DIAGNOSIS — Z7982 Long term (current) use of aspirin: Secondary | ICD-10-CM | POA: Diagnosis not present

## 2022-02-26 DIAGNOSIS — Z96641 Presence of right artificial hip joint: Secondary | ICD-10-CM | POA: Diagnosis present

## 2022-02-26 DIAGNOSIS — Z87891 Personal history of nicotine dependence: Secondary | ICD-10-CM | POA: Insufficient documentation

## 2022-02-26 DIAGNOSIS — J45909 Unspecified asthma, uncomplicated: Secondary | ICD-10-CM | POA: Insufficient documentation

## 2022-02-26 DIAGNOSIS — I10 Essential (primary) hypertension: Secondary | ICD-10-CM | POA: Insufficient documentation

## 2022-02-26 DIAGNOSIS — M1611 Unilateral primary osteoarthritis, right hip: Principal | ICD-10-CM | POA: Insufficient documentation

## 2022-02-26 DIAGNOSIS — J449 Chronic obstructive pulmonary disease, unspecified: Secondary | ICD-10-CM | POA: Insufficient documentation

## 2022-02-26 DIAGNOSIS — Z96642 Presence of left artificial hip joint: Secondary | ICD-10-CM | POA: Insufficient documentation

## 2022-02-26 HISTORY — PX: TOTAL HIP ARTHROPLASTY: SHX124

## 2022-02-26 LAB — CBC
HCT: 45.4 % (ref 39.0–52.0)
Hemoglobin: 15.8 g/dL (ref 13.0–17.0)
MCH: 35.3 pg — ABNORMAL HIGH (ref 26.0–34.0)
MCHC: 34.8 g/dL (ref 30.0–36.0)
MCV: 101.3 fL — ABNORMAL HIGH (ref 80.0–100.0)
Platelets: 302 10*3/uL (ref 150–400)
RBC: 4.48 MIL/uL (ref 4.22–5.81)
RDW: 12.1 % (ref 11.5–15.5)
WBC: 14.7 10*3/uL — ABNORMAL HIGH (ref 4.0–10.5)
nRBC: 0 % (ref 0.0–0.2)

## 2022-02-26 LAB — CREATININE, SERUM
Creatinine, Ser: 0.77 mg/dL (ref 0.61–1.24)
GFR, Estimated: >60 mL/min (ref >60)

## 2022-02-26 LAB — ABO/RH: ABO/RH(D): O POS

## 2022-02-26 SURGERY — ARTHROPLASTY, HIP, TOTAL, ANTERIOR APPROACH
Anesthesia: Monitor Anesthesia Care | Site: Hip | Laterality: Right

## 2022-02-26 MED ORDER — ACETAMINOPHEN 325 MG PO TABS: 325.0000 mg | ORAL_TABLET | Freq: Four times a day (QID) | ORAL | Status: DC | PRN

## 2022-02-26 MED ORDER — PHENYLEPHRINE HCL (PRESSORS) 10 MG/ML IV SOLN
INTRAVENOUS | Status: AC
  Filled 2022-02-26: qty 1

## 2022-02-26 MED ORDER — EPHEDRINE 5 MG/ML INJ
INTRAVENOUS | Status: AC
  Filled 2022-02-26: qty 5

## 2022-02-26 MED ORDER — TRANEXAMIC ACID-NACL 1000-0.7 MG/100ML-% IV SOLN
1000.0000 mg | Freq: Once | INTRAVENOUS | Status: AC
  Administered 2022-02-26: 1000 mg via INTRAVENOUS
  Filled 2022-02-26: qty 100

## 2022-02-26 MED ORDER — DEXAMETHASONE SODIUM PHOSPHATE 10 MG/ML IJ SOLN
8.0000 mg | Freq: Once | INTRAMUSCULAR | Status: AC
  Administered 2022-02-26: 8 mg via INTRAVENOUS

## 2022-02-26 MED ORDER — DEXAMETHASONE SODIUM PHOSPHATE 10 MG/ML IJ SOLN
INTRAMUSCULAR | Status: AC
  Filled 2022-02-26: qty 1

## 2022-02-26 MED ORDER — DIPHENHYDRAMINE HCL 12.5 MG/5ML PO ELIX: 12.5000 mg | ORAL_SOLUTION | ORAL | Status: DC | PRN

## 2022-02-26 MED ORDER — METOCLOPRAMIDE HCL 5 MG PO TABS: 5.0000 mg | ORAL_TABLET | Freq: Three times a day (TID) | ORAL | Status: DC | PRN

## 2022-02-26 MED ORDER — PANTOPRAZOLE SODIUM 40 MG PO TBEC
40.0000 mg | DELAYED_RELEASE_TABLET | Freq: Every day | ORAL | Status: DC
  Administered 2022-02-26 – 2022-02-27 (×2): 40 mg via ORAL
  Filled 2022-02-26 (×2): qty 1

## 2022-02-26 MED ORDER — FLUTICASONE FUROATE-VILANTEROL 100-25 MCG/ACT IN AEPB
1.0000 | INHALATION_SPRAY | Freq: Every day | RESPIRATORY_TRACT | Status: DC
  Administered 2022-02-27: 1 via RESPIRATORY_TRACT
  Filled 2022-02-26: qty 28

## 2022-02-26 MED ORDER — PROPOFOL 500 MG/50ML IV EMUL
INTRAVENOUS | Status: DC | PRN
  Administered 2022-02-26: 85 ug/kg/min via INTRAVENOUS

## 2022-02-26 MED ORDER — VANCOMYCIN HCL IN DEXTROSE 1-5 GM/200ML-% IV SOLN
1000.0000 mg | Freq: Two times a day (BID) | INTRAVENOUS | Status: AC
  Administered 2022-02-26: 1000 mg via INTRAVENOUS
  Filled 2022-02-26: qty 200

## 2022-02-26 MED ORDER — ONDANSETRON HCL 4 MG/2ML IJ SOLN
INTRAMUSCULAR | Status: DC | PRN
  Administered 2022-02-26: 4 mg via INTRAVENOUS

## 2022-02-26 MED ORDER — POVIDONE-IODINE 10 % EX SWAB: 2.0000 "application " | Freq: Once | CUTANEOUS | Status: DC

## 2022-02-26 MED ORDER — PHENYLEPHRINE HCL-NACL 20-0.9 MG/250ML-% IV SOLN
INTRAVENOUS | Status: DC | PRN
  Administered 2022-02-26: 25 ug/min via INTRAVENOUS

## 2022-02-26 MED ORDER — ACETAMINOPHEN 325 MG PO TABS: 325.0000 mg | ORAL_TABLET | ORAL | Status: DC | PRN

## 2022-02-26 MED ORDER — POVIDONE-IODINE 10 % EX SWAB
2.0000 "application " | Freq: Once | CUTANEOUS | Status: AC
  Administered 2022-02-26: 2 via TOPICAL

## 2022-02-26 MED ORDER — WATER FOR IRRIGATION, STERILE IR SOLN
Status: DC | PRN
  Administered 2022-02-26: 2000 mL

## 2022-02-26 MED ORDER — DEXAMETHASONE SODIUM PHOSPHATE 10 MG/ML IJ SOLN
10.0000 mg | Freq: Once | INTRAMUSCULAR | Status: AC
  Administered 2022-02-27: 10 mg via INTRAVENOUS
  Filled 2022-02-26: qty 1

## 2022-02-26 MED ORDER — THIAMINE HCL 100 MG PO TABS
100.0000 mg | ORAL_TABLET | Freq: Every day | ORAL | Status: DC
  Administered 2022-02-26 – 2022-02-27 (×2): 100 mg via ORAL
  Filled 2022-02-26 (×2): qty 1

## 2022-02-26 MED ORDER — ADULT MULTIVITAMIN W/MINERALS CH
1.0000 | ORAL_TABLET | Freq: Every day | ORAL | Status: DC
  Administered 2022-02-26 – 2022-02-27 (×2): 1 via ORAL
  Filled 2022-02-26 (×2): qty 1

## 2022-02-26 MED ORDER — PHENOL 1.4 % MT LIQD: 1.0000 | OROMUCOSAL | Status: DC | PRN

## 2022-02-26 MED ORDER — ONDANSETRON HCL 4 MG/2ML IJ SOLN: 4.0000 mg | Freq: Once | INTRAMUSCULAR | Status: DC | PRN

## 2022-02-26 MED ORDER — ALBUTEROL SULFATE (2.5 MG/3ML) 0.083% IN NEBU
3.0000 mL | INHALATION_SOLUTION | Freq: Four times a day (QID) | RESPIRATORY_TRACT | Status: DC | PRN
  Administered 2022-02-27: 3 mL via RESPIRATORY_TRACT

## 2022-02-26 MED ORDER — ATORVASTATIN CALCIUM 40 MG PO TABS
40.0000 mg | ORAL_TABLET | Freq: Every day | ORAL | Status: DC
  Administered 2022-02-27: 40 mg via ORAL
  Filled 2022-02-26: qty 1

## 2022-02-26 MED ORDER — MEPERIDINE HCL 50 MG/ML IJ SOLN: 6.2500 mg | INTRAMUSCULAR | Status: DC | PRN

## 2022-02-26 MED ORDER — METHOCARBAMOL 500 MG PO TABS
500.0000 mg | ORAL_TABLET | Freq: Four times a day (QID) | ORAL | Status: DC | PRN
  Administered 2022-02-26: 500 mg via ORAL
  Filled 2022-02-26: qty 1

## 2022-02-26 MED ORDER — BUPIVACAINE LIPOSOME 1.3 % IJ SUSP
INTRAMUSCULAR | Status: AC
  Filled 2022-02-26: qty 10

## 2022-02-26 MED ORDER — PROPOFOL 10 MG/ML IV BOLUS
INTRAVENOUS | Status: AC
  Filled 2022-02-26: qty 20

## 2022-02-26 MED ORDER — ONDANSETRON HCL 4 MG PO TABS: 4.0000 mg | ORAL_TABLET | Freq: Four times a day (QID) | ORAL | Status: DC | PRN

## 2022-02-26 MED ORDER — LACTATED RINGERS IV SOLN
INTRAVENOUS | Status: DC
Start: 2022-02-26 — End: 2022-02-26

## 2022-02-26 MED ORDER — MENTHOL 3 MG MT LOZG: 1.0000 | LOZENGE | OROMUCOSAL | Status: DC | PRN

## 2022-02-26 MED ORDER — OXYCODONE HCL 5 MG PO TABS
10.0000 mg | ORAL_TABLET | ORAL | Status: DC | PRN
  Administered 2022-02-26 – 2022-02-27 (×2): 10 mg via ORAL
  Filled 2022-02-26 (×3): qty 2

## 2022-02-26 MED ORDER — FLUTICASONE FUROATE-VILANTEROL 100-25 MCG/ACT IN AEPB
1.0000 | INHALATION_SPRAY | Freq: Every day | RESPIRATORY_TRACT | Status: DC
  Filled 2022-02-26: qty 28

## 2022-02-26 MED ORDER — ENOXAPARIN SODIUM 40 MG/0.4ML IJ SOSY
40.0000 mg | PREFILLED_SYRINGE | INTRAMUSCULAR | Status: DC
  Administered 2022-02-27: 40 mg via SUBCUTANEOUS
  Filled 2022-02-26: qty 0.4

## 2022-02-26 MED ORDER — OXYCODONE HCL 5 MG/5ML PO SOLN: 5.0000 mg | Freq: Once | ORAL | Status: DC | PRN

## 2022-02-26 MED ORDER — AMLODIPINE BESYLATE 10 MG PO TABS
10.0000 mg | ORAL_TABLET | Freq: Every day | ORAL | Status: DC
Start: 2022-02-27 — End: 2022-02-27
  Administered 2022-02-27: 10 mg via ORAL
  Filled 2022-02-26: qty 1

## 2022-02-26 MED ORDER — METHOCARBAMOL 500 MG IVPB - SIMPLE MED
500.0000 mg | Freq: Four times a day (QID) | INTRAVENOUS | Status: DC | PRN
  Filled 2022-02-26: qty 50

## 2022-02-26 MED ORDER — ALUM & MAG HYDROXIDE-SIMETH 200-200-20 MG/5ML PO SUSP: 30.0000 mL | ORAL | Status: DC | PRN

## 2022-02-26 MED ORDER — TRANEXAMIC ACID-NACL 1000-0.7 MG/100ML-% IV SOLN
1000.0000 mg | INTRAVENOUS | Status: AC
  Administered 2022-02-26: 1000 mg via INTRAVENOUS
  Filled 2022-02-26: qty 100

## 2022-02-26 MED ORDER — BUPIVACAINE LIPOSOME 1.3 % IJ SUSP
INTRAMUSCULAR | Status: DC | PRN
  Administered 2022-02-26: 10 mL

## 2022-02-26 MED ORDER — CHLORHEXIDINE GLUCONATE 0.12 % MT SOLN
15.0000 mL | Freq: Once | OROMUCOSAL | Status: AC
  Administered 2022-02-26: 15 mL via OROMUCOSAL

## 2022-02-26 MED ORDER — DILTIAZEM HCL ER COATED BEADS 180 MG PO CP24
360.0000 mg | ORAL_CAPSULE | Freq: Every day | ORAL | Status: DC
  Administered 2022-02-27: 360 mg via ORAL
  Filled 2022-02-26: qty 2

## 2022-02-26 MED ORDER — THIAMINE HCL 100 MG/ML IJ SOLN: 100.0000 mg | Freq: Every day | INTRAMUSCULAR | Status: DC

## 2022-02-26 MED ORDER — LORAZEPAM 2 MG/ML IJ SOLN: 1.0000 mg | INTRAMUSCULAR | Status: DC | PRN

## 2022-02-26 MED ORDER — MIDAZOLAM HCL 2 MG/2ML IJ SOLN
INTRAMUSCULAR | Status: AC
  Filled 2022-02-26: qty 2

## 2022-02-26 MED ORDER — HYDROMORPHONE HCL 1 MG/ML IJ SOLN: 0.5000 mg | INTRAMUSCULAR | Status: DC | PRN

## 2022-02-26 MED ORDER — POLYETHYLENE GLYCOL 3350 17 G PO PACK: 17.0000 g | PACK | Freq: Every day | ORAL | Status: DC | PRN

## 2022-02-26 MED ORDER — MIDAZOLAM HCL 2 MG/2ML IJ SOLN
INTRAMUSCULAR | Status: DC | PRN
  Administered 2022-02-26: 2 mg via INTRAVENOUS

## 2022-02-26 MED ORDER — UMECLIDINIUM BROMIDE 62.5 MCG/ACT IN AEPB
1.0000 | INHALATION_SPRAY | Freq: Every day | RESPIRATORY_TRACT | Status: DC
  Administered 2022-02-27: 1 via RESPIRATORY_TRACT
  Filled 2022-02-26: qty 7

## 2022-02-26 MED ORDER — ACETAMINOPHEN 160 MG/5ML PO SOLN: 325.0000 mg | ORAL | Status: DC | PRN

## 2022-02-26 MED ORDER — ORAL CARE MOUTH RINSE: 15.0000 mL | Freq: Once | OROMUCOSAL | Status: AC

## 2022-02-26 MED ORDER — METOCLOPRAMIDE HCL 5 MG/ML IJ SOLN: 5.0000 mg | Freq: Three times a day (TID) | INTRAMUSCULAR | Status: DC | PRN

## 2022-02-26 MED ORDER — ALPRAZOLAM 0.25 MG PO TABS: 0.2500 mg | ORAL_TABLET | Freq: Three times a day (TID) | ORAL | Status: DC | PRN

## 2022-02-26 MED ORDER — BUPIVACAINE IN DEXTROSE 0.75-8.25 % IT SOLN
INTRATHECAL | Status: DC | PRN
  Administered 2022-02-26: 2 mL via INTRATHECAL

## 2022-02-26 MED ORDER — PROPOFOL 10 MG/ML IV BOLUS
INTRAVENOUS | Status: DC | PRN
  Administered 2022-02-26 (×2): 20 mg via INTRAVENOUS

## 2022-02-26 MED ORDER — 0.9 % SODIUM CHLORIDE (POUR BTL) OPTIME
TOPICAL | Status: DC | PRN
Start: 2022-02-26 — End: 2022-02-26
  Administered 2022-02-26: 1000 mL

## 2022-02-26 MED ORDER — FOLIC ACID 1 MG PO TABS
1.0000 mg | ORAL_TABLET | Freq: Every day | ORAL | Status: DC
  Administered 2022-02-26 – 2022-02-27 (×2): 1 mg via ORAL
  Filled 2022-02-26 (×2): qty 1

## 2022-02-26 MED ORDER — UMECLIDINIUM BROMIDE 62.5 MCG/ACT IN AEPB
1.0000 | INHALATION_SPRAY | Freq: Every day | RESPIRATORY_TRACT | Status: DC
  Filled 2022-02-26: qty 7

## 2022-02-26 MED ORDER — PROPOFOL 1000 MG/100ML IV EMUL
INTRAVENOUS | Status: AC
  Filled 2022-02-26: qty 100

## 2022-02-26 MED ORDER — SODIUM CHLORIDE FLUSH 0.9 % IV SOLN
INTRAVENOUS | Status: DC | PRN
  Administered 2022-02-26: 10 mL

## 2022-02-26 MED ORDER — SODIUM CHLORIDE (PF) 0.9 % IJ SOLN
INTRAMUSCULAR | Status: AC
  Filled 2022-02-26: qty 10

## 2022-02-26 MED ORDER — IRBESARTAN 150 MG PO TABS
300.0000 mg | ORAL_TABLET | Freq: Every day | ORAL | Status: DC
  Administered 2022-02-26 – 2022-02-27 (×2): 300 mg via ORAL
  Filled 2022-02-26 (×2): qty 2

## 2022-02-26 MED ORDER — FENTANYL CITRATE PF 50 MCG/ML IJ SOSY: 25.0000 ug | PREFILLED_SYRINGE | INTRAMUSCULAR | Status: DC | PRN

## 2022-02-26 MED ORDER — LORAZEPAM 1 MG PO TABS
1.0000 mg | ORAL_TABLET | ORAL | Status: DC | PRN
  Administered 2022-02-26: 1 mg via ORAL
  Filled 2022-02-26: qty 1

## 2022-02-26 MED ORDER — DOCUSATE SODIUM 100 MG PO CAPS
100.0000 mg | ORAL_CAPSULE | Freq: Two times a day (BID) | ORAL | Status: DC
  Administered 2022-02-26 – 2022-02-27 (×2): 100 mg via ORAL
  Filled 2022-02-26 (×2): qty 1

## 2022-02-26 MED ORDER — VANCOMYCIN HCL 1500 MG/300ML IV SOLN
1500.0000 mg | INTRAVENOUS | Status: AC
  Administered 2022-02-26: 1500 mg via INTRAVENOUS
  Filled 2022-02-26: qty 300

## 2022-02-26 MED ORDER — EPHEDRINE 5 MG/ML INJ
INTRAVENOUS | Status: AC
Start: 2022-02-26 — End: ?
  Filled 2022-02-26: qty 5

## 2022-02-26 MED ORDER — OXYCODONE HCL 5 MG PO TABS
5.0000 mg | ORAL_TABLET | ORAL | Status: DC | PRN
  Administered 2022-02-26: 10 mg via ORAL

## 2022-02-26 MED ORDER — TRAMADOL HCL 50 MG PO TABS
50.0000 mg | ORAL_TABLET | Freq: Four times a day (QID) | ORAL | Status: DC
  Administered 2022-02-26 – 2022-02-27 (×5): 50 mg via ORAL
  Filled 2022-02-26 (×5): qty 1

## 2022-02-26 MED ORDER — ONDANSETRON HCL 4 MG/2ML IJ SOLN: 4.0000 mg | Freq: Four times a day (QID) | INTRAMUSCULAR | Status: DC | PRN

## 2022-02-26 MED ORDER — BISACODYL 10 MG RE SUPP: 10.0000 mg | Freq: Every day | RECTAL | Status: DC | PRN

## 2022-02-26 MED ORDER — ACETAMINOPHEN 500 MG PO TABS
1000.0000 mg | ORAL_TABLET | Freq: Four times a day (QID) | ORAL | Status: AC
  Administered 2022-02-26 – 2022-02-27 (×4): 1000 mg via ORAL
  Filled 2022-02-26 (×4): qty 2

## 2022-02-26 MED ORDER — ONDANSETRON HCL 4 MG/2ML IJ SOLN
INTRAMUSCULAR | Status: AC
  Filled 2022-02-26: qty 2

## 2022-02-26 MED ORDER — BUPIVACAINE LIPOSOME 1.3 % IJ SUSP: 10.0000 mL | Freq: Once | INTRAMUSCULAR | Status: DC

## 2022-02-26 MED ORDER — ACETAMINOPHEN 500 MG PO TABS
1000.0000 mg | ORAL_TABLET | Freq: Once | ORAL | Status: DC
  Filled 2022-02-26: qty 2

## 2022-02-26 MED ORDER — EPHEDRINE SULFATE-NACL 50-0.9 MG/10ML-% IV SOSY
PREFILLED_SYRINGE | INTRAVENOUS | Status: DC | PRN
  Administered 2022-02-26 (×2): 5 mg via INTRAVENOUS

## 2022-02-26 MED ORDER — OXYCODONE HCL 5 MG PO TABS: 5.0000 mg | ORAL_TABLET | Freq: Once | ORAL | Status: DC | PRN

## 2022-02-26 SURGICAL SUPPLY — 47 items
APL PRP STRL LF DISP 70% ISPRP (MISCELLANEOUS) ×1
BAG COUNTER SPONGE SURGICOUNT (BAG) IMPLANT
BAG SPEC THK2 15X12 ZIP CLS (MISCELLANEOUS)
BAG SPNG CNTER NS LX DISP (BAG)
BAG ZIPLOCK 12X15 (MISCELLANEOUS) IMPLANT
BLADE SAG 18X100X1.27 (BLADE) ×2 IMPLANT
BLADE SURG SZ10 CARB STEEL (BLADE) ×2 IMPLANT
CHLORAPREP W/TINT 26 (MISCELLANEOUS) ×2 IMPLANT
CLSR STERI-STRIP ANTIMIC 1/2X4 (GAUZE/BANDAGES/DRESSINGS) ×2 IMPLANT
COVER PERINEAL POST (MISCELLANEOUS) ×2 IMPLANT
COVER SURGICAL LIGHT HANDLE (MISCELLANEOUS) ×2 IMPLANT
DRAPE IMP U-DRAPE 54X76 (DRAPES) ×2 IMPLANT
DRAPE STERI IOBAN 125X83 (DRAPES) ×2 IMPLANT
DRAPE U-SHAPE 47X51 STRL (DRAPES) ×4 IMPLANT
DRSG MEPILEX BORDER 4X8 (GAUZE/BANDAGES/DRESSINGS) ×2 IMPLANT
ELECT REM PT RETURN 15FT ADLT (MISCELLANEOUS) ×2 IMPLANT
GLOVE SRG 8 PF TXTR STRL LF DI (GLOVE) ×1 IMPLANT
GLOVE SURG ENC MOIS LTX SZ7.5 (GLOVE) ×2 IMPLANT
GLOVE SURG POLYISO LF SZ7.5 (GLOVE) ×2 IMPLANT
GLOVE SURG SYN 7.5  E (GLOVE) ×2
GLOVE SURG SYN 7.5 E (GLOVE) ×1 IMPLANT
GLOVE SURG SYN 7.5 PF PI (GLOVE) ×1 IMPLANT
GLOVE SURG UNDER POLY LF SZ7.5 (GLOVE) ×2 IMPLANT
GLOVE SURG UNDER POLY LF SZ8 (GLOVE) ×2
GOWN STRL REUS W/ TWL LRG LVL3 (GOWN DISPOSABLE) ×1 IMPLANT
GOWN STRL REUS W/TWL LRG LVL3 (GOWN DISPOSABLE) ×2
HEAD CERAMIC FEMORAL 36MM (Head) ×1 IMPLANT
HOLDER FOLEY CATH W/STRAP (MISCELLANEOUS) IMPLANT
INSERT TRIDENT POLY 36 0DEG (Insert) ×1 IMPLANT
KIT TURNOVER KIT A (KITS) IMPLANT
MANIFOLD NEPTUNE II (INSTRUMENTS) ×2 IMPLANT
NS IRRIG 1000ML POUR BTL (IV SOLUTION) ×2 IMPLANT
PACK ANTERIOR HIP CUSTOM (KITS) ×2 IMPLANT
PROTECTOR NERVE ULNAR (MISCELLANEOUS) ×2 IMPLANT
SCREW HEX LP 6.5X20 (Screw) ×1 IMPLANT
SHELL ACETABUL CLUSTER SZ 54 (Shell) ×1 IMPLANT
SPIKE FLUID TRANSFER (MISCELLANEOUS) ×4 IMPLANT
SPONGE T-LAP 18X18 ~~LOC~~+RFID (SPONGE) ×6 IMPLANT
STEM ACCOLADE SZ 6 (Hips) ×1 IMPLANT
SUT MNCRL AB 3-0 PS2 18 (SUTURE) ×2 IMPLANT
SUT VIC AB 0 CT1 36 (SUTURE) ×2 IMPLANT
SUT VIC AB 1 CT1 36 (SUTURE) ×2 IMPLANT
SUT VIC AB 2-0 CT1 27 (SUTURE) ×4
SUT VIC AB 2-0 CT1 TAPERPNT 27 (SUTURE) ×2 IMPLANT
TRAY FOLEY MTR SLVR 16FR STAT (SET/KITS/TRAYS/PACK) IMPLANT
TUBE SUCTION HIGH CAP CLEAR NV (SUCTIONS) ×2 IMPLANT
WATER STERILE IRR 1000ML POUR (IV SOLUTION) ×4 IMPLANT

## 2022-02-26 NOTE — Interval H&P Note (Signed)
History and Physical Interval Note: ? ?02/26/2022 ?7:11 AM ? ?Jeremy [name]  has presented today for surgery, with the diagnosis of OSTEOARTHRITIS  RIGHT HIP.  The various methods of treatment have been discussed with the patient and family. After consideration of risks, benefits and other options for treatment, the patient has consented to  Procedure(s): ?TOTAL HIP ARTHROPLASTY ANTERIOR APPROACH (Right) as a surgical intervention.  The patient's history has been reviewed, patient examined, no change in status, stable for surgery.  I have reviewed the patient's chart and labs.  Questions were answered to the patient's satisfaction.   ? ? ?[name] ? ? ?

## 2022-02-26 NOTE — Plan of Care (Signed)
?  Problem: Education: ?Goal: Knowledge of the prescribed therapeutic regimen will improve ?Outcome: Progressing ?  ?Problem: Activity: ?Goal: Ability to avoid complications of mobility impairment will improve ?Outcome: Progressing ?  ?Problem: Pain Management: ?Goal: Pain level will decrease with appropriate interventions ?02/26/2022 2240 by Abagail Kitchens, RN ?Outcome: Progressing ?02/26/2022 2240 by Abagail Kitchens, RN ?Outcome: Progressing ?  ?Problem: Elimination: ?Goal: Will not experience complications related to urinary retention ?Outcome: Progressing ?  ?Problem: Safety: ?Goal: Ability to remain free from injury will improve ?Outcome: Progressing ?  ?

## 2022-02-26 NOTE — Anesthesia Postprocedure Evaluation (Signed)
Anesthesia Post Note ? ?Patient: Jeremy [name] ? ?Procedure(s) Performed: TOTAL HIP ARTHROPLASTY ANTERIOR APPROACH (Right: Hip) ? ?  ? ?Patient location during evaluation: PACU ?Anesthesia Type: MAC ?Level of consciousness: awake and alert ?Pain management: pain level controlled ?Vital Signs Assessment: post-procedure vital signs reviewed and stable ?Respiratory status: spontaneous breathing, nonlabored ventilation, respiratory function stable and patient connected to nasal cannula oxygen ?Cardiovascular status: stable and blood pressure returned to baseline ?Postop Assessment: no apparent nausea or vomiting ?Anesthetic complications: no ? ? ?No notable events documented. ? ?Last Vitals:  ?Vitals:  ? 02/26/22 1130 02/26/22 1210  ?BP: 121/67 139/70  ?Pulse: 62 66  ?Resp: 15 18  ?Temp:  36.4 ?C  ?SpO2: 100% 95%  ?  ?Last Pain:  ?Vitals:  ? 02/26/22 1210  ?TempSrc: Oral  ?PainSc: 0-No pain  ? ? ?  ?  ?  ?  ?  ?  ? ?Syrai Gladwin ? ? ? ? ?

## 2022-02-26 NOTE — Anesthesia Procedure Notes (Signed)
Spinal ? ?Patient location during procedure: OR ?Start time: 02/26/2022 7:32 AM ?End time: 02/26/2022 7:35 AM ?Reason for block: surgical anesthesia ?Staffing ?Anesthesiologist: Janeece Riggers, MD ?Preanesthetic Checklist ?Completed: patient identified, IV checked, site marked, risks and benefits discussed, surgical consent, monitors and equipment checked, pre-op evaluation and timeout performed ?Spinal Block ?Patient position: sitting ?Prep: DuraPrep ?Patient monitoring: heart rate, cardiac monitor, continuous pulse ox and blood pressure ?Approach: midline ?Location: L4-5 ?Injection technique: single-shot ?Needle ?Needle type: Sprotte  ?Needle gauge: 24 G ?Needle length: 9 cm ?Assessment ?Sensory level: T4 ?Events: CSF return ? ? ? ?

## 2022-02-26 NOTE — Evaluation (Signed)
Physical Therapy Evaluation ?Patient Details ?Name: Jeremy [name] ?MRN: [id number] ?DOB: Oct 26, 1951 ?Today's Date: 02/26/2022 ? ?History of Present Illness ? Patient is 71 y.o. male s/p Rt THA anterior approach on 02/26/22 with PMH significant for ETOH abuse, anxiety, OA, asthma, COPD, GERD, HTN, ACDF, LT THA in 2019. ? ?  ?Clinical Impression ? Jeremy [name] is a 71 y.o. male POD 0 s/p Rt THA. Patient reports modified independence with [facility] for mobility at baseline. Patient is now limited by functional impairments (see PT problem list below) and requires min assist for transfers and gait with RW. Patient was able to ambulate 20' feet with RW and min assist. Patient instructed in exercise to facilitate ROM and circulation to manage edema. Throughout session pt noted to have slight tremor and coordination impaired in bil UE with finger to nose. Pt has significant ETOH abuse history and reports he has not had any alcohol for ~48 hours. RN notified of concern for withdrawal. Patient will benefit from continued skilled PT interventions to address impairments and progress towards PLOF. Acute PT will follow to progress mobility and stair training in preparation for safe discharge home.    ?   ? ?Recommendations for follow up therapy are one component of a multi-disciplinary discharge planning process, led by the attending physician.  Recommendations may be updated based on patient status, additional functional criteria and insurance authorization. ? ?Follow Up Recommendations Follow physician's recommendations for discharge plan and follow up therapies ? ?  ?Assistance Recommended at Discharge Frequent or constant Supervision/Assistance  ?Patient can return home with the following ? A little help with walking and/or transfers;A little help with bathing/dressing/bathroom;Assistance with cooking/housework;Direct supervision/assist for medications management;Direct supervision/assist for financial management;Help with  stairs or ramp for entrance;Assist for transportation ? ?  ?Equipment Recommendations None recommended by PT  ?Recommendations for Other Services ?    ?  ?Functional Status Assessment Patient has had a recent decline in their functional status and demonstrates the ability to make significant improvements in function in a reasonable and predictable amount of time.  ? ?  ?Precautions / Restrictions Precautions ?Precautions: Fall ?Precaution Comments: at least 2 falls in last couple months ?Restrictions ?Weight Bearing Restrictions: No ?RLE Weight Bearing: Weight bearing as tolerated  ? ?  ? ?Mobility ? Bed Mobility ?Overal bed mobility: Needs Assistance ?Bed Mobility: Supine to Sit ?  ?  ?Supine to sit: Min assist, HOB elevated ?  ?  ?General bed mobility comments: cues to sequence and pt using bed rail to pivot to EOB. assist to bring Rt LE off EOB fully. ?  ? ?Transfers ?Overall transfer level: Needs assistance ?Equipment used: Rolling walker (2 wheels) ?Transfers: Sit to/from Stand ?Sit to Stand: Min assist ?  ?  ?  ?  ?  ?General transfer comment: min assist to rise from EOB with cues for safe hand placement for power up. ?  ? ?Ambulation/Gait ?Ambulation/Gait assistance: Min assist ?Gait Distance (Feet): 70 Feet ?Assistive device: Rolling walker (2 wheels) ?Gait Pattern/deviations: Step-to pattern, Decreased stride length, Decreased weight shift to right ?Gait velocity: decr ?  ?  ?General Gait Details: cues for step to pattern and proximity to RW, assist to steady and manage walker position. pt had slight tremor during mobility. ? ?Stairs ?  ?  ?  ?  ?  ? ?Wheelchair Mobility ?  ? ?Modified Rankin (Stroke Patients Only) ?  ? ?  ? ?Balance Overall balance assessment: Needs assistance ?Sitting-balance support: Feet supported ?Sitting balance-Leahy  Scale: Good ?  ?  ?Standing balance support: Bilateral upper extremity supported, During functional activity, Reliant on assistive device for balance ?Standing  balance-[name]: Poor ?  ?  ?  ?  ?  ?  ?  ?  ?  ?  ?  ?  ?   ? ? ? ?Pertinent Vitals/Pain Pain Assessment ?Pain Assessment: No/denies pain  ? ? ?Home Living Family/patient expects to be discharged to:: Private residence ?Living Arrangements: Spouse/significant other ?Available Help at Discharge: Family ?Type of Home: House ?Home Access: Stairs to enter ?Entrance Stairs-Rails: Left ?Entrance Stairs-Number of Steps: 2 ?  ?Home Layout: One level ?Home Equipment: Conservation officer, nature (2 wheels);Standard Walker;Cane - single point;Cane - quad;BSC/3in1;Toilet riser ?   ?  ?Prior Function Prior Level of Function : Independent/Modified Independent ?  ?  ?  ?  ?  ?  ?Mobility Comments: pt using QC for mobility due to hip giving out. ?ADLs Comments: pt's wife assists as needed with bathing for safety and dressing ?  ? ? ?Hand Dominance  ? Dominant Hand: Right ? ?  ?Extremity/Trunk Assessment  ? Upper Extremity Assessment ?Upper Extremity Assessment: Overall WFL for tasks assessed;RUE deficits/detail;LUE deficits/detail ?RUE Deficits / Details: finger to nose - poor trajectory with tremor noted ?RUE Coordination: decreased gross motor ?LUE Deficits / Details: finger to nose - poor trajectory with tremor noted ?LUE Coordination: decreased gross motor ?  ? ?Lower Extremity Assessment ?Lower Extremity Assessment: Overall WFL for tasks assessed ?  ? ?Cervical / Trunk Assessment ?Cervical / Trunk Assessment: Normal  ?Communication  ? Communication: HOH  ?Cognition Arousal/Alertness: Awake/alert ?Behavior During Therapy: [facility] for tasks assessed/performed ?Overall Cognitive Status: Within Functional Limits for tasks assessed ?  ?  ?  ?  ?  ?  ?  ?  ?  ?  ?  ?  ?  ?  ?  ?  ?General Comments: pt is HOH and wife assists with answering questions. pt reports he consumes 3-4 "shots of liquor"/day. pt's wife states it is more like 6-8 drinks per day. ?  ?  ? ?  ?General Comments   ? ?  ?Exercises Total Joint Exercises ?Ankle Circles/Pumps:  AROM, Both, 10 reps, Seated ?Quad Sets: AROM, Both, 5 reps, Seated ?Heel Slides: AROM, Right, 5 reps, Seated  ? ?Assessment/Plan  ?  ?PT Assessment Patient needs continued PT services  ?PT Problem List Decreased range of motion;Decreased strength ? ?   ?  ?PT Treatment Interventions DME instruction;Gait training;Stair training;Functional mobility training;Therapeutic activities;Therapeutic exercise;Balance training;Neuromuscular re-education;Patient/family education   ? ?PT Goals (Current goals can be found in the Care Plan section)  ?Acute Rehab PT Goals ?Patient Stated Goal: get home and rehab safely ?PT Goal Formulation: With patient/family ?Time For Goal Achievement: 03/05/22 ?Potential to Achieve Goals: Good ? ?  ?Frequency 7X/week ?  ? ? ?Co-evaluation   ?  ?  ?  ?  ? ? ?  ?AM-PAC PT "6 Clicks" Mobility  ?Outcome Measure Help needed turning from your back to your side while in a flat bed without using bedrails?: A Little ?Help needed moving from lying on your back to sitting on the side of a flat bed without using bedrails?: A Little ?Help needed moving to and from a bed to a chair (including a wheelchair)?: A Little ?Help needed standing up from a chair using your arms (e.g., wheelchair or bedside chair)?: A Little ?Help needed to walk in hospital room?: A Little ?Help needed climbing 3-5 steps  with a railing? : A Lot ?6 Click Score: 17 ? ?  ?End of Session Equipment Utilized During Treatment: Gait belt ?Activity Tolerance: Patient tolerated treatment well ?Patient left: in chair;with call bell/phone within reach;with chair alarm set;with family/visitor present ?Nurse Communication: Mobility status (concern for withdrawal and need for CIWA) ?PT Visit Diagnosis: Unsteadiness on feet (R26.81);Muscle weakness (generalized) (M62.81) ?  ? ?Time: 1345-1410 ?PT Time Calculation (min) (ACUTE ONLY): 25 min ? ? ?Charges:   PT Evaluation ?$PT Eval Low Complexity: 1 Low ?PT Treatments ?$Gait Training: 8-22 mins ?  ?    ? ? ?Gwynneth Albright PT, DPT ?Acute Rehabilitation Services ?Office [phone] ?Pager [phone]  ? ?Jacques Navy ?02/26/2022, 3:23 PM ? ?

## 2022-02-26 NOTE — Op Note (Signed)
02/26/2022 ? ?8:47 AM ? ?PATIENT:  MACE WEINBERG  ? ?MRN: [id number] ? ?PRE-OPERATIVE DIAGNOSIS:  OSTEOARTHRITIS  RIGHT HIP ? ?POST-OPERATIVE DIAGNOSIS:  OSTEOARTHRITIS  RIGHT HIP ? ?PROCEDURE:  Procedure(s): ?TOTAL HIP ARTHROPLASTY ANTERIOR APPROACH ? ?PREOPERATIVE INDICATIONS:   ? ?BURNS TIMSON is an 71 y.o. male who has a diagnosis of <principal problem not specified> and elected for surgical management after failing conservative treatment.  The risks benefits and alternatives were discussed with the patient including but not limited to the risks of nonoperative treatment, versus surgical intervention including infection, bleeding, nerve injury, periprosthetic fracture, the need for revision surgery, dislocation, leg length discrepancy, blood clots, cardiopulmonary complications, morbidity, mortality, among others, and they were willing to proceed.   ? ? ?OPERATIVE REPORT  ?   ?SURGEON:   [name], MD ?   ?ASSISTANT:  Aggie Moats, PA-C, he was present and scrubbed throughout the case, critical for completion in a timely fashion, and for retraction, instrumentation, and closure. ? ?   ?ANESTHESIA:  General ?   ?COMPLICATIONS:  None.  ?   ?COMPONENTS:  [employer]ker acolade fit femur size 6 with a 36 mm -0 head ball and an acetabular shell size 54 with a  polyethylene liner ?   ?PROCEDURE IN DETAIL:  ? ?The patient was met in the holding area and  identified.  The appropriate hip was identified and marked at the operative site.  The patient was then transported to the OR  and  placed under anesthesia per that record.  At that point, the patient was  placed in the supine position and  secured to the operating room table and all bony prominences padded. He received pre-operative antibiotics ?   ?The operative lower extremity was prepped from the iliac crest to the distal leg.  Sterile draping was performed.  Time out was performed prior to incision.   ?   ?Skin incision was made just 2 cm lateral to the  ASIS  extending in line with the tensor fascia lata. Electrocautery was used to control all bleeders. I dissected down sharply to the fascia of the tensor fascia lata was confirmed that the muscle fibers beneath were running posteriorly. I then incised the fascia over the superficial tensor fascia lata in line with the incision. The fascia was elevated off the anterior aspect of the muscle the muscle was retracted posteriorly and protected throughout the case. I then used electrocautery to incise the tensor fascia lata fascia control and all bleeders. Immediately visible was the fat over top of the anterior neck and capsule. ? ?I removed the anterior fat from the capsule and elevated the rectus muscle off of the anterior capsule. I then removed a large time of capsule. The retractors were then placed over the anterior acetabulum as well as around the superior and inferior neck. ? ?I then made a femoral neck cut. Then used the power corkscrew to remove the femoral head from the acetabulum and thoroughly irrigated the acetabulum. I sized the femoral head. ?   ?I then exposed the deep acetabulum, cleared out any tissue including the ligamentum teres.   After adequate visualization, I excised the labrum, and then sequentially reamed.  I then impacted the acetabular implant into place using fluoroscopy for guidance.  Appropriate version and inclination was confirmed clinically matching their bony anatomy, and with fluoroscopy. ? ?I placed a 20 mm screw in the posterior/superio position with an excellent bite.  ? ? I then placed the polyethylene  liner in place ? ?I then adducted the leg and released the external rotators from the posterior femur allowing it to be easily delivered up lateral and anterior to the acetabulum for preparation of the femoral canal. ?   ?I then prepared the proximal femur using the cookie-cutter and then sequentially reamed and broached. ? ?A trial broach, neck, and head was utilized, and I  reduced the hip and used floroscopy to assess the neck length and femoral implant. ? ?I then impacted the femoral prosthesis into place into the appropriate version. The hip was then reduced and fluoroscopy confirmed appropriate position. Leg lengths were restored. ? ?I then irrigated the hip copiously again with, and repaired the fascia with Vicryl, followed by monocryl for the subcutaneous tissue, Monocryl for the skin, Steri-Strips and sterile gauze. The patient was then awakened and returned to PACU in stable and satisfactory condition. There were no complications. ? ?POST OPERATIVE PLAN: WBAT, DVT px: SCD's/TED, ambulation and chemical dvt px ? ?Edmonia Lynch, MD ?Orthopedic Surgeon ?[phone]  ? ? ? ?

## 2022-02-26 NOTE — Discharge Instructions (Addendum)

## 2022-02-26 NOTE — Transfer of Care (Signed)
Immediate Anesthesia Transfer of Care Note ? ?Patient: Jeremy [name] ? ?Procedure(s) Performed: TOTAL HIP ARTHROPLASTY ANTERIOR APPROACH (Right: Hip) ? ?Patient Location: PACU ? ?Anesthesia Type:Spinal ? ?Level of Consciousness: drowsy ? ?Airway & Oxygen Therapy: Patient Spontanous Breathing and Patient connected to face mask oxygen ? ?Post-op Assessment: Report given to RN and Post -op Vital signs reviewed and stable ? ?Post vital signs: Reviewed and stable ? ?Last Vitals:  ?Vitals Value Taken Time  ?BP 103/56   ?Temp    ?Pulse 57   ?Resp 12 02/26/22 0919  ?SpO2 99   ?Vitals shown include unvalidated device data. ? ?Last Pain:  ?Vitals:  ? 02/26/22 0607  ?TempSrc:   ?PainSc: 0-No pain  ?   ? ?  ? ?Complications: No notable events documented. ?

## 2022-02-26 NOTE — Anesthesia Procedure Notes (Signed)
Procedure Name: [place] ?Date/Time: 02/26/2022 7:22 AM ?Performed by: Niel Hummer, CRNA ?Pre-anesthesia Checklist: Patient identified, Emergency Drugs available, Suction available and Patient being monitored ?Oxygen Delivery Method: Simple face mask ? ? ? ? ?

## 2022-02-27 ENCOUNTER — Encounter (HOSPITAL_COMMUNITY): Payer: Self-pay | Admitting: Orthopedic Surgery

## 2022-02-27 DIAGNOSIS — M1611 Unilateral primary osteoarthritis, right hip: Secondary | ICD-10-CM | POA: Diagnosis not present

## 2022-02-27 MED ORDER — ACETAMINOPHEN 500 MG PO TABS: 1000.0000 mg | ORAL_TABLET | Freq: Four times a day (QID) | ORAL | 0 refills | Status: DC | PRN

## 2022-02-27 MED ORDER — ONDANSETRON 4 MG PO TBDP: 4.0000 mg | ORAL_TABLET | Freq: Two times a day (BID) | ORAL | 0 refills | Status: DC | PRN

## 2022-02-27 MED ORDER — METHOCARBAMOL 750 MG PO TABS: 750.0000 mg | ORAL_TABLET | Freq: Three times a day (TID) | ORAL | 0 refills | Status: DC | PRN

## 2022-02-27 MED ORDER — OXYCODONE HCL 5 MG PO TABS: 5.0000 mg | ORAL_TABLET | Freq: Four times a day (QID) | ORAL | 0 refills | Status: DC | PRN

## 2022-02-27 MED ORDER — ASPIRIN EC 81 MG PO TBEC: 81.0000 mg | DELAYED_RELEASE_TABLET | Freq: Two times a day (BID) | ORAL | 0 refills | Status: AC

## 2022-02-27 NOTE — Plan of Care (Signed)
Discharge instructions given to he pt. Including medications.  ?

## 2022-02-27 NOTE — Progress Notes (Signed)
? ? ?Subjective: ?Patient reports pain as mild to moderate. Tolerating diet. Urinating. No CP, SOB.  Some coughing. Using IC. Has worked with PT on mobilizing OOB. Was having some tremors yesterday that were thought to be from EtOH withdrawal so placed on CIWA protocol. Feeling ok today. Hopeful to go home today. ? ?Objective:  ? ?VITALS:   ?Vitals:  ? 02/27/22 0537 02/27/22 0925 02/27/22 0926 02/27/22 0949  ?BP: (!) 153/98   131/71  ?Pulse: 89   89  ?Resp: 17   15  ?Temp: 97.8 ?F (36.6 ?C)   (!) 97.5 ?F (36.4 ?C)  ?TempSrc:      ?SpO2: 95% 95% 95% 92%  ?Weight:      ?Height:      ? ? ?  Latest Ref Rng & Units 02/26/2022  ? 12:40 PM 02/13/2022  ?  9:59 AM 10/22/2021  ?  9:45 AM  ?CBC  ?WBC 4.0 - 10.5 K/uL 14.7   9.0   10.1    ?Hemoglobin 13.0 - 17.0 g/dL 15.8   16.8   16.5    ?Hematocrit 39.0 - 52.0 % 45.4   48.1   46.7    ?Platelets 150 - 400 K/uL 302   341   268    ? ? ?  Latest Ref Rng & Units 02/26/2022  ? 12:40 PM 02/13/2022  ?  9:59 AM 10/22/2021  ?  9:45 AM  ?BMP  ?Glucose 70 - 99 mg/dL  106   111    ?BUN 8 - 23 mg/dL  10   7    ?Creatinine 0.61 - 1.24 mg/dL 0.77   0.85   0.71    ?Sodium 135 - 145 mmol/L  131   133    ?Potassium 3.5 - 5.1 mmol/L  3.6   4.2    ?Chloride 98 - 111 mmol/L  95   99    ?CO2 22 - 32 mmol/L  24   26    ?Calcium 8.9 - 10.3 mg/dL  9.3   8.9    ? ?Intake/Output   ?   03/28 0701 ?03/29 0700 03/29 0701 ?03/30 0700  ? P.O. 960   ? I.V. (mL/kg) 700 (7.9)   ? IV Piggyback 398.5   ? Total Intake(mL/kg) 2058.5 (23.2)   ? Urine (mL/kg/hr) 2125 (1)   ? Blood 50   ? Total Output 2175   ? Net -116.5   ?     ?  ? ? ?Physical Exam: ?General: NAD.  Sitting up in bed, coughing some, comfortable ?Resp: No increased wob ?Cardio: regular rate and rhythm ?ABD soft ?Neurologically intact ?MSK ?Neurovascularly intact ?Sensation intact distally ?Intact pulses distally ?Dorsiflexion/Plantar flexion intact ?Incision: dressing C/D/I ? ? ?Assessment: ?1 Day Post-Op  ?S/P Procedure(s) (LRB): ?TOTAL HIP ARTHROPLASTY  ANTERIOR APPROACH (Right) ?by Dr. Ernesta Amble. Murphy on 02/26/22 ? ?Principal Problem: ?  S/P total right hip arthroplasty ? ? ?Plan: ? Inhaler PRN  ?Advance diet ?Up with therapy ?Incentive Spirometry ?Elevate and Apply ice ? ?Weightbearing: WBAT RLE ?Insicional and dressing care: Dressings left intact until follow-up and Reinforce dressings as needed ?Orthopedic device(s): None ?Showering: Keep dressing dry ?VTE prophylaxis: Lovenox 40mg  qd  while inpatient, will switch to ASA 81mg  bid x 30 days on d/c , SCDs, ambulation ?Pain control: continue current regimen ?Follow - up plan: 2 weeks ?Contact information:  Edmonia Lynch MD, Aggie Moats PA-C ? ?Dispo: Home hopefully later today if passes PT. ? ? ? ? ?Doha Boling M  Madelon Lips, PA-C ?Office [phone] ?02/27/2022, 9:57 AM  ?

## 2022-02-27 NOTE — Discharge Summary (Signed)
Physician Discharge Summary  ?Patient ID: ?Jeremy [name] ?MRN: [id number] ?DOB/AGE: 73-Apr-1952 73 y.o. ? ?Admit date: 02/26/2022 ?Discharge date: 02/27/2022 ? ?Admission Diagnoses: right hip OA ? ?Discharge Diagnoses:  ?Principal Problem: ?  S/P total right hip arthroplasty ? ? ?Discharged Condition: fair ? ?Hospital Course: Patient underwent a right THA by Dr. Percell Miller on 73/73/71 without complications. He spent the night in observation for pain control and mobility improvement. He did start to show some symptoms of alcohol withdrawal yesterday after  surgery so he was placed on CIWA protocol. He has passed his PT evaluation and is ready for d/c home with OPPT already set up.  ? ?Consults: None ? ?Significant Diagnostic Studies: n/a ? ?Treatments: IV hydration, antibiotics: vancomycin, analgesia: acetaminophen, Vicodin, Dilaudid, and Oxycodone, anticoagulation: ASA, therapies: PT and OT, and surgery: right THA ? ?Discharge Exam: ?Blood pressure 131/71, pulse 89, temperature (!) 97.5 ?F (36.4 ?C), resp. rate 15, height 5\' 9"  (1.753 m), weight 88.9 kg, SpO2 92 %. ?General appearance: alert, cooperative, and no distress ?Head: Normocephalic, without obvious abnormality, atraumatic ?Eyes: conjunctivae/corneas clear. PERRL, EOM's intact. Fundi benign. ?Resp: clear to auscultation bilaterally ?Cardio: regular rate and rhythm, S1, S2 normal, no murmur, click, rub or gallop ?GI: soft, non-tender; bowel sounds normal; no masses,  no organomegaly ?Extremities: extremities normal, atraumatic, no cyanosis or edema ?Pulses:  ?L brachial 2+ R brachial 2+  ?L radial 2+ R radial 2+  ?L inguinal 2+ R inguinal 2+  ?L popliteal 2+ R popliteal 2+  ?L posterior tibial 2+ R posterior tibial 2+  ?L dorsalis pedis 2+ R dorsalis pedis 2+  ? ?Neurologic: Grossly normal ?Incision/Wound: c/d/i ? ?Disposition: Discharge disposition: 01-Home or Self Care ? ? ? ? ? ? ?Discharge Instructions   ? ? Call MD / Call 911   Complete by: As directed ?  ?  If you experience chest pain or shortness of breath, CALL 911 and be transported to the hospital emergency room.  If you develope a fever above 101 F, pus (white drainage) or increased drainage or redness at the wound, or calf pain, call your surgeon's office.  ? Diet - low sodium heart healthy   Complete by: As directed ?  ? Discharge instructions   Complete by: As directed ?  ? You may bear weight as tolerated. ?Keep your dressing on and dry until follow up. ?Take medicine to prevent blood clots as directed. ?Take pain medicine as needed with the goal of transitioning to over the counter medicines.  ? ? ?INSTRUCTIONS AFTER JOINT REPLACEMENT  ? ?Remove items at home which could result in a fall. This includes throw rugs or furniture in walking pathways ?ICE to the affected joint every three hours while awake for 30 minutes at a time, for at least the first 3-5 days, and then as needed for pain and swelling.  Continue to use ice for pain and swelling. You may notice swelling that will progress down to the foot and ankle.  This is normal after surgery.  Elevate your leg when you are not up walking on it.   ?Continue to use the breathing machine you got in the hospital (incentive spirometer) which will help keep your temperature down.  It is common for your temperature to cycle up and down following surgery, especially at night when you are not up moving around and exerting yourself.  The breathing machine keeps your lungs expanded and your temperature down. ? ? ?DIET:  As you were doing prior to  hospitalization, we recommend a well-balanced diet. ? ?DRESSING / WOUND CARE / SHOWERING ? ?You may shower 3 days after surgery, but keep the wounds dry during showering.  You may use an occlusive plastic wrap (Press'n Seal for example) with blue painter's tape at edges, NO SOAKING/SUBMERGING IN THE BATHTUB.  If the bandage gets wet, call the office.  ? ?ACTIVITY ? ?Increase activity slowly as tolerated, but follow the weight  bearing instructions below.   ?No driving for 6 weeks or until further direction given by your physician.  You cannot drive while taking narcotics.  ?No lifting or carrying greater than 10 lbs. until further directed by your surgeon. ?Avoid periods of inactivity such as sitting longer than an hour when not asleep. This helps prevent blood clots.  ?You may return to work once you are authorized by your doctor.  ? ? ?WEIGHT BEARING  ? ?Weight bearing as tolerated with assist device (walker, cane, etc) as directed, use it as long as suggested by your surgeon or therapist, typically at least 4-6 weeks. ? ? ?EXERCISES ? ?Results after joint replacement surgery are often greatly improved when you follow the exercise, range of motion and muscle strengthening exercises prescribed by your doctor. Safety measures are also important to protect the joint from further injury. Any time any of these exercises cause you to have increased pain or swelling, decrease what you are doing until you are comfortable again and then slowly increase them. If you have problems or questions, call your caregiver or physical therapist for advice.  ? ?Rehabilitation is important following a joint replacement. After just a few days of immobilization, the muscles of the leg can become weakened and shrink (atrophy).  These exercises are designed to build up the tone and strength of the thigh and leg muscles and to improve motion. Often times heat used for twenty to thirty minutes before working out will loosen up your tissues and help with improving the range of motion but do not use heat for the first two weeks following surgery (sometimes heat can increase post-operative swelling).  ? ?These exercises can be done on a training (exercise) mat, on the floor, on a table or on a bed. Use whatever works the best and is most comfortable for you.    Use music or television while you are exercising so that the exercises are a pleasant break in your day.  This will make your life better with the exercises acting as a break in your routine that you can look forward to.   Perform all exercises about fifteen times, three times per day or as directed.  You should exercise both the operative leg and the other leg as well. ? ?Exercises include: ?  ?Quad Sets - Tighten up the muscle on the front of the thigh (Quad) and hold for 5-10 seconds.   ?Straight Leg Raises - With your knee straight (if you were given a brace, keep it on), lift the leg to 60 degrees, hold for 3 seconds, and slowly lower the leg.  Perform this exercise against resistance later as your leg gets stronger.  ?Leg Slides: Lying on your back, slowly slide your foot toward your buttocks, bending your knee up off the floor (only go as far as is comfortable). Then slowly slide your foot back down until your leg is flat on the floor again.  ?Angel Wings: Lying on your back spread your legs to the side as far apart as you can without causing discomfort.  ?  Hamstring Strength:  Lying on your back, push your heel against the floor with your leg straight by tightening up the muscles of your buttocks.  Repeat, but this time bend your knee to a comfortable angle, and push your heel against the floor.  You may put a pillow under the heel to make it more comfortable if necessary.  ? ?A rehabilitation program following joint replacement surgery can speed recovery and prevent re-injury in the future due to weakened muscles. Contact your doctor or a physical therapist for more information on knee rehabilitation.  ? ? ?CONSTIPATION ? ?Constipation is defined medically as fewer than three stools per week and severe constipation as less than one stool per week.  Even if you have a regular bowel pattern at home, your normal regimen is likely to be disrupted due to multiple reasons following surgery.  Combination of anesthesia, postoperative narcotics, change in appetite and fluid intake all can affect your bowels.  ? ?YOU MUST  use at least one of the following options; they are listed in order of increasing strength to get the job done.  They are all available over the counter, and you may need to use some, POSSIBLY even all

## 2022-02-27 NOTE — Progress Notes (Signed)
Physical Therapy Treatment ?Patient Details ?Name: Jeremy [name] ?MRN: [id number] ?DOB: 02-20-1951 ?Today's Date: 02/27/2022 ? ? ?History of Present Illness Patient is 71 y.o. male s/p Rt THA anterior approach on 02/26/22 with PMH significant for ETOH abuse, anxiety, OA, asthma, COPD, GERD, HTN, ACDF, LT THA in 2019. ? ?  ?PT Comments  ? ? Patient making steady progress with mobility. He is noted to have increased tremors consistent with suspected ETOH withdrawal. Pt was able to ambulate ~160' with supervision from therapist and guarding/assist from spouse. Pt/spouse instructed on safe stair mobility in preparation for discharge home. Instructed on HEP for ROM, strength, and circulation. Educated pt/family on safety concerns regarding ETOH use and narcotic medication use and notified RN of questions/concerns of family. He is at safe level to return home with assist from family. Will progress as able during his stay. ? ?  ?Recommendations for follow up therapy are one component of a multi-disciplinary discharge planning process, led by the attending physician.  Recommendations may be updated based on patient status, additional functional criteria and insurance authorization. ? ?Follow Up Recommendations ? Follow physician's recommendations for discharge plan and follow up therapies ?  ?  ?Assistance Recommended at Discharge Frequent or constant Supervision/Assistance  ?Patient can return home with the following A little help with walking and/or transfers;A little help with bathing/dressing/bathroom;Assistance with cooking/housework;Direct supervision/assist for medications management;Direct supervision/assist for financial management;Help with stairs or ramp for entrance;Assist for transportation ?  ?Equipment Recommendations ? None recommended by PT  ?  ?Recommendations for Other Services   ? ? ?  ?Precautions / Restrictions Precautions ?Precautions: Fall ?Precaution Comments: at least 2 falls in last couple  months ?Restrictions ?Weight Bearing Restrictions: No ?RLE Weight Bearing: Weight bearing as tolerated  ?  ? ?Mobility ? Bed Mobility ?Overal bed mobility: Needs Assistance ?Bed Mobility: Supine to Sit ?  ?  ?Supine to sit: HOB elevated, Min guard ?  ?  ?General bed mobility comments: cues to initiate. pt required extra time and use of bed rail to sit up to EOB. ?  ? ?Transfers ?Overall transfer level: Needs assistance ?Equipment used: Rolling walker (2 wheels) ?Transfers: Sit to/from Stand ?Sit to Stand: Min guard ?  ?  ?  ?  ?  ?General transfer comment: guarding for safety with cues to power up from EOB and recliner. pt's wife able to guard safely with cues from therapist. ?  ? ?Ambulation/Gait ?Ambulation/Gait assistance: Min guard, Supervision ?Gait Distance (Feet): 160 Feet ?Assistive device: Rolling walker (2 wheels) ?Gait Pattern/deviations: Step-to pattern, Decreased stride length, Decreased weight shift to right ?Gait velocity: decr ?  ?  ?General Gait Details: pt with tremor during mobility and close min guard requried for safety. pt's wife provided guarding with cues and supervision from therapist. ? ? ?Stairs ?Stairs: Yes ?Stairs assistance: Min guard, Min assist ?Stair Management: One rail Left, Step to pattern, Sideways ?Number of Stairs: 2 ?General stair comments: VC's for sequencing side step and use of Lt rail. pt's wife provided assist for walker and safe guarding with cues/supervision from therapist. ? ? ?Wheelchair Mobility ?  ? ?Modified Rankin (Stroke Patients Only) ?  ? ? ?  ?Balance Overall balance assessment: Needs assistance ?Sitting-balance support: Feet supported ?Sitting balance-[name]: Good ?  ?  ?Standing balance support: Bilateral upper extremity supported, During functional activity, Reliant on assistive device for balance ?Standing balance-[name]: Poor ?  ?  ?  ?  ?  ?  ?  ?  ?  ?  ?  ?  ?  ? ?  ?  Cognition Arousal/Alertness: Awake/alert ?Behavior During Therapy: [facility] for  tasks assessed/performed ?Overall Cognitive Status: Within Functional Limits for tasks assessed ?  ?  ?  ?  ?  ?  ?  ?  ?  ?  ?  ?  ?  ?  ?  ?  ?  ?  ?  ? ?  ?Exercises Total Joint Exercises ?Ankle Circles/Pumps: AROM, Both, 10 reps, Seated ?Quad Sets: AROM, Both, Seated, 10 reps ?Heel Slides: AROM, Right, 5 reps, Seated ?Hip ABduction/ADduction: AROM, Both, 5 reps, Seated ? ?  ?General Comments   ?  ?  ? ?Pertinent Vitals/Pain Pain Assessment ?Pain Assessment: Faces ?Faces Pain Scale: Hurts a little bit ?Pain Location: Rt hip ?Pain Descriptors / Indicators: Aching ?Pain Intervention(s): Limited activity within patient's tolerance, Monitored during session, Repositioned  ? ? ?Home Living   ?  ?  ?  ?  ?  ?  ?  ?  ?  ?   ?  ?Prior Function    ?  ?  ?   ? ?PT Goals (current goals can now be found in the care plan section) Acute Rehab PT Goals ?Patient Stated Goal: get home and rehab safely ?PT Goal Formulation: With patient/family ?Time For Goal Achievement: 03/05/22 ?Potential to Achieve Goals: Good ?Progress towards PT goals: Progressing toward goals ? ?  ?Frequency ? ? ? 7X/week ? ? ? ?  ?PT Plan Current plan remains appropriate  ? ? ?Co-evaluation   ?  ?  ?  ?  ? ?  ?AM-PAC PT "6 Clicks" Mobility   ?Outcome Measure ? Help needed turning from your back to your side while in a flat bed without using bedrails?: A Little ?Help needed moving from lying on your back to sitting on the side of a flat bed without using bedrails?: A Little ?Help needed moving to and from a bed to a chair (including a wheelchair)?: A Little ?Help needed standing up from a chair using your arms (e.g., wheelchair or bedside chair)?: A Little ?Help needed to walk in hospital room?: A Little ?Help needed climbing 3-5 steps with a railing? : A Little ?6 Click Score: 18 ? ?  ?End of Session Equipment Utilized During Treatment: Gait belt ?Activity Tolerance: Patient tolerated treatment well ?Patient left: in chair;with call bell/phone within  reach;with chair alarm set;with family/visitor present ?Nurse Communication: Mobility status (concern for withdrawal and need for CIWA) ?PT Visit Diagnosis: Unsteadiness on feet (R26.81);Muscle weakness (generalized) (M62.81) ?  ? ? ?Time: 5784-6962 ?PT Time Calculation (min) (ACUTE ONLY): 38 min ? ?Charges:  $Gait Training: 23-37 mins ?$Therapeutic Exercise: 8-22 mins          ?          ? ?Gwynneth Albright PT, DPT ?Acute Rehabilitation Services ?Office [phone] ?Pager [phone]  ? ? ?Jacques Navy ?02/27/2022, 11:37 AM ? ?

## 2022-02-27 NOTE — TOC Transition Note (Signed)
Transition of Care (TOC) - CM/SW Discharge Note ? ? ?Patient Details  ?Name: Jeremy [name] ?MRN: [id number] ?Date of Birth: August 29, 1951 ? ?Transition of Care (TOC) CM/SW Contact:  ?Yomaira Solar, LCSW ?Phone Number: ?02/27/2022, 10:56 AM ? ? ?Clinical Narrative:    ?Met with pt and wife today who are hopeful for dc today and deny any concerns.  Confirming they have all needed DME at home and have OPPT already scheduled at [facility] in [place].  Have placed SA resource list on AVS due to concern for possible withdrawal following this surgery. ? ? ?Final next level of care: OP Rehab ?Barriers to Discharge: No Barriers Identified ? ? ?Patient Goals and CMS Choice ?Patient states their goals for this hospitalization and ongoing recovery are:: return home ?  ?  ? ?Discharge Placement ?  ?           ?  ?  ?  ?  ? ?Discharge Plan and Services ?  ?  ?           ?DME Arranged: N/A ?DME Agency: NA ?  ?  ?  ?  ?  ?  ?  ?  ? ?Social Determinants of Health (SDOH) Interventions ?  ? ? ?Readmission Risk Interventions ?   ? View : No data to display.  ?  ?  ?  ? ? ? ? ? ?

## 2022-02-27 NOTE — Plan of Care (Signed)
?  Problem: Activity: ?Goal: Ability to avoid complications of mobility impairment will improve ?Outcome: Progressing ?  ?Problem: Pain Management: ?Goal: Pain level will decrease with appropriate interventions ?Outcome: Progressing ?  ?Problem: Health Behavior/Discharge Planning: ?Goal: Ability to manage health-related needs will improve ?Outcome: Progressing ?  ?Problem: Clinical Measurements: ?Goal: Ability to maintain clinical measurements within normal limits will improve ?Outcome: Progressing ?  ?

## 2022-02-28 ENCOUNTER — Encounter (HOSPITAL_COMMUNITY): Payer: Self-pay | Admitting: Physical Therapy

## 2022-02-28 ENCOUNTER — Ambulatory Visit (HOSPITAL_COMMUNITY): Payer: Medicare HMO | Attending: Orthopedic Surgery | Admitting: Physical Therapy

## 2022-02-28 DIAGNOSIS — M25551 Pain in right hip: Secondary | ICD-10-CM | POA: Diagnosis present

## 2022-02-28 DIAGNOSIS — R2689 Other abnormalities of gait and mobility: Secondary | ICD-10-CM | POA: Insufficient documentation

## 2022-02-28 DIAGNOSIS — M25651 Stiffness of right hip, not elsewhere classified: Secondary | ICD-10-CM | POA: Diagnosis present

## 2022-02-28 NOTE — Therapy (Signed)
?OUTPATIENT PHYSICAL THERAPY LOWER EXTREMITY EVALUATION ? ? ?Patient Name: Jeremy [name] ?MRN: [id number] ?DOB:06-Sep-1951, 71 y.o., male ?Today's Date: 02/28/2022 ? ? PT End of Session - 02/28/22 1457   ? ? Visit Number 1   ? Number of Visits 12   ? Date for PT Re-Evaluation 04/11/22   ? Authorization Type [employer] Medicare HMO (no auth)   ? PT Start Time 1345   ? PT Stop Time 1430   ? PT Time Calculation (min) 45 min   ? Activity Tolerance Patient tolerated treatment well   ? Behavior During Therapy [facility] for tasks assessed/performed   ? ?  ?  ? ?  ? ? ?Past Medical History:  ?Diagnosis Date  ? Alcohol use   ? daily  ? Anxiety   ? hx (05/05/2018)  ? Arthritis   ? "all over" (05/05/2018)  ? Asthma   ? Chronic bronchitis ([place])   ? COPD (chronic obstructive pulmonary disease) ([place])   ? Dyspnea   ? Family history of adverse reaction to anesthesia   ? "mom would have nausea after OR"   ? GERD (gastroesophageal reflux disease)   ? History of hiatal hernia   ? Hypertension   ? Pneumonia X 1  ? PONV (postoperative nausea and vomiting)   ? Seasonal allergies   ? "spring, summer, fall"  ? Sleep apnea   ? ?Past Surgical History:  ?Procedure Laterality Date  ? ANTERIOR CERVICAL DECOMP/DISCECTOMY FUSION Left   ? BACK SURGERY    ? COLONOSCOPY    ? JOINT REPLACEMENT    ? LAPAROSCOPIC CHOLECYSTECTOMY    ? LUMBAR DISC SURGERY  X 2  ? L4-5  ? NASAL POLYP EXCISION  ~ 1970  ? TONSILLECTOMY    ? TOTAL HIP ARTHROPLASTY Left 05/05/2018  ? TOTAL HIP ARTHROPLASTY Left 05/05/2018  ? Procedure: LEFT TOTAL HIP ARTHROPLASTY ANTERIOR APPROACH;  Surgeon: [name], MD;  Location: [facility];  Service: Orthopedics;  Laterality: Left;  ? TOTAL HIP ARTHROPLASTY Right 02/26/2022  ? Procedure: TOTAL HIP ARTHROPLASTY ANTERIOR APPROACH;  Surgeon: [name], MD;  Location: [facility];  Service: Orthopedics;  Laterality: Right;  ? ?Patient Active Problem List  ? Diagnosis Date Noted  ? S/P total right hip arthroplasty 02/26/2022  ? Rhinitis, chronic  11/03/2021  ? COPD GOLD 3 08/20/2021  ? Lung nodule 07/02/2021  ? Acute hypoxemic respiratory failure ([place]) 06/14/2021  ? Community acquired pneumonia 06/14/2021  ? Primary osteoarthritis of hip 05/05/2018  ? Alcohol use 03/30/2018  ? Primary osteoarthritis of left hip 03/30/2018  ? Essential hypertension 03/30/2018  ? History of lumbar surgery 03/30/2018  ? History of cervical spinal surgery 03/30/2018  ? Tobacco abuse disorder 03/30/2018  ? ? ?PCP: Redmond School, MD ? ?REFERRING PROVIDER: [name], MD ? ?REFERRING DIAG: Right Total Hip Replacement  ? ?THERAPY DIAG:  ?Pain in right hip ? ?Stiffness of right hip, not elsewhere classified ? ?Other abnormalities of gait and mobility ? ?ONSET DATE: 02/27/22 ? ?SUBJECTIVE:  ? ?SUBJECTIVE STATEMENT: ?Patient presents to therapy with complaint of RT hip pain s/p RT THA on 02/26/22. He had his LT hip replaced about 3 years ago and was supposed to get his RT hip done but COVID hit and pushed this back. He has not been doing much walking in the last 2-3 years and he feels like he lost a lot of strength and stamina. He has history of COPD. He is walking with walker right now, he is  taking pain meds as prescribed.  ? ?PERTINENT HISTORY: ?RT THA 02/26/22, LT THA 3 years ago, COPD  ? ?PAIN:  ?Are you having pain? Yes: NPRS scale: 6/10 ?Pain location: RT hip ?Pain description: stiff, sore, aching  ?Aggravating factors: standing, walking, WB ?Relieving factors: rest, meds  ? ?PRECAUTIONS: Anterior hip ? ?WEIGHT BEARING RESTRICTIONS No ? ?FALLS:  ?Has patient fallen in last 6 months? Yes. Number of falls 3 ? ?LIVING ENVIRONMENT: ?Lives with: lives with their spouse ?Lives in: House/apartment ? ? ?OCCUPATION: Retired  ? ?PLOF: Independent with basic ADLs ? ?PATIENT GOALS Pain to go away and be able to walk ? ? ?OBJECTIVE:  ? ?DIAGNOSTIC FINDINGS: NA ? ?PATIENT SURVEYS:  ?FOTO 41% function  ? ?COGNITION: ? Overall cognitive status: Within functional limits for tasks  assessed   ?  ? ?LE ROM: AROM RT knee flexion to 90 degrees  ? ?Active ROM Right ?02/28/2022 Left ?02/28/2022   ?Hip flexion    ?Hip extension    ?Hip abduction    ?Hip adduction    ?Hip internal rotation    ?Hip external rotation    ?Knee flexion    ?Knee extension    ?Ankle dorsiflexion    ?Ankle plantarflexion    ?Ankle inversion    ?Ankle eversion    ? (Blank rows = not tested) ? ?LE MMT: ? ?MMT Right ?02/28/2022 Left ?02/28/2022  ?Hip flexion 3 4+  ?Hip extension    ?Hip abduction    ?Hip adduction    ?Hip internal rotation    ?Hip external rotation    ?Knee flexion    ?Knee extension 3+ 5  ?Ankle dorsiflexion 4+ 5  ?Ankle plantarflexion    ?Ankle inversion    ?Ankle eversion    ? (Blank rows = not tested) ? ? ?GAIT: ?Distance walked: 50 feet ?Assistive device utilized: Environmental consultant - 2 wheeled ?Level of assistance: Modified independence ?Comments: small step gait, antalgic, decreased stance time on RLE ? ?Observation:  ?Post op bandage intact, no visible drainage or discoloration  ? ?TODAY'S TREATMENT: ?02/28/22 ?Ankle pumps ?Quad set ?Glute set  ? ?PATIENT EDUCATION:  ?Education details: on evaluation findings, POC and HEP ?Person educated: Patient ?Education method: Explanation and Demonstration ?Education comprehension: verbalized understanding and returned demonstration ? ? ?HOME EXERCISE PROGRAM: ?Access Code: [email or web address] ?URL: [email or web address] ?Date: 02/28/2022 ?Prepared by: Josue Hector ? ?Exercises ?- Supine Ankle Pumps  - 3 x daily - 7 x weekly - 2 sets - 10 reps ?- Supine Gluteal Sets  - 3 x daily - 7 x weekly - 2 sets - 10 reps - 5 second hold ?- Supine Quad Set  - 3 x daily - 7 x weekly - 2 sets - 10 reps - 5 second hold ? ?ASSESSMENT: ? ?CLINICAL IMPRESSION: ?Patient is a 71 y.o. male who presents to physical therapy with complaint of RT THA on 02/26/22. Patient demonstrates muscle weakness, reduced ROM, decreased balance and altered gait mechanics which are likely contributing to symptoms  of pain and are negatively impacting patient ability to perform ADLs and functional mobility tasks. Patient will benefit from skilled physical therapy services to address these deficits to reduce pain and improve level of function with ADLs and functional mobility tasks. ? ? ? ?OBJECTIVE IMPAIRMENTS Abnormal gait, decreased activity tolerance, decreased balance, decreased endurance, decreased mobility, difficulty walking, decreased ROM, decreased strength, hypomobility, increased fascial restrictions, improper body mechanics, and pain.  ? ?ACTIVITY LIMITATIONS cleaning, community activity, driving, meal prep, laundry, yard work, shopping, and  yard work.  ? ?PERSONAL FACTORS Age and Past/current experiences are also affecting patient's functional outcome.  ? ? ?REHAB POTENTIAL: Good ? ?CLINICAL DECISION MAKING: Stable/uncomplicated ? ?EVALUATION COMPLEXITY: Low ? ? ?GOALS: ? ? ?SHORT TERM GOALS: Target date: 03/14/2022 ? ?Patient will be independent with initial HEP and self-management strategies to improve functional outcomes ?Baseline:  ?Goal status: INITIAL  ? ? ? ?LONG TERM GOALS: Target date: 04/11/2022 ? ?Patient will be independent with advanced HEP and self-management strategies to improve functional outcomes ?Baseline:  ?Goal status: INITIAL ? ?2.  Patient will improve FOTO score to predicted value to indicate improvement in functional outcomes ?Baseline:  ?Goal status: INITIAL ? ?3.  Patient will be able to ambulate at least 350 feet during 2MWT with LRAD to demonstrate improved ability to perform functional mobility and associated tasks. ?Baseline:  ?Goal status: INITIAL ? ?4. Patient will have equal to or > 4+/5 MMT throughout RLE to improve ability to perform functional mobility, stair ambulation and ADLs.  ?Baseline:  ?Goal status: INITIAL  ? ? ?PLAN: ?PT FREQUENCY: 2x/week ? ?PT DURATION: 6 weeks ? ?PLANNED INTERVENTIONS: Therapeutic exercises, Therapeutic activity, Neuromuscular re-education, Balance  training, Gait training, Patient/Family education, Joint manipulation, Joint mobilization, Stair training, Aquatic Therapy, Dry Needling, Electrical stimulation, Spinal manipulation, Spinal mobilization, Cryotherapy,

## 2022-03-01 ENCOUNTER — Other Ambulatory Visit: Payer: Self-pay

## 2022-03-01 ENCOUNTER — Emergency Department (HOSPITAL_COMMUNITY)
Admission: EM | Admit: 2022-03-01 | Discharge: 2022-03-01 | Disposition: A | Discharge status: Home / Self Care | Payer: Medicare HMO | Attending: Emergency Medicine | Admitting: Emergency Medicine

## 2022-03-01 ENCOUNTER — Encounter (HOSPITAL_COMMUNITY): Payer: Self-pay

## 2022-03-01 DIAGNOSIS — R112 Nausea with vomiting, unspecified: Secondary | ICD-10-CM | POA: Insufficient documentation

## 2022-03-01 DIAGNOSIS — E876 Hypokalemia: Secondary | ICD-10-CM | POA: Insufficient documentation

## 2022-03-01 DIAGNOSIS — D72829 Elevated white blood cell count, unspecified: Secondary | ICD-10-CM | POA: Insufficient documentation

## 2022-03-01 DIAGNOSIS — R509 Fever, unspecified: Secondary | ICD-10-CM | POA: Insufficient documentation

## 2022-03-01 DIAGNOSIS — Z96641 Presence of right artificial hip joint: Secondary | ICD-10-CM | POA: Insufficient documentation

## 2022-03-01 DIAGNOSIS — R197 Diarrhea, unspecified: Secondary | ICD-10-CM | POA: Insufficient documentation

## 2022-03-01 DIAGNOSIS — Z7982 Long term (current) use of aspirin: Secondary | ICD-10-CM | POA: Insufficient documentation

## 2022-03-01 LAB — COMPREHENSIVE METABOLIC PANEL
ALT: 53 U/L — ABNORMAL HIGH (ref 0–44)
AST: 85 U/L — ABNORMAL HIGH (ref 15–41)
Albumin: 3.9 g/dL (ref 3.5–5.0)
Alkaline Phosphatase: 56 U/L (ref 38–126)
Anion gap: 11 (ref 5–15)
BUN: 18 mg/dL (ref 8–23)
CO2: 24 mmol/L (ref 22–32)
Calcium: 8.4 mg/dL — ABNORMAL LOW (ref 8.9–10.3)
Chloride: 95 mmol/L — ABNORMAL LOW (ref 98–111)
Creatinine, Ser: 0.84 mg/dL (ref 0.61–1.24)
GFR, Estimated: >60 mL/min (ref >60)
Glucose, Bld: 110 mg/dL — ABNORMAL HIGH (ref 70–99)
Potassium: 3 mmol/L — ABNORMAL LOW (ref 3.5–5.1)
Sodium: 130 mmol/L — ABNORMAL LOW (ref 135–145)
Total Bilirubin: 0.9 mg/dL (ref 0.3–1.2)
Total Protein: 7.3 g/dL (ref 6.5–8.1)

## 2022-03-01 LAB — URINALYSIS, ROUTINE W REFLEX MICROSCOPIC
Bacteria, UA: NONE SEEN
Bilirubin Urine: NEGATIVE
Glucose, UA: NEGATIVE mg/dL
Ketones, ur: NEGATIVE mg/dL
Leukocytes,Ua: NEGATIVE
Nitrite: NEGATIVE
Protein, ur: NEGATIVE mg/dL
Specific Gravity, Urine: 1.008 (ref 1.005–1.030)
pH: 6 (ref 5.0–8.0)

## 2022-03-01 LAB — CBC WITH DIFFERENTIAL/PLATELET
Abs Immature Granulocytes: 0.1 10*3/uL — ABNORMAL HIGH (ref 0.00–0.07)
Basophils Absolute: 0 10*3/uL (ref 0.0–0.1)
Basophils Relative: 0 %
Eosinophils Absolute: 0 10*3/uL (ref 0.0–0.5)
Eosinophils Relative: 0 %
HCT: 42.5 % (ref 39.0–52.0)
Hemoglobin: 14.4 g/dL (ref 13.0–17.0)
Immature Granulocytes: 1 %
Lymphocytes Relative: 2 %
Lymphs Abs: 0.2 10*3/uL — ABNORMAL LOW (ref 0.7–4.0)
MCH: 33.6 pg (ref 26.0–34.0)
MCHC: 33.9 g/dL (ref 30.0–36.0)
MCV: 99.3 fL (ref 80.0–100.0)
Monocytes Absolute: 1 10*3/uL (ref 0.1–1.0)
Monocytes Relative: 8 %
Neutro Abs: 10.3 10*3/uL — ABNORMAL HIGH (ref 1.7–7.7)
Neutrophils Relative %: 89 %
Platelets: 287 10*3/uL (ref 150–400)
RBC: 4.28 MIL/uL (ref 4.22–5.81)
RDW: 11.9 % (ref 11.5–15.5)
WBC: 11.6 10*3/uL — ABNORMAL HIGH (ref 4.0–10.5)
nRBC: 0 % (ref 0.0–0.2)

## 2022-03-01 MED ORDER — SODIUM CHLORIDE 0.9 % IV BOLUS
1000.0000 mL | Freq: Once | INTRAVENOUS | Status: AC
  Administered 2022-03-01: 1000 mL via INTRAVENOUS

## 2022-03-01 MED ORDER — LOPERAMIDE HCL 2 MG PO CAPS: 2.0000 mg | ORAL_CAPSULE | Freq: Four times a day (QID) | ORAL | 0 refills | Status: AC | PRN

## 2022-03-01 NOTE — Discharge Instructions (Addendum)
You can take either the Imodium or the Lomotil to help with the diarrhea ?

## 2022-03-01 NOTE — ED Notes (Signed)
MD notified of sepsis criteria ? ?

## 2022-03-01 NOTE — ED Triage Notes (Signed)
Patient had hip replacement on 3/28 and now having congestion, fever and diarrhea. Reports wife had GI bug and unsure if he has it or it is something with his hip replacement ?

## 2022-03-01 NOTE — ED Provider Notes (Signed)
?[facility] ?Provider Note ? ? ?CSN: [id number] ?Arrival date & time: 03/01/22  1827 ? ?  ? ?History ? ?Chief Complaint  ?Patient presents with  ? Fever  ? ? ?Jeremy [name] is a 71 y.o. male. ? ? ?Fever ?Associated symptoms: diarrhea, nausea and vomiting   ?Patient presents with fever.  Had hip replacement a few days ago.  However developed nausea vomiting some diarrhea.  States his wife has similar symptoms.  No real pain in the hip.  Fever upon arrival.  Otherwise healthy.  States the wound is feeling and looks fine.  States he took a white pill that his wife had for diarrhea and is slow down the diarrhea.  After the pill the diarrhea has slowed down. ?  ? ?Home Medications ?Prior to Admission medications   ?Medication Sig Start Date End Date Taking? Authorizing Provider  ?acetaminophen (TYLENOL) 500 MG tablet Take 2 tablets (1,000 mg total) by mouth every 6 (six) hours as needed for moderate pain or mild pain. 02/27/22  Yes Britt Bottom, PA-C  ?Albuterol Sulfate (PROAIR HFA IN) Inhale 1 puff into the lungs 2 (two) times daily.   Yes [provider]  ?ALPRAZolam (XANAX) 0.5 MG tablet Take 0.25-0.5 mg by mouth 3 (three) times daily as needed for anxiety.   Yes [provider]  ?amLODipine (NORVASC) 10 MG tablet Take 10 mg by mouth daily. 07/28/17  Yes [provider]  ?aspirin EC 81 MG tablet Take 1 tablet (81 mg total) by mouth 2 (two) times daily. For DVT prophylaxis for 30 days after surgery. 02/27/22  Yes Gawne, Trinda Pascal M, PA-C  ?atorvastatin (LIPITOR) 40 MG tablet Take 1 tablet (40 mg total) by mouth daily. 12/31/21 03/31/22 Yes O'Neal, Cassie Freer, MD  ?Cholecalciferol (VITAMIN D) 50 MCG (2000 UT) CAPS Take 4,000 Units by mouth daily.   Yes [provider]  ?cyanocobalamin (,VITAMIN B-12,) 1000 MCG/ML injection Inject 1,000 mcg into the muscle every 30 (thirty) days.   Yes [provider]  ?diltiazem (CARDIZEM CD) 360 MG 24 hr capsule Take  360 mg by mouth daily. 02/06/22  Yes [provider]  ?diphenoxylate-atropine (LOMOTIL) 2.5-0.025 MG tablet Take 1 tablet by mouth 4 (four) times daily as needed for diarrhea or loose stools.   Yes [provider]  ?Fluticasone-Umeclidin-Vilant Viviana Simpler ELLIPTA) 100-62.5-25 MCG/ACT AEPB One first thing am 11/02/21  Yes Tanda Rockers, MD  ?Glucosamine HCl (GLUCOSAMINE PO) Take 2 tablets by mouth daily.   Yes [provider]  ?loperamide (IMODIUM) 2 MG capsule Take 1 capsule (2 mg total) by mouth 4 (four) times daily as needed for diarrhea or loose stools. 03/01/22  Yes Davonna Belling, MD  ?meloxicam (MOBIC) 7.5 MG tablet Take 7.5 mg by mouth daily. 07/08/17  Yes [provider]  ?methocarbamol (ROBAXIN-750) 750 MG tablet Take 1 tablet (750 mg total) by mouth every 8 (eight) hours as needed for muscle spasms. 02/27/22  Yes Gawne, Meghan M, PA-C  ?olmesartan (BENICAR) 40 MG tablet Take 1 tablet (40 mg total) by mouth daily. 08/20/21  Yes Tanda Rockers, MD  ?oxyCODONE (ROXICODONE) 5 MG immediate release tablet Take 1 tablet (5 mg total) by mouth every 6 (six) hours as needed for severe pain. Do not take more than 6 tablets in a 24 hour period. 02/27/22  Yes Gawne, Meghan M, PA-C  ?pantoprazole (PROTONIX) 40 MG tablet Take 40 mg by mouth 2 (two) times daily. 07/10/21  Yes [provider]  ?  potassium chloride SA (KLOR-CON M) 20 MEQ tablet Take 20 mEq by mouth as needed. 02/06/22  Yes [provider]  ?TURMERIC PO Take 1 capsule by mouth daily.   Yes [provider]  ?vitamin E 180 MG (400 UNITS) capsule Take 400 Units by mouth daily.   Yes [provider]  ?ondansetron (ZOFRAN-ODT) 4 MG disintegrating tablet Take 1 tablet (4 mg total) by mouth 2 (two) times daily as needed for nausea or vomiting. 02/27/22   Britt Bottom, PA-C  ?   ? ?Allergies    ?Penicillins and Tape   ? ?Review of Systems   ?Review of Systems  ?Constitutional:  Positive for fever.   ?Gastrointestinal:  Positive for diarrhea, nausea and vomiting.  ? ?Physical Exam ?Updated Vital Signs ?BP (!) 119/54   Pulse 86   Temp 98.2 ?F (36.8 ?C) (Oral)   Resp (!) 21   Ht 5\' 9"  (1.753 m)   Wt 88.5 kg   SpO2 91%   BMI 28.80 kg/m?  ?Physical Exam ?Vitals and nursing note reviewed.  ?Cardiovascular:  ?   Rate and Rhythm: Regular rhythm.  ?Pulmonary:  ?   Breath sounds: No wheezing or rhonchi.  ?Abdominal:  ?   Tenderness: There is no abdominal tenderness.  ?Musculoskeletal:  ?   Cervical back: Neck supple.  ?   Comments: Healing wound dressed on right thigh/hip area.  No fluctuance.  ?Skin: ?   Capillary Refill: Capillary refill takes less than 2 seconds.  ?Neurological:  ?   Mental Status: He is alert and oriented to person, place, and time.  ? ? ?ED Results / Procedures / Treatments   ?Labs ?(all labs ordered are listed, but only abnormal results are displayed) ?Labs Reviewed  ?CBC WITH DIFFERENTIAL/PLATELET - Abnormal; Notable for the following components:  ?    Result Value  ? WBC 11.6 (*)   ? Neutro Abs 10.3 (*)   ? Lymphs Abs 0.2 (*)   ? Abs Immature Granulocytes 0.10 (*)   ? All other components within normal limits  ?COMPREHENSIVE METABOLIC PANEL - Abnormal; Notable for the following components:  ? Sodium 130 (*)   ? Potassium 3.0 (*)   ? Chloride 95 (*)   ? Glucose, Bld 110 (*)   ? Calcium 8.4 (*)   ? AST 85 (*)   ? ALT 53 (*)   ? All other components within normal limits  ?URINALYSIS, ROUTINE W REFLEX MICROSCOPIC - Abnormal; Notable for the following components:  ? Hgb urine dipstick SMALL (*)   ? All other components within normal limits  ? ? ?EKG ?None ? ?Radiology ?No results found. ? ?Procedures ?Procedures  ? ? ?Medications Ordered in ED ?Medications  ?sodium chloride 0.9 % bolus 1,000 mL (0 mLs Intravenous Stopped 03/01/22 2133)  ? ? ?ED Course/ Medical Decision Making/ A&P ?  ?                        ?Medical Decision Making ?Amount and/or Complexity of Data Reviewed ?Labs:  ordered. ? ?Risk ?Prescription drug management. ? ? ?Patient fever nausea vomiting diarrhea.  Patient's wife also would had nausea vomiting diarrhea.  Lab work reassuring.  Reviewed discharge note from recent hip replacement.  Doubt fever is from infection of the wound.  I think likely secondary to a gastroenteritis that his wife also had.  Feels better after some fluids.  Lab work reviewed and overall reassuring.  Mild hypokalemia and white  count mildly elevated.  Urine does not show infection.  Feels better and will discharge home with symptomatic treatment ? ? ? ? ? ? ? ?Final Clinical Impression(s) / ED Diagnoses ?Final diagnoses:  ?Fever, unspecified fever cause  ?Nausea vomiting and diarrhea  ? ? ?Rx / DC Orders ?ED Discharge Orders   ? ?      Ordered  ?  loperamide (IMODIUM) 2 MG capsule  4 times daily PRN       ? 03/01/22 2248  ? ?  ?  ? ?  ? ? ?  ?Davonna Belling, MD ?03/01/22 2358 ? ?

## 2022-03-02 LAB — NICOTINE/COTININE METABOLITES
Cotinine: 167.5 ng/mL
Nicotine: 1.8 ng/mL

## 2022-03-04 ENCOUNTER — Telehealth (HOSPITAL_COMMUNITY): Payer: Self-pay

## 2022-03-04 NOTE — Telephone Encounter (Signed)
He and his wife are sick with a stomach bug and they are not able to go anywhere. Patient will return on 03/12/22. ?

## 2022-03-05 ENCOUNTER — Encounter (HOSPITAL_COMMUNITY): Payer: Medicare HMO

## 2022-03-07 ENCOUNTER — Encounter (HOSPITAL_COMMUNITY): Payer: Medicare HMO

## 2022-03-12 ENCOUNTER — Ambulatory Visit (HOSPITAL_COMMUNITY): Payer: Medicare HMO | Admitting: Physical Therapy

## 2022-03-14 ENCOUNTER — Encounter (HOSPITAL_COMMUNITY): Payer: Medicare HMO

## 2022-03-19 ENCOUNTER — Encounter (HOSPITAL_COMMUNITY): Payer: Medicare HMO | Admitting: Physical Therapy

## 2022-03-21 ENCOUNTER — Encounter (HOSPITAL_COMMUNITY): Payer: Medicare HMO

## 2022-04-30 ENCOUNTER — Ambulatory Visit (HOSPITAL_COMMUNITY)
Admission: RE | Admit: 2022-04-30 | Discharge: 2022-04-30 | Disposition: A | Discharge status: Home / Self Care | Payer: Medicare HMO | Source: Ambulatory Visit | Attending: Hematology | Admitting: Hematology

## 2022-04-30 ENCOUNTER — Other Ambulatory Visit (HOSPITAL_COMMUNITY): Payer: Self-pay | Admitting: *Deleted

## 2022-04-30 ENCOUNTER — Inpatient Hospital Stay (HOSPITAL_COMMUNITY): Payer: Medicare HMO | Attending: Hematology

## 2022-04-30 DIAGNOSIS — R911 Solitary pulmonary nodule: Secondary | ICD-10-CM | POA: Insufficient documentation

## 2022-04-30 DIAGNOSIS — Z87891 Personal history of nicotine dependence: Secondary | ICD-10-CM | POA: Insufficient documentation

## 2022-04-30 DIAGNOSIS — R918 Other nonspecific abnormal finding of lung field: Secondary | ICD-10-CM | POA: Insufficient documentation

## 2022-04-30 DIAGNOSIS — J449 Chronic obstructive pulmonary disease, unspecified: Secondary | ICD-10-CM | POA: Diagnosis not present

## 2022-04-30 DIAGNOSIS — C3490 Malignant neoplasm of unspecified part of unspecified bronchus or lung: Secondary | ICD-10-CM

## 2022-04-30 LAB — DIFFERENTIAL
Abs Immature Granulocytes: 0.03 10*3/uL (ref 0.00–0.07)
Basophils Absolute: 0.1 10*3/uL (ref 0.0–0.1)
Basophils Relative: 1 %
Eosinophils Absolute: 0.2 10*3/uL (ref 0.0–0.5)
Eosinophils Relative: 2 %
Immature Granulocytes: 0 %
Lymphocytes Relative: 15 %
Lymphs Abs: 1.3 10*3/uL (ref 0.7–4.0)
Monocytes Absolute: 1 10*3/uL (ref 0.1–1.0)
Monocytes Relative: 12 %
Neutro Abs: 6 10*3/uL (ref 1.7–7.7)
Neutrophils Relative %: 70 %

## 2022-04-30 LAB — COMPREHENSIVE METABOLIC PANEL
ALT: 19 U/L (ref 0–44)
AST: 19 U/L (ref 15–41)
Albumin: 4.5 g/dL (ref 3.5–5.0)
Alkaline Phosphatase: 84 U/L (ref 38–126)
Anion gap: 10 (ref 5–15)
BUN: 10 mg/dL (ref 8–23)
CO2: 28 mmol/L (ref 22–32)
Calcium: 9.5 mg/dL (ref 8.9–10.3)
Chloride: 104 mmol/L (ref 98–111)
Creatinine, Ser: 0.74 mg/dL (ref 0.61–1.24)
GFR, Estimated: >60 mL/min (ref >60)
Glucose, Bld: 109 mg/dL — ABNORMAL HIGH (ref 70–99)
Potassium: 3.4 mmol/L — ABNORMAL LOW (ref 3.5–5.1)
Sodium: 142 mmol/L (ref 135–145)
Total Bilirubin: 0.3 mg/dL (ref 0.3–1.2)
Total Protein: 7.5 g/dL (ref 6.5–8.1)

## 2022-04-30 LAB — CBC
HCT: 44.2 % (ref 39.0–52.0)
Hemoglobin: 15.3 g/dL (ref 13.0–17.0)
MCH: 32.2 pg (ref 26.0–34.0)
MCHC: 34.6 g/dL (ref 30.0–36.0)
MCV: 93.1 fL (ref 80.0–100.0)
Platelets: 320 10*3/uL (ref 150–400)
RBC: 4.75 MIL/uL (ref 4.22–5.81)
RDW: 12.1 % (ref 11.5–15.5)
WBC: 8.7 10*3/uL (ref 4.0–10.5)
nRBC: 0 % (ref 0.0–0.2)

## 2022-04-30 LAB — MAGNESIUM: Magnesium: 2 mg/dL (ref 1.7–2.4)

## 2022-05-06 ENCOUNTER — Inpatient Hospital Stay (HOSPITAL_COMMUNITY): Payer: Medicare HMO | Attending: Hematology | Admitting: Hematology

## 2022-05-06 ENCOUNTER — Encounter (HOSPITAL_COMMUNITY): Payer: Self-pay | Admitting: Hematology

## 2022-05-06 VITALS — BP 149/67 | HR 80 | Temp 97.8°F | Resp 18 | Wt 191.4 lb

## 2022-05-06 DIAGNOSIS — Z801 Family history of malignant neoplasm of trachea, bronchus and lung: Secondary | ICD-10-CM | POA: Insufficient documentation

## 2022-05-06 DIAGNOSIS — Z87891 Personal history of nicotine dependence: Secondary | ICD-10-CM | POA: Insufficient documentation

## 2022-05-06 DIAGNOSIS — Z77090 Contact with and (suspected) exposure to asbestos: Secondary | ICD-10-CM | POA: Insufficient documentation

## 2022-05-06 DIAGNOSIS — R918 Other nonspecific abnormal finding of lung field: Secondary | ICD-10-CM | POA: Diagnosis present

## 2022-05-06 DIAGNOSIS — C3490 Malignant neoplasm of unspecified part of unspecified bronchus or lung: Secondary | ICD-10-CM

## 2022-05-06 DIAGNOSIS — J449 Chronic obstructive pulmonary disease, unspecified: Secondary | ICD-10-CM | POA: Insufficient documentation

## 2022-05-06 DIAGNOSIS — R911 Solitary pulmonary nodule: Secondary | ICD-10-CM

## 2022-05-06 NOTE — Patient Instructions (Addendum)
[facility] Discharge Instructions   You were seen and examined today by Dr. Delton Coombes.  He reviewed the results of your lab work and CT scan which were normal/stable.   We will see you back in 1 year with a repeat scan of your chest prior.      Thank you for choosing [facility] at [facility] to provide your oncology and hematology care.  To afford each patient quality time with our provider, please arrive at least 15 minutes before your scheduled appointment time.   If you have a lab appointment with the [facility] please come in thru the Main Entrance and check in at the main information desk.  You need to re-schedule your appointment should you arrive 10 or more minutes late.  We strive to give you quality time with our providers, and arriving late affects you and other patients whose appointments are after yours.  Also, if you no show three or more times for appointments you may be dismissed from the clinic at the providers discretion.     Again, thank you for choosing [facility].  Our hope is that these requests will decrease the amount of time that you wait before being seen by our physicians.       _____________________________________________________________  Should you have questions after your visit to [facility], please contact our office at [phone] and follow the prompts.  Our office hours are 8:00 a.m. and 4:30 p.m. Monday - Friday.  Please note that voicemails left after 4:00 p.m. may not be returned until the following business day.  We are closed weekends and major holidays.  You do have access to a nurse 24-7, just call the main number to the clinic [phone] and do not press any options, hold on the line and a nurse will answer the phone.    For prescription refill requests, have your pharmacy contact our office and allow 72 hours.    Due to Covid, you will need to wear a  mask upon entering the hospital. If you do not have a mask, a mask will be given to you at the Main Entrance upon arrival. For doctor visits, patients may have 1 support person age 43 or older with them. For treatment visits, patients can not have anyone with them due to social distancing guidelines and our immunocompromised population.

## 2022-05-06 NOTE — Progress Notes (Signed)
Jeremy [name], [place] [id number]   CLINIC:  Medical Oncology/Hematology  PCP:  [facility], [place] / [place] [address] [phone]   REASON FOR VISIT:  Follow-up for lung mass  PRIOR THERAPY: none  NGS Results: not done  CURRENT THERAPY: surveillance  BRIEF ONCOLOGIC HISTORY:  Oncology History   No history exists.    CANCER STAGING:  Cancer Staging  No matching staging information was found for the patient.  INTERVAL HISTORY:  [name], a 71 y.o. male, returns for routine follow-up of his lung mass. Jeremy [name] was last seen on 10/29/2021.   Today he reports feeling good. He reports SOB with exertion.   REVIEW OF SYSTEMS:  Review of Systems  Constitutional:  Negative for appetite change and fatigue.  Respiratory:  Positive for cough (COPD) and shortness of breath (with exertion - COPD).   All other systems reviewed and are negative.  PAST MEDICAL/SURGICAL HISTORY:  Past Medical History:  Diagnosis Date   Alcohol use    daily   Anxiety    hx (05/05/2018)   Arthritis    "all over" (05/05/2018)   Asthma    Chronic bronchitis (HCC)    COPD (chronic obstructive pulmonary disease) (HCC)    Dyspnea    Family history of adverse reaction to anesthesia    "mom would have nausea after OR"    GERD (gastroesophageal reflux disease)    History of hiatal hernia    Hypertension    Pneumonia X 1   PONV (postoperative nausea and vomiting)    Seasonal allergies    "spring, summer, fall"   Sleep apnea    Past Surgical History:  Procedure Laterality Date   ANTERIOR CERVICAL DECOMP/DISCECTOMY FUSION Left    BACK SURGERY     COLONOSCOPY     JOINT REPLACEMENT     LAPAROSCOPIC CHOLECYSTECTOMY     LUMBAR DISC SURGERY  X 2   L4-5   NASAL POLYP EXCISION  ~ [place] Left 05/05/2018   TOTAL HIP ARTHROPLASTY Left 05/05/2018   Procedure: LEFT TOTAL HIP ARTHROPLASTY  ANTERIOR APPROACH;  Surgeon: [name], MD;  Location: [facility];  Service: Orthopedics;  Laterality: Left;   TOTAL HIP ARTHROPLASTY Right 02/26/2022   Procedure: TOTAL HIP ARTHROPLASTY ANTERIOR APPROACH;  Surgeon: [name], MD;  Location: [facility];  Service: Orthopedics;  Laterality: Right;    SOCIAL HISTORY:  Social History   Socioeconomic History   Marital status: Married    Spouse name: Not on file   Number of children: 2   Years of education: Not on file   Highest education level: Not on file  Occupational History   Occupation: Retired   Occupation: retired  Tobacco Use   Smoking status: Former    Packs/day: 1.00    Years: 60.00    Pack years: 60.00    Types: Cigarettes, Pipe    Quit date: 06/13/2021    Years since quitting: 0.8   Smokeless tobacco: Never  Vaping Use   Vaping Use: Every day  Substance and Sexual Activity   Alcohol use: Yes    Alcohol/week: 3.0 standard drinks    Types: 3 Shots of liquor per week    Comment: 3 drinks per day   Drug use: Never   Sexual activity: Not Currently  Other Topics Concern   Not on file  Social History Narrative  Not on file   Social Determinants of Health   Financial Resource Strain: Low Risk    Difficulty of Paying Living Expenses: Not hard at all  Food Insecurity: No Food Insecurity   Worried About Charity fundraiser in the Last Year: Never true   [place] in the Last Year: Never true  Transportation Needs: No Transportation Needs   Lack of Transportation (Medical): No   Lack of Transportation (Non-Medical): No  Physical Activity: Inactive   Days of Exercise per Week: 0 days   Minutes of Exercise per Session: 0 min  Stress: No Stress Concern Present   Feeling of Stress : Not at all  Social Connections: Moderately Integrated   Frequency of Communication with Friends and Family: More than three times a week   Frequency of Social Gatherings with Friends and Family: More than three times a week    Attends Religious Services: Never   Marine scientist or Organizations: Yes   Attends Music therapist: More than 4 times per year   Marital Status: Married  Human resources officer Violence: Not At Risk   Fear of Current or Ex-Partner: No   Emotionally Abused: No   Physically Abused: No   Sexually Abused: No    FAMILY HISTORY:  Family History  Problem Relation Age of Onset   Heart disease Mother    Heart disease Father     CURRENT MEDICATIONS:  Current Outpatient Medications  Medication Sig Dispense Refill   acetaminophen (TYLENOL) 500 MG tablet Take 2 tablets (1,000 mg total) by mouth every 6 (six) hours as needed for moderate pain or mild pain. 30 tablet 0   Albuterol Sulfate (PROAIR HFA IN) Inhale 1 puff into the lungs 2 (two) times daily.     ALPRAZolam (XANAX) 0.5 MG tablet Take 0.25-0.5 mg by mouth 3 (three) times daily as needed for anxiety.     amLODipine (NORVASC) 10 MG tablet Take 10 mg by mouth daily.     aspirin EC 81 MG tablet Take 1 tablet (81 mg total) by mouth 2 (two) times daily. For DVT prophylaxis for 30 days after surgery. 60 tablet 0   atorvastatin (LIPITOR) 40 MG tablet Take 1 tablet (40 mg total) by mouth daily. 90 tablet 3   Cholecalciferol (VITAMIN D) 50 MCG (2000 UT) CAPS Take 4,000 Units by mouth daily.     cyanocobalamin (,VITAMIN B-12,) 1000 MCG/ML injection Inject 1,000 mcg into the muscle every 30 (thirty) days.     diltiazem (CARDIZEM CD) 360 MG 24 hr capsule Take 360 mg by mouth daily.     diphenoxylate-atropine (LOMOTIL) 2.5-0.025 MG tablet Take 1 tablet by mouth 4 (four) times daily as needed for diarrhea or loose stools.     Fluticasone-Umeclidin-Vilant (TRELEGY ELLIPTA) 100-62.5-25 MCG/ACT AEPB One first thing am 1 each 11   Glucosamine HCl (GLUCOSAMINE PO) Take 2 tablets by mouth daily.     loperamide (IMODIUM) 2 MG capsule Take 1 capsule (2 mg total) by mouth 4 (four) times daily as needed for diarrhea or loose stools. 12 capsule 0    meloxicam (MOBIC) 7.5 MG tablet Take 7.5 mg by mouth daily.     methocarbamol (ROBAXIN-750) 750 MG tablet Take 1 tablet (750 mg total) by mouth every 8 (eight) hours as needed for muscle spasms. 20 tablet 0   olmesartan (BENICAR) 40 MG tablet Take 1 tablet (40 mg total) by mouth daily. 30 tablet 11   ondansetron (ZOFRAN-ODT) 4 MG disintegrating  tablet Take 1 tablet (4 mg total) by mouth 2 (two) times daily as needed for nausea or vomiting. 10 tablet 0   oxyCODONE (ROXICODONE) 5 MG immediate release tablet Take 1 tablet (5 mg total) by mouth every 6 (six) hours as needed for severe pain. Do not take more than 6 tablets in a 24 hour period. 28 tablet 0   pantoprazole (PROTONIX) 40 MG tablet Take 40 mg by mouth 2 (two) times daily.     potassium chloride SA (KLOR-CON M) 20 MEQ tablet Take 20 mEq by mouth as needed.     TURMERIC PO Take 1 capsule by mouth daily.     vitamin E 180 MG (400 UNITS) capsule Take 400 Units by mouth daily.     No current facility-administered medications for this visit.    ALLERGIES:  Allergies  Allergen Reactions   Penicillins Other (See Comments)    Has patient had a PCN reaction causing immediate rash, facial/tongue/throat swelling, SOB or lightheadedness with hypotension:##Yes# but pt states he is ok with amoxicillin. Has patient had a PCN reaction causing severe rash involving mucus membranes or skin necrosis: No Has patient had a PCN reaction that required hospitalization: No Has patient had a PCN reaction occurring within the last 10 years: No If all of the above answers are "NO", then may proceed with Cephalosporin use.     Tape Other (See Comments)    Skin tears and bruises-use paper tape    PHYSICAL EXAM:  Performance status (ECOG): 1 - Symptomatic but completely ambulatory  Vitals:   05/06/22 1140  BP: (!) 149/67  Pulse: 80  Resp: 18  Temp: 97.8 F (36.6 C)  SpO2: 97%   Wt Readings from Last 3 Encounters:  05/06/22 191 lb 6.4 oz (86.8 kg)   03/01/22 195 lb (88.5 kg)  02/26/22 195 lb 15.8 oz (88.9 kg)   Physical Exam Vitals reviewed.  Constitutional:      Appearance: Normal appearance.  Cardiovascular:     Rate and Rhythm: Normal rate and regular rhythm.     Pulses: Normal pulses.     Heart sounds: Normal heart sounds.  Pulmonary:     Effort: Pulmonary effort is normal.     Breath sounds: Normal breath sounds.  Neurological:     General: No focal deficit present.     Mental Status: He is alert and oriented to person, place, and time.  Psychiatric:        Mood and Affect: Mood normal.        Behavior: Behavior normal.     LABORATORY DATA:  I have reviewed the labs as listed.     Latest Ref Rng & Units 04/30/2022   10:57 AM 03/01/2022    7:45 PM 02/26/2022   12:40 PM  CBC  WBC 4.0 - 10.5 K/uL 8.7   11.6   14.7    Hemoglobin 13.0 - 17.0 g/dL 15.3   14.4   15.8    Hematocrit 39.0 - 52.0 % 44.2   42.5   45.4    Platelets 150 - 400 K/uL 320   287   302        Latest Ref Rng & Units 04/30/2022    9:57 AM 03/01/2022    7:45 PM 02/26/2022   12:40 PM  CMP  Glucose 70 - 99 mg/dL 109   110     BUN 8 - 23 mg/dL 10   18     Creatinine 0.61 - 1.24 mg/dL  0.74   0.84   0.77    Sodium 135 - 145 mmol/L 142   130     Potassium 3.5 - 5.1 mmol/L 3.4   3.0     Chloride 98 - 111 mmol/L 104   95     CO2 22 - 32 mmol/L 28   24     Calcium 8.9 - 10.3 mg/dL 9.5   8.4     Total Protein 6.5 - 8.1 g/dL 7.5   7.3     Total Bilirubin 0.3 - 1.2 mg/dL 0.3   0.9     Alkaline Phos 38 - 126 U/L 84   56     AST 15 - 41 U/L 19   85     ALT 0 - 44 U/L 19   53       DIAGNOSTIC IMAGING:  I have independently reviewed the scans and discussed with the patient. CT Chest Wo Contrast  Result Date: 04/30/2022 CLINICAL DATA:  Follow-up indeterminate pulmonary nodules. EXAM: CT CHEST WITHOUT CONTRAST TECHNIQUE: Multidetector CT imaging of the chest was performed following the standard protocol without IV contrast. RADIATION DOSE REDUCTION: This  exam was performed according to the departmental dose-optimization program which includes automated exposure control, adjustment of the mA and/or kV according to patient size and/or use of iterative reconstruction technique. COMPARISON:  10/22/2021 FINDINGS: Cardiovascular: No acute findings. Aortic and coronary atherosclerotic calcification incidentally noted. Mediastinum/Nodes: No masses or pathologically enlarged lymph nodes identified on this unenhanced exam. Lungs/Pleura: Moderate centrilobular emphysema again noted. Sites of pleural-parenchymal scarring in both upper lobes remain stable since prior exam. A 3 mm pulmonary nodule in the lateral left lower lobe on image 109/4 remains stable. No new or enlarging pulmonary nodules or masses are identified. No evidence of acute infiltrate or pleural effusion. Upper Abdomen:  Unremarkable. Musculoskeletal:  No suspicious bone lesions. IMPRESSION: Stable 3 mm left lower lobe pulmonary nodule, consistent with benign etiology. Stable bilateral upper lobe pleural-parenchymal scarring and centrilobular emphysema. Aortic Atherosclerosis (ICD10-I70.0) and Emphysema (ICD10-J43.9). Electronically Signed   By: Marlaine Hind M.D.   On: 04/30/2022 15:35     ASSESSMENT:  1.  Left lung nodule and a possible right lung mass: -He was hospitalized from 06/14/2021 through 06/16/2021 with pneumonia and was treated with Omnicef. - He presented to the ER on 06/27/2021 with fever. - CT chest without contrast on 06/27/2021 showed patchy irregular-appearing airspace disease seen within the lateral aspect of the right upper lobe.  A 1.2 x 1 cm irregular left upper lobe nodule noted.  Several areas of focal scarring within the posteromedial aspect of the right upper lobe and posterior aspect of the left upper lobe.  Multiple 4 mm noncalcified lung nodule seen within the posterior aspect of the left lower lobe.  2.7 x 1.0 cm pleural-based area of low-attenuation seen in the posterior aspect  of the right lung base. - He was sent home on 7 days of Levaquin.  He has 1 more day left. - Reports improvement in the cough and expectoration. - Denies any weight loss.  No fever since last week.  No chest pains reported.  2.  Social/family history: - Lives with wife at home. - He quit smoking on 06/13/2021.  Prior to that he smoked 1 pack/day for 62 years. - Has exposure to asbestos on and off for 5 to 6 years. 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 - Father had lung cancer.  PLAN:  1.  Bilateral lung masses: - Reviewed CT chest without contrast on 04/30/2022: Stable 3 mm left lower lobe pulmonary nodule consistent with a benign etiology and stable bilateral upper lobe pleural-parenchymal scarring and emphysema. - Labs reviewed by me shows normal LFTs and CBC. - Recommend follow-up in 1 year with repeat CT of the chest without contrast.  2.  COPD: - Continue inhalers and follow-up with Dr. Melvyn Novas.   Orders placed this encounter:  No orders of the defined types were placed in this encounter.    Derek Jack, MD [place] [phone]   I, Thana Ates, am acting as a scribe for Dr. Derek Jack.  I, Derek Jack MD, have reviewed the above documentation for accuracy and completeness, and I agree with the above.

## 2022-07-08 ENCOUNTER — Other Ambulatory Visit: Payer: Self-pay | Admitting: Internal Medicine

## 2022-07-19 ENCOUNTER — Encounter: Payer: Self-pay | Admitting: Internal Medicine

## 2022-07-19 ENCOUNTER — Ambulatory Visit: Payer: Medicare HMO | Admitting: Internal Medicine

## 2022-07-19 DIAGNOSIS — L270 Generalized skin eruption due to drugs and medicaments taken internally: Secondary | ICD-10-CM | POA: Diagnosis not present

## 2022-07-19 DIAGNOSIS — J449 Chronic obstructive pulmonary disease, unspecified: Secondary | ICD-10-CM | POA: Diagnosis not present

## 2022-07-19 MED ORDER — METHYLPREDNISOLONE ACETATE 80 MG/ML IJ SUSP
120.0000 mg | Freq: Once | INTRAMUSCULAR | Status: AC
  Administered 2022-07-19: 120 mg via INTRAMUSCULAR

## 2022-07-19 NOTE — Assessment & Plan Note (Addendum)
Quit smoking cigs 06/2021  - 08/20/2021  After extensive coaching inhaler device,  effectiveness =  hfa   75% (short ti) > continue dpi trelegy with approp saba prn  - Spirometry 12/24/2021  FEV1 1.40 (43%)  Ratio 0.57 p trelegy prior with classic f/v loop    - Labs ordered 07/19/2022  :  allergy profile   alpha one AT phenotype   - 07/19/2022   Walked on RA  x  3  lap(s) =  approx 450  ft  @ fast pace, stopped due to end of study s sob with lowest 02 sats 93%      Group D (now reclassified as E) in terms of symptom/risk and laba/lama/ICS  therefore appropriate rx at this point >>>  Continue trelegy 100 and approp saba  Re SABA :  I spent extra time with pt today reviewing appropriate use of albuterol for prn use on exertion with the following points: 1) saba is for relief of sob that does not improve by walking a slower pace or resting but rather if the pt does not improve after trying this first. 2) If the pt is convinced, as many are, that saba helps recover from activity faster then it's easy to tell if this is the case by re-challenging : ie stop, take the inhaler, then p 5 minutes try the exact same activity (intensity of workload) that just caused the symptoms and see if they are substantially diminished or not after saba 3) if there is an activity that reproducibly causes the symptoms, try the saba 15 min before the activity on alternate days   If in fact the saba really does help, then fine to continue to use it prn but advised may need to look closer at the maintenance regimen being used to achieve better control of airways disease with exertion.

## 2022-07-19 NOTE — Patient Instructions (Addendum)
Please remember to go to the lab department   for your tests - we will call you with the results when they are available.  Depomedrol 120 mg IM today   Ok to try benadryl as needed for itching   Wean off all vaping as soon as possible (one way bridge off all nicotine products)   Please schedule a follow up visit in 6  months but call sooner if needed

## 2022-07-19 NOTE — Progress Notes (Signed)
Jeremy [name], male    DOB: 26-Dec-1950  MRN: [id number]   Brief patient profile:  36 yowm quit smoking cigarettes 06/2021 and stopped pipe 11/2021 Jeremy [name] vaping   referred to pulmonary clinic in [facility]  08/20/2021 by Jeremy [name])   s/p admit for CAP with residual dz ? All post inflammatory acute lung injury   Admit date: 06/14/2021  Discharge date: 06/16/21     Brief/Interim Summary: Hx significant of COPD, essential hypertension, tobacco use disorder, alcohol use disorder, GERD, history of lumbar surgery, history of cervical spinal surgery, primary osteoarthritis of hip s/p left total hip arthroplasty presents with shortness of breath with associated fever, productive sputum of 2 day duration found to have interstitial pneumonia on CXR. Given patient's older age, history of COPD, hypoxia, fever, and hyponatremia, PSI/PORT score (107) indicated moderate mortality risk patient was hospitalized for inpatient treatment of pneumonia. Patient was de satting at 87% on room air and improved to 94% on 2L [place]. Patient received prednisone 40mg , antibiotics Rocephin and azithromycin IV. Blood culture was obtained showing...    Overnight, patient improved significantly with absence of fever for >15 hours. Patient tried exertion with and without oxygen to determine oxygen demand... Tobacco cessation counseling was provided at bedside.      Discharge Diagnoses:    Acute hypoxemic respiratory failure ([place])   Community acquired pneumonia   Acute on chronic respiratory failure secondary to COPD exacerbation and community acquired pneumonia    History of Present Illness  08/20/2021  Pulmonary/ 1st office eval/ Jeremy [name] / [place] on ACEi  Chief Complaint  Patient presents with   Consult    Pneumonia in the hospital 2X. Er doctor noted some places on the lung after Chest CT. PCP noted a spot on upper right lung. Here for recommendation from hospital visit. Smokes a tobacco pipe 3X day  Dyspnea:   limited R hip / can do room to room at home maybe 50 ft Cough: some cough/ congestion but not productive never bloody Sleep: 10 degrees with bricks / bed one pillow, has cpap but not using  SABA use: 3-4 x per day despite trelegy  Overt HB despite bed blocks and ppi bid  Rec Plan A = Automatic = Always=    Trelegy one click daily  Plan B = Backup (to supplement plan A, not to replace it) Only use your albuterol inhaler as a rescue medication Ok to try albuterol 15 min before an activity (on alternating days)  that you know would usually make you short of breath  Stop lisinopril and substitute olmesartan 40 mg one daily and many of your symptoms should improve. PFTs should be done next available and I will call you with results > not done Suggested e-cigs as an optional  "one way bridge"  Off all tobacco products     11/02/2021  f/u ov/[place] office/Jeremy [name] re: RUL pna/ copd/ ? Acei case  maint on trelegy 200 and taking it at noon   Chief Complaint  Patient presents with   Follow-up    F/u visit for surgery discussion. Feels he has improved a little since last OV.   Dyspnea:  100 ft = MMRC3 = can't walk 100 yards even at a slow pace at a flat grade s stopping due to sob   Cough: nasal congestion / using neosynephrine x 2 weeks had been using pseudofed previously  Sleeping: bed blocks / pillow x 1  SABA use: q am (not the instructions given)  02: none  Covid status: vax x 5   Rec Plan A = Automatic = Always=    Trelegy 323 one click first thing in am and blow out through nose  Plan B = Backup (to supplement plan A, not to replace it) Only use your albuterol inhaler as a rescue medication  Flonase  has no immediate benefit in terms of improving symptoms.     R THR  02/26/22   07/19/2022  f/u ov/[place] office/Jeremy [name] re: GOLD 3  maint on Trelegy   Chief Complaint  Patient presents with   Follow-up    Pt states he had hip surgery on 02/26/2022. Breathing is doing ok with some SOB.    Dyspnea:  10 min walks slowed by hips > sob  Cough: minimal in am mucoid Sleeping: flat bed on side one pillow  SABA use: once or twice a day 02: none Covid status: vax all / never infected Lung cancer screening: per Jeremy [name] per pt    No obvious day to day or daytime variability or assoc excess/ purulent sputum or mucus plugs or hemoptysis or cp or chest tightness, subjective wheeze or overt sinus or hb symptoms.   Sleeping without nocturnal  exacerbation  of respiratory  c/o's or need for noct saba. Also denies any obvious fluctuation of symptoms with weather or environmental changes or other aggravating or alleviating factors except as outlined above   No unusual exposure hx or h/o childhood pna/ asthma or knowledge of premature birth.  Current Allergies, Complete Past Medical History, Past Surgical History, Family History, and Social History were reviewed in [facility] record.  ROS  The following are not active complaints unless bolded Hoarseness, sore throat, dysphagia, dental problems, itching, sneezing,  nasal congestion or discharge of excess mucus or purulent secretions, ear ache,   fever, chills, sweats, unintended wt loss or wt gain, classically pleuritic or exertional cp,  orthopnea pnd or arm/hand swelling  or leg swelling, presyncope, palpitations, abdominal pain, anorexia, nausea, vomiting, diarrhea  or change in bowel habits or change in bladder habits, change in stools or change in urine, dysuria, hematuria,  rash, arthralgias, visual complaints, headache, numbness, weakness or ataxia or problems with walking or coordination,  change in mood or  memory.        Current Meds  Medication Sig   acetaminophen (TYLENOL) 500 MG tablet Take 2 tablets (1,000 mg total) by mouth every 6 (six) hours as needed for moderate pain or mild pain.   Albuterol Sulfate (PROAIR HFA IN) Inhale 1 puff into the lungs 2 (two) times daily.   ALPRAZolam (XANAX) 0.5 MG tablet Take  0.25-0.5 mg by mouth 3 (three) times daily as needed for anxiety.   amLODipine (NORVASC) 10 MG tablet Take 10 mg by mouth daily.   aspirin EC 81 MG tablet Take 1 tablet (81 mg total) by mouth 2 (two) times daily. For DVT prophylaxis for 30 days after surgery.   atorvastatin (LIPITOR) 40 MG tablet Take 1 tablet (40 mg total) by mouth daily.   Cholecalciferol (VITAMIN D) 50 MCG (2000 UT) CAPS Take 4,000 Units by mouth daily.   cyanocobalamin (,VITAMIN B-12,) 1000 MCG/ML injection Inject 1,000 mcg into the muscle every 30 (thirty) days.   diltiazem (CARDIZEM CD) 360 MG 24 hr capsule Take 360 mg by mouth daily.   Fluticasone-Umeclidin-Vilant (TRELEGY ELLIPTA) 100-62.5-25 MCG/ACT AEPB One first thing am   Glucosamine HCl (GLUCOSAMINE PO) Take 2 tablets by mouth daily.   loperamide (  IMODIUM) 2 MG capsule Take 1 capsule (2 mg total) by mouth 4 (four) times daily as needed for diarrhea or loose stools.   meloxicam (MOBIC) 7.5 MG tablet Take 7.5 mg by mouth daily.   methocarbamol (ROBAXIN-750) 750 MG tablet Take 1 tablet (750 mg total) by mouth every 8 (eight) hours as needed for muscle spasms.   olmesartan (BENICAR) 40 MG tablet TAKE ONE TABLET BY MOUTH ONCE DAILY.   ondansetron (ZOFRAN-ODT) 4 MG disintegrating tablet Take 1 tablet (4 mg total) by mouth 2 (two) times daily as needed for nausea or vomiting.   pantoprazole (PROTONIX) 40 MG tablet Take 40 mg by mouth 2 (two) times daily.   potassium chloride SA (KLOR-CON M) 20 MEQ tablet Take 20 mEq by mouth as needed.   TURMERIC PO Take 1 capsule by mouth daily.   vitamin E 180 MG (400 UNITS) capsule Take 400 Units by mouth daily.               Past Medical History:  Diagnosis Date   Alcohol use    daily   Anxiety    hx (05/05/2018)   Arthritis    "all over" (05/05/2018)   Asthma    Chronic bronchitis ([place])    Dyspnea    Family history of adverse reaction to anesthesia    "mom would have nausea after OR"    GERD (gastroesophageal reflux  disease)    History of hiatal hernia    Hypertension    Pneumonia X 1   PONV (postoperative nausea and vomiting)    Seasonal allergies    "spring, summer, fall"        Objective:    07/19/2022       194   11/02/21 196 lb 1.9 oz (89 kg)  10/29/21 192 lb 14.4 oz (87.5 kg)  08/20/21 192 lb (87.1 kg)    Pleasantly talkative wm nad   HEENT : Oropharynx  clear   Nasal turbinates nl    NECK :  without  apparent JVD/ palpable Nodes/TM    LUNGS: no acc muscle use,  Mild barrel  contour chest wall with bilateral  Distant bs s audible wheeze and  without cough on insp or exp maneuvers  and mild  Hyperresonant  to  percussion bilaterally     CV:  RRR  no s3 or murmur or increase in P2, and no edema   ABD:  obese soft and nontender with pos end  insp Hoover's  in the supine position.  No bruits or organomegaly appreciated   MS:  Nl gait/ ext warm without deformities Or obvious joint restrictions  calf tenderness, cyanosis or clubbing    SKIN: warm and dry with erythematous morbilliform rash  x 48 h   NEURO:  alert, approp, nl sensorium with  no motor or cerebellar deficits apparent.         I personally reviewed images and agree with radiology impression as follows:   Chest CT w/o contrast  04/30/22  Stable 3 mm left lower lobe pulmonary nodule, consistent with benign etiology. Stable bilateral upper lobe pleural-parenchymal scarring and centrilobular emphysema.  Aortic Atherosclerosis (ICD10-I70.0) and Emphysema (ICD10-J43.9).      Assessment

## 2022-07-19 NOTE — Assessment & Plan Note (Signed)
Onset around mid Aug 2023 / mildly pleuritic/ truncal  - only "new" med = double dose of statin  >>  Check eos /IgE / LFTs  >>> rx depomedrol 120 mg IM/ benadryl prn > referred to PCP   Doubt it's the statin as only change was higher dose > may need derm to sort out but defer to PCP for f/u   Each maintenance medication was reviewed in detail including emphasizing most importantly the difference between maintenance and prns and under what circumstances the prns are to be triggered using an action plan format where appropriate.  Total time for H and P, chart review, counseling,  directly observing portions of ambulatory 02 saturation study/ and generating customized AVS unique to this office visit / same day charting > 30 min for chronic copd care and new rash w/u.

## 2022-07-24 LAB — CBC WITH DIFFERENTIAL/PLATELET
Basophils Absolute: 0.1 10*3/uL (ref 0.0–0.2)
Basos: 1 %
EOS (ABSOLUTE): 0.2 10*3/uL (ref 0.0–0.4)
Eos: 2 %
Hematocrit: 45.6 % (ref 37.5–51.0)
Hemoglobin: 15.9 g/dL (ref 13.0–17.7)
Immature Grans (Abs): 0 10*3/uL (ref 0.0–0.1)
Immature Granulocytes: 0 %
Lymphocytes Absolute: 1 10*3/uL (ref 0.7–3.1)
Lymphs: 11 %
MCH: 32.3 pg (ref 26.6–33.0)
MCHC: 34.9 g/dL (ref 31.5–35.7)
MCV: 93 fL (ref 79–97)
Monocytes Absolute: 0.9 10*3/uL (ref 0.1–0.9)
Monocytes: 10 %
Neutrophils Absolute: 6.8 10*3/uL (ref 1.4–7.0)
Neutrophils: 76 %
Platelets: 324 10*3/uL (ref 150–450)
RBC: 4.92 x10E6/uL (ref 4.14–5.80)
RDW: 13.3 % (ref 11.6–15.4)
WBC: 9 10*3/uL (ref 3.4–10.8)

## 2022-07-24 LAB — HEPATIC FUNCTION PANEL
ALT: 19 IU/L (ref 0–44)
AST: 25 IU/L (ref 0–40)
Albumin: 4.5 g/dL (ref 3.8–4.8)
Alkaline Phosphatase: 99 IU/L (ref 44–121)
Bilirubin Total: 0.4 mg/dL (ref 0.0–1.2)
Bilirubin, Direct: 0.15 mg/dL (ref 0.00–0.40)
Total Protein: 7.1 g/dL (ref 6.0–8.5)

## 2022-07-24 LAB — ALPHA-1-ANTITRYPSIN PHENOTYP: A-1 Antitrypsin: 149 mg/dL (ref 101–187)

## 2022-07-24 LAB — IGE: IgE (Immunoglobulin E), Serum: 241 IU/mL (ref 6–495)

## 2022-07-25 NOTE — Progress Notes (Signed)
Spoke with pt and notified of results per Dr. Wert. Pt verbalized understanding and denied any questions. 

## 2022-08-05 ENCOUNTER — Ambulatory Visit
Admission: EM | Admit: 2022-08-05 | Discharge: 2022-08-05 | Disposition: A | Discharge status: Home / Self Care | Payer: Medicare HMO | Attending: Family Medicine | Admitting: Family Medicine

## 2022-08-05 DIAGNOSIS — R21 Rash and other nonspecific skin eruption: Secondary | ICD-10-CM | POA: Diagnosis not present

## 2022-08-05 MED ORDER — METHYLPREDNISOLONE SODIUM SUCC 125 MG IJ SOLR
80.0000 mg | Freq: Once | INTRAMUSCULAR | Status: AC
  Administered 2022-08-05: 80 mg via INTRAMUSCULAR

## 2022-08-05 NOTE — ED Triage Notes (Signed)
Pt present rash rash on both legs with itching. Pt states he recently received a shot concerning the rash but symptom has gotten worst.

## 2022-08-06 NOTE — ED Provider Notes (Signed)
[facility]    CSN: [id number] Arrival date & time: 08/05/22  1351      History   Chief Complaint Chief Complaint  Patient presents with   Rash    HPI Jeremy [name] is a 71 y.o. male.   Patient presenting today with a rash to b/l legs, first appeared last month and resolved by IM solumedrol given by Pulmonologist. Recurred about 2 days ago and is now also on UEs and abdomen. Does not itch or hurt and no new medications, products at home, foods, outdoor exposures. Has not yet been able to get in with Dermatology. Had labs the day he was seen at the Pulmonologist as well as last week with PCP, all stable and overall WNL. Denies associated chest tightness, wheezing, throat itching or swelling, N/V.     Past Medical History:  Diagnosis Date   Alcohol use    daily   Anxiety    hx (05/05/2018)   Arthritis    "all over" (05/05/2018)   Asthma    Chronic bronchitis (HCC)    COPD (chronic obstructive pulmonary disease) (HCC)    Dyspnea    Family history of adverse reaction to anesthesia    "mom would have nausea after OR"    GERD (gastroesophageal reflux disease)    History of hiatal hernia    Hypertension    Pneumonia X 1   PONV (postoperative nausea and vomiting)    Seasonal allergies    "spring, summer, fall"   Sleep apnea     Patient Active Problem List   Diagnosis Date Noted   Drug-induced skin rash 07/19/2022   S/P total right hip arthroplasty 02/26/2022   Rhinitis, chronic 11/03/2021   COPD GOLD 3 08/20/2021   Lung nodule 07/02/2021   Acute hypoxemic respiratory failure ([place]) 06/14/2021   Community acquired pneumonia 06/14/2021   Primary osteoarthritis of hip 05/05/2018   Alcohol use 03/30/2018   Primary osteoarthritis of left hip 03/30/2018   Essential hypertension 03/30/2018   History of lumbar surgery 03/30/2018   History of cervical spinal surgery 03/30/2018   Tobacco abuse disorder 03/30/2018    Past Surgical History:  Procedure  Laterality Date   ANTERIOR CERVICAL DECOMP/DISCECTOMY FUSION Left    BACK SURGERY     COLONOSCOPY     JOINT REPLACEMENT     LAPAROSCOPIC CHOLECYSTECTOMY     LUMBAR DISC SURGERY  X 2   L4-5   NASAL POLYP EXCISION  ~ 1970   TONSILLECTOMY     TOTAL HIP ARTHROPLASTY Left 05/05/2018   TOTAL HIP ARTHROPLASTY Left 05/05/2018   Procedure: LEFT TOTAL HIP ARTHROPLASTY ANTERIOR APPROACH;  Surgeon: [name], MD;  Location: [facility];  Service: Orthopedics;  Laterality: Left;   TOTAL HIP ARTHROPLASTY Right 02/26/2022   Procedure: TOTAL HIP ARTHROPLASTY ANTERIOR APPROACH;  Surgeon: [name], MD;  Location: [facility];  Service: Orthopedics;  Laterality: Right;       Home Medications    Prior to Admission medications   Medication Sig Start Date End Date Taking? Authorizing Provider  acetaminophen (TYLENOL) 500 MG tablet Take 2 tablets (1,000 mg total) by mouth every 6 (six) hours as needed for moderate pain or mild pain. 02/27/22   Britt Bottom, PA-C  Albuterol Sulfate (PROAIR HFA IN) Inhale 1 puff into the lungs 2 (two) times daily.    [provider]  ALPRAZolam Duanne Moron) 0.5 MG tablet Take 0.25-0.5 mg by mouth 3 (three) times daily as needed for anxiety.  [provider]  amLODipine (NORVASC) 10 MG tablet Take 10 mg by mouth daily. 07/28/17   [provider]  aspirin EC 81 MG tablet Take 1 tablet (81 mg total) by mouth 2 (two) times daily. For DVT prophylaxis for 30 days after surgery. 02/27/22   Britt Bottom, PA-C  atorvastatin (LIPITOR) 40 MG tablet Take 1 tablet (40 mg total) by mouth daily. 12/31/21 07/19/22  O'NealCassie Freer, MD  Cholecalciferol (VITAMIN D) 50 MCG (2000 UT) CAPS Take 4,000 Units by mouth daily.    [provider]  cyanocobalamin (,VITAMIN B-12,) 1000 MCG/ML injection Inject 1,000 mcg into the muscle every 30 (thirty) days.    [provider]  diltiazem (CARDIZEM CD) 360 MG 24 hr capsule Take 360 mg by mouth daily.  02/06/22   [provider]  Fluticasone-Umeclidin-Vilant Viviana Simpler ELLIPTA) 100-62.5-25 MCG/ACT AEPB One first thing am 11/02/21   Tanda Rockers, MD  Glucosamine HCl (GLUCOSAMINE PO) Take 2 tablets by mouth daily.    [provider]  loperamide (IMODIUM) 2 MG capsule Take 1 capsule (2 mg total) by mouth 4 (four) times daily as needed for diarrhea or loose stools. 03/01/22   Davonna Belling, MD  meloxicam (MOBIC) 7.5 MG tablet Take 7.5 mg by mouth daily. 07/08/17   [provider]  methocarbamol (ROBAXIN-750) 750 MG tablet Take 1 tablet (750 mg total) by mouth every 8 (eight) hours as needed for muscle spasms. 02/27/22   Britt Bottom, PA-C  olmesartan (BENICAR) 40 MG tablet TAKE ONE TABLET BY MOUTH ONCE DAILY. 07/08/22   Tanda Rockers, MD  ondansetron (ZOFRAN-ODT) 4 MG disintegrating tablet Take 1 tablet (4 mg total) by mouth 2 (two) times daily as needed for nausea or vomiting. 02/27/22   Britt Bottom, PA-C  pantoprazole (PROTONIX) 40 MG tablet Take 40 mg by mouth 2 (two) times daily. 07/10/21   [provider]  potassium chloride SA (KLOR-CON M) 20 MEQ tablet Take 20 mEq by mouth as needed. 02/06/22   [provider]  TURMERIC PO Take 1 capsule by mouth daily.    [provider]  vitamin E 180 MG (400 UNITS) capsule Take 400 Units by mouth daily.    [provider]    Family History Family History  Problem Relation Age of Onset   Heart disease Mother    Heart disease Father     Social History Social History   Tobacco Use   Smoking status: Former    Packs/day: 1.00    Years: 60.00    Total pack years: 60.00    Types: Cigarettes, Pipe    Quit date: 06/13/2021    Years since quitting: 1.1   Smokeless tobacco: Never  Vaping Use   Vaping Use: Every day  Substance Use Topics   Alcohol use: Yes    Alcohol/week: 3.0 standard drinks of alcohol    Types: 3 Shots of liquor per week    Comment: 3 drinks per day   Drug use: Never      Allergies   Penicillins and Tape   Review of Systems Review of Systems PER HPI  Physical Exam Triage Vital Signs ED Triage Vitals [08/05/22 1559]  Enc Vitals Group     BP (!) 168/74     Pulse Rate 78     Resp 18     Temp 98.2 F (36.8 C)     Temp Source Oral     SpO2 95 %  Weight      Height      Head Circumference      Peak Flow      Pain Score 0     Pain Loc      Pain Edu?      Excl. in [place]?    No data found.  Updated Vital Signs BP (!) 168/74 (BP Location: Left Arm)   Pulse 78   Temp 98.2 F (36.8 C) (Oral)   Resp 18   SpO2 95%   Visual Acuity Right Eye Distance:   Left Eye Distance:   Bilateral Distance:    Right Eye Near:   Left Eye Near:    Bilateral Near:     Physical Exam Vitals and nursing note reviewed.  Constitutional:      Appearance: Normal appearance.  HENT:     Head: Atraumatic.  Eyes:     Extraocular Movements: Extraocular movements intact.     Conjunctiva/sclera: Conjunctivae normal.  Cardiovascular:     Rate and Rhythm: Normal rate and regular rhythm.  Pulmonary:     Effort: Pulmonary effort is normal.     Breath sounds: Normal breath sounds.  Musculoskeletal:        General: Normal range of motion.     Cervical back: Normal range of motion and neck supple.  Skin:    General: Skin is warm and dry.     Findings: Rash (erythematous macular patchy rash acrosrs b/l UEs and LEs, mildly on abdomen.) present.  Neurological:     General: No focal deficit present.     Mental Status: He is oriented to person, place, and time.  Psychiatric:        Mood and Affect: Mood normal.        Thought Content: Thought content normal.        Judgment: Judgment normal.      UC Treatments / Results  Labs (all labs ordered are listed, but only abnormal results are displayed) Labs Reviewed - No data to display  EKG   Radiology No results found.  Procedures Procedures (including critical care time)  Medications Ordered in  UC Medications  methylPREDNISolone sodium succinate (SOLU-MEDROL) 125 mg/2 mL injection 80 mg (80 mg Intramuscular Given 08/05/22 1626)    Initial Impression / Assessment and Plan / UC Course  I have reviewed the triage vital signs and the nursing notes.  Pertinent labs & imaging results that were available during my care of the patient were reviewed by me and considered in my medical decision making (see chart for details).     Reviewed labs from initial presentation of rash which were WNL. Not consistent with allergic rash, possibly vascular or inflammatory. Resolved with solumedrol previously, will provide an additional dose today and provide Dermatology resources. F/u with PCP for recheck   Final Clinical Impressions(s) / UC Diagnoses   Final diagnoses:  Rash   Discharge Instructions   None    ED Prescriptions   None    PDMP not reviewed this encounter.   Volney American, [place] 08/06/22 2051

## 2022-08-28 IMAGING — DX DG CHEST 1V PORT
1 series · 1 of 1 positions shown · non-contrast
Comparison: 06/14/2021

CLINICAL DATA: Short of breath.  Recent pneumonia

EXAM:
PORTABLE CHEST 1 VIEW

[chest ap]
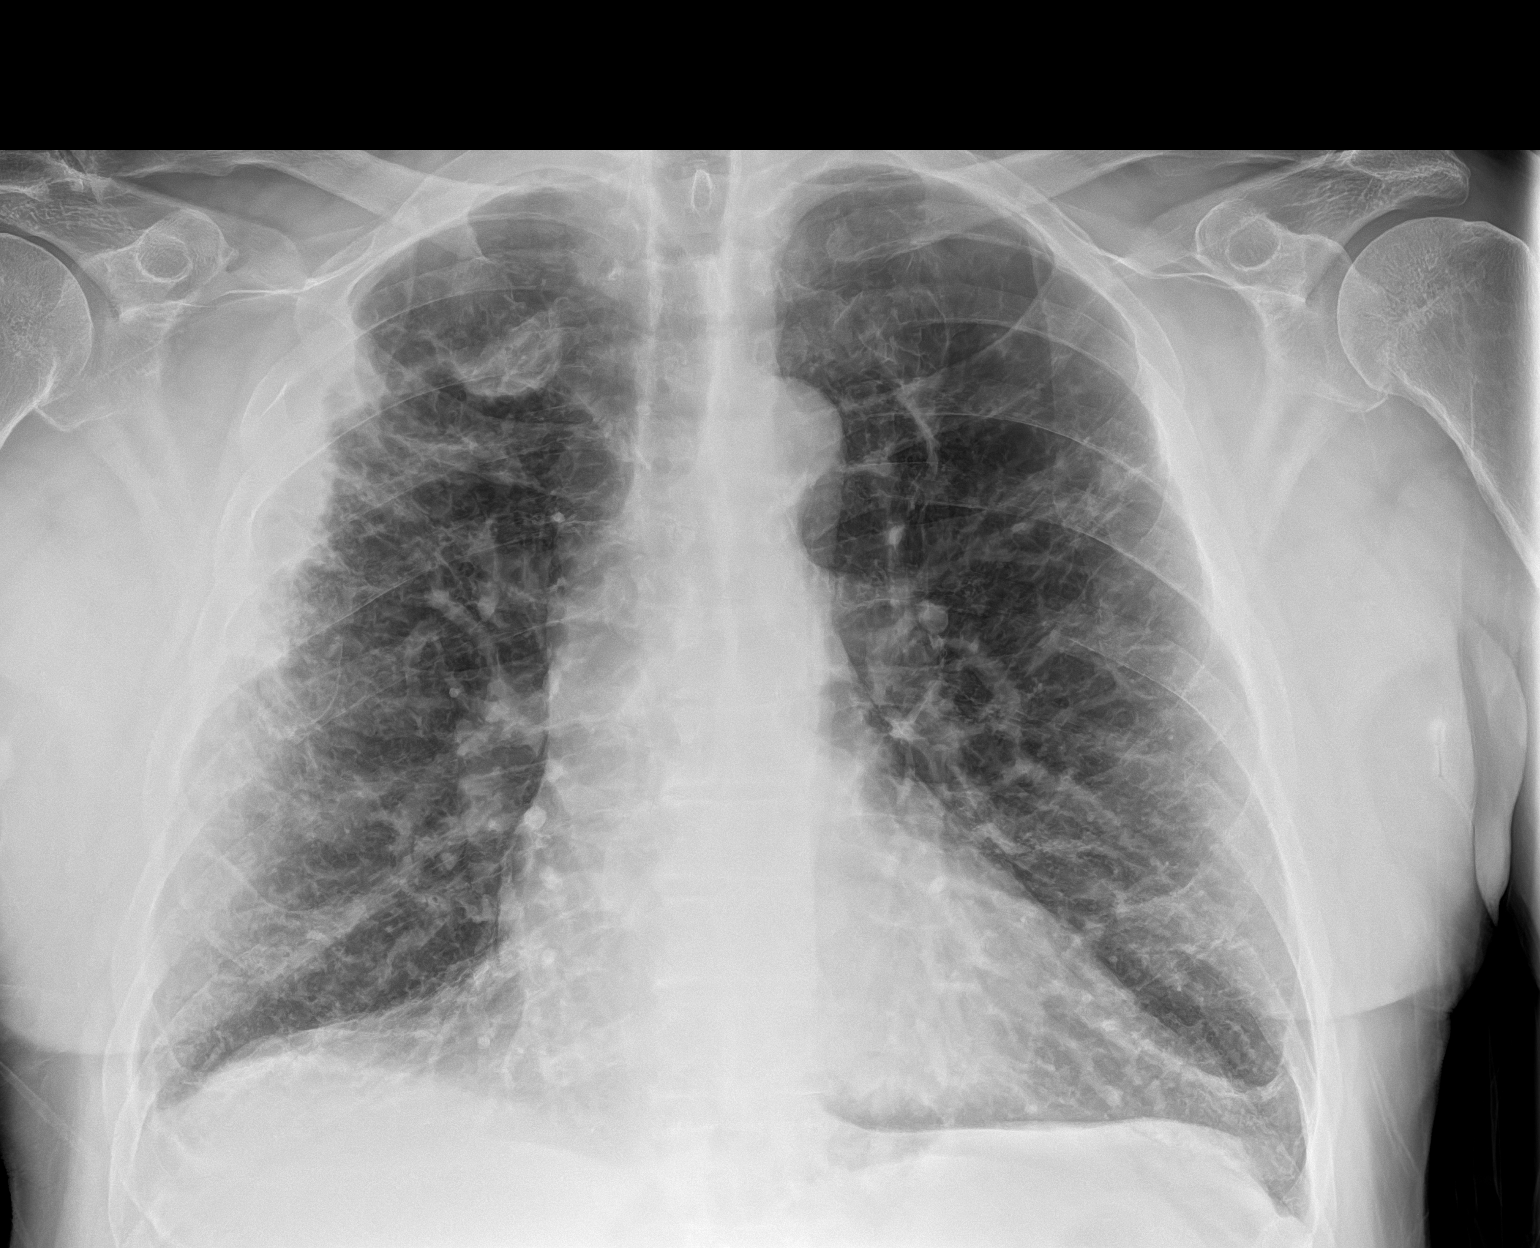

[1 of 1 positions shown; findings below may reference images not displayed]

FINDINGS: Underlying COPD with hyperinflation lungs and prominent lung
markings.

Progression of pleural based irregular thickening in the right upper
lobe laterally. Adjacent parenchymal airspace disease right upper
lobe is unchanged. 1 cm nodular density left upper lobe. No
pneumothorax. Possible small right pleural effusion
IMPRESSION: COPD

Progressive pleural based density right upper lobe. Patchy right
upper lobe airspace disease unchanged. Possible 1 cm left upper lobe
nodule.

Consider chest CT to rule out right upper lobe neoplasm versus
underlying pneumonia. Also attention left upper lobe nodule.

## 2022-09-07 ENCOUNTER — Other Ambulatory Visit: Payer: Self-pay | Admitting: Internal Medicine

## 2022-10-04 ENCOUNTER — Other Ambulatory Visit: Payer: Self-pay | Admitting: Internal Medicine

## 2022-10-21 ENCOUNTER — Other Ambulatory Visit: Payer: Self-pay | Admitting: Internal Medicine

## 2022-11-05 ENCOUNTER — Other Ambulatory Visit: Payer: Self-pay | Admitting: Internal Medicine

## 2022-11-08 ENCOUNTER — Inpatient Hospital Stay (HOSPITAL_COMMUNITY): Payer: Medicare HMO

## 2022-11-08 ENCOUNTER — Encounter (HOSPITAL_COMMUNITY): Payer: Self-pay

## 2022-11-08 ENCOUNTER — Emergency Department (HOSPITAL_COMMUNITY): Payer: Medicare HMO

## 2022-11-08 ENCOUNTER — Other Ambulatory Visit: Payer: Self-pay

## 2022-11-08 ENCOUNTER — Inpatient Hospital Stay (HOSPITAL_COMMUNITY)
Admission: EM | Admit: 2022-11-08 | Discharge: 2022-11-11 | DRG: 190 | Disposition: A | Discharge status: Home Health | Payer: Medicare HMO | Attending: Internal Medicine | Admitting: Internal Medicine

## 2022-11-08 DIAGNOSIS — K219 Gastro-esophageal reflux disease without esophagitis: Secondary | ICD-10-CM | POA: Diagnosis present

## 2022-11-08 DIAGNOSIS — Z7141 Alcohol abuse counseling and surveillance of alcoholic: Secondary | ICD-10-CM | POA: Diagnosis not present

## 2022-11-08 DIAGNOSIS — E869 Volume depletion, unspecified: Secondary | ICD-10-CM | POA: Diagnosis present

## 2022-11-08 DIAGNOSIS — J441 Chronic obstructive pulmonary disease with (acute) exacerbation: Secondary | ICD-10-CM | POA: Diagnosis present

## 2022-11-08 DIAGNOSIS — Z880 Allergy status to penicillin: Secondary | ICD-10-CM

## 2022-11-08 DIAGNOSIS — G4730 Sleep apnea, unspecified: Secondary | ICD-10-CM | POA: Diagnosis present

## 2022-11-08 DIAGNOSIS — I10 Essential (primary) hypertension: Secondary | ICD-10-CM | POA: Diagnosis present

## 2022-11-08 DIAGNOSIS — E871 Hypo-osmolality and hyponatremia: Secondary | ICD-10-CM | POA: Diagnosis present

## 2022-11-08 DIAGNOSIS — Z8249 Family history of ischemic heart disease and other diseases of the circulatory system: Secondary | ICD-10-CM | POA: Diagnosis not present

## 2022-11-08 DIAGNOSIS — F419 Anxiety disorder, unspecified: Secondary | ICD-10-CM | POA: Diagnosis present

## 2022-11-08 DIAGNOSIS — J9601 Acute respiratory failure with hypoxia: Secondary | ICD-10-CM | POA: Diagnosis present

## 2022-11-08 DIAGNOSIS — Z1152 Encounter for screening for COVID-19: Secondary | ICD-10-CM

## 2022-11-08 DIAGNOSIS — Z7982 Long term (current) use of aspirin: Secondary | ICD-10-CM

## 2022-11-08 DIAGNOSIS — Z791 Long term (current) use of non-steroidal anti-inflammatories (NSAID): Secondary | ICD-10-CM | POA: Diagnosis not present

## 2022-11-08 DIAGNOSIS — R262 Difficulty in walking, not elsewhere classified: Secondary | ICD-10-CM | POA: Diagnosis present

## 2022-11-08 DIAGNOSIS — R042 Hemoptysis: Secondary | ICD-10-CM | POA: Diagnosis present

## 2022-11-08 DIAGNOSIS — Z981 Arthrodesis status: Secondary | ICD-10-CM

## 2022-11-08 DIAGNOSIS — F1010 Alcohol abuse, uncomplicated: Secondary | ICD-10-CM | POA: Diagnosis present

## 2022-11-08 DIAGNOSIS — F17290 Nicotine dependence, other tobacco product, uncomplicated: Secondary | ICD-10-CM | POA: Diagnosis present

## 2022-11-08 DIAGNOSIS — Z79899 Other long term (current) drug therapy: Secondary | ICD-10-CM | POA: Diagnosis not present

## 2022-11-08 DIAGNOSIS — Z716 Tobacco abuse counseling: Secondary | ICD-10-CM | POA: Diagnosis not present

## 2022-11-08 DIAGNOSIS — Z96643 Presence of artificial hip joint, bilateral: Secondary | ICD-10-CM | POA: Diagnosis present

## 2022-11-08 DIAGNOSIS — Z9049 Acquired absence of other specified parts of digestive tract: Secondary | ICD-10-CM | POA: Diagnosis not present

## 2022-11-08 LAB — BASIC METABOLIC PANEL
Anion gap: 11 (ref 5–15)
BUN: 9 mg/dL (ref 8–23)
CO2: 24 mmol/L (ref 22–32)
Calcium: 8.6 mg/dL — ABNORMAL LOW (ref 8.9–10.3)
Chloride: 91 mmol/L — ABNORMAL LOW (ref 98–111)
Creatinine, Ser: 0.83 mg/dL (ref 0.61–1.24)
GFR, Estimated: >60 mL/min (ref >60)
Glucose, Bld: 97 mg/dL (ref 70–99)
Potassium: 4 mmol/L (ref 3.5–5.1)
Sodium: 126 mmol/L — ABNORMAL LOW (ref 135–145)

## 2022-11-08 LAB — MAGNESIUM: Magnesium: 2 mg/dL (ref 1.7–2.4)

## 2022-11-08 LAB — D-DIMER, QUANTITATIVE: D-Dimer, Quant: <0.27 ug/mL-FEU (ref 0.00–0.50)

## 2022-11-08 LAB — RESP PANEL BY RT-PCR (FLU A&B, COVID) ARPGX2
Influenza A by PCR: NEGATIVE
Influenza B by PCR: NEGATIVE
SARS Coronavirus 2 by RT PCR: NEGATIVE

## 2022-11-08 LAB — CBC WITH DIFFERENTIAL/PLATELET
Abs Immature Granulocytes: 0.15 10*3/uL — ABNORMAL HIGH (ref 0.00–0.07)
Basophils Absolute: 0.1 10*3/uL (ref 0.0–0.1)
Basophils Relative: 1 %
Eosinophils Absolute: 0.1 10*3/uL (ref 0.0–0.5)
Eosinophils Relative: 1 %
HCT: 45.3 % (ref 39.0–52.0)
Hemoglobin: 15.8 g/dL (ref 13.0–17.0)
Immature Granulocytes: 1 %
Lymphocytes Relative: 5 %
Lymphs Abs: 0.5 10*3/uL — ABNORMAL LOW (ref 0.7–4.0)
MCH: 33.9 pg (ref 26.0–34.0)
MCHC: 34.9 g/dL (ref 30.0–36.0)
MCV: 97.2 fL (ref 80.0–100.0)
Monocytes Absolute: 1.6 10*3/uL — ABNORMAL HIGH (ref 0.1–1.0)
Monocytes Relative: 15 %
Neutro Abs: 8.2 10*3/uL — ABNORMAL HIGH (ref 1.7–7.7)
Neutrophils Relative %: 77 %
Platelets: 290 10*3/uL (ref 150–400)
RBC: 4.66 MIL/uL (ref 4.22–5.81)
RDW: 12.3 % (ref 11.5–15.5)
WBC: 10.6 10*3/uL — ABNORMAL HIGH (ref 4.0–10.5)
nRBC: 0 % (ref 0.0–0.2)

## 2022-11-08 LAB — URINALYSIS, ROUTINE W REFLEX MICROSCOPIC
Bilirubin Urine: NEGATIVE
Glucose, UA: NEGATIVE mg/dL
Hgb urine dipstick: NEGATIVE
Ketones, ur: NEGATIVE mg/dL
Leukocytes,Ua: NEGATIVE
Nitrite: NEGATIVE
Protein, ur: NEGATIVE mg/dL
Specific Gravity, Urine: 1.008 (ref 1.005–1.030)
pH: 5 (ref 5.0–8.0)

## 2022-11-08 LAB — SODIUM, URINE, RANDOM: Sodium, Ur: 20 mmol/L

## 2022-11-08 LAB — TSH: TSH: 2.803 u[IU]/mL (ref 0.350–4.500)

## 2022-11-08 MED ORDER — ENOXAPARIN SODIUM 40 MG/0.4ML IJ SOSY
40.0000 mg | PREFILLED_SYRINGE | INTRAMUSCULAR | Status: DC
  Administered 2022-11-08 – 2022-11-10 (×3): 40 mg via SUBCUTANEOUS
  Filled 2022-11-08 (×3): qty 0.4

## 2022-11-08 MED ORDER — IOHEXOL 350 MG/ML SOLN
100.0000 mL | Freq: Once | INTRAVENOUS | Status: AC | PRN
  Administered 2022-11-08: 100 mL via INTRAVENOUS

## 2022-11-08 MED ORDER — PREDNISONE 20 MG PO TABS
40.0000 mg | ORAL_TABLET | Freq: Every day | ORAL | Status: DC
  Administered 2022-11-11: 40 mg via ORAL
  Filled 2022-11-08: qty 2

## 2022-11-08 MED ORDER — IPRATROPIUM-ALBUTEROL 0.5-2.5 (3) MG/3ML IN SOLN
3.0000 mL | Freq: Four times a day (QID) | RESPIRATORY_TRACT | Status: DC
  Administered 2022-11-09 (×4): 3 mL via RESPIRATORY_TRACT
  Filled 2022-11-08 (×4): qty 3

## 2022-11-08 MED ORDER — FOLIC ACID 1 MG PO TABS
1.0000 mg | ORAL_TABLET | Freq: Every day | ORAL | Status: DC
  Administered 2022-11-08 – 2022-11-11 (×4): 1 mg via ORAL
  Filled 2022-11-08 (×4): qty 1

## 2022-11-08 MED ORDER — IRBESARTAN 75 MG PO TABS
37.5000 mg | ORAL_TABLET | Freq: Every day | ORAL | Status: DC
  Administered 2022-11-09 – 2022-11-11 (×3): 37.5 mg via ORAL
  Filled 2022-11-08 (×3): qty 1

## 2022-11-08 MED ORDER — PANTOPRAZOLE SODIUM 40 MG PO TBEC
40.0000 mg | DELAYED_RELEASE_TABLET | Freq: Every day | ORAL | Status: DC
  Administered 2022-11-08 – 2022-11-10 (×3): 40 mg via ORAL
  Filled 2022-11-08 (×3): qty 1

## 2022-11-08 MED ORDER — SODIUM CHLORIDE 0.9 % IV SOLN
500.0000 mg | Freq: Once | INTRAVENOUS | Status: AC
  Administered 2022-11-08: 500 mg via INTRAVENOUS
  Filled 2022-11-08: qty 5

## 2022-11-08 MED ORDER — IPRATROPIUM BROMIDE 0.02 % IN SOLN
0.5000 mg | Freq: Once | RESPIRATORY_TRACT | Status: AC
  Administered 2022-11-08: 0.5 mg via RESPIRATORY_TRACT
  Filled 2022-11-08: qty 2.5

## 2022-11-08 MED ORDER — CHLORDIAZEPOXIDE HCL 5 MG PO CAPS
10.0000 mg | ORAL_CAPSULE | Freq: Three times a day (TID) | ORAL | Status: DC
  Administered 2022-11-08 – 2022-11-11 (×8): 10 mg via ORAL
  Filled 2022-11-08 (×8): qty 2

## 2022-11-08 MED ORDER — ASPIRIN 81 MG PO TBEC
81.0000 mg | DELAYED_RELEASE_TABLET | Freq: Every day | ORAL | Status: DC
  Administered 2022-11-09 – 2022-11-10 (×2): 81 mg via ORAL
  Filled 2022-11-08 (×2): qty 1

## 2022-11-08 MED ORDER — AMLODIPINE BESYLATE 5 MG PO TABS
10.0000 mg | ORAL_TABLET | Freq: Every day | ORAL | Status: DC
  Administered 2022-11-09: 10 mg via ORAL
  Filled 2022-11-08 (×2): qty 2

## 2022-11-08 MED ORDER — GUAIFENESIN ER 600 MG PO TB12
1200.0000 mg | ORAL_TABLET | Freq: Two times a day (BID) | ORAL | Status: DC
  Administered 2022-11-08 – 2022-11-11 (×6): 1200 mg via ORAL
  Filled 2022-11-08 (×6): qty 2

## 2022-11-08 MED ORDER — ADULT MULTIVITAMIN W/MINERALS CH
1.0000 | ORAL_TABLET | Freq: Every day | ORAL | Status: DC
  Administered 2022-11-08 – 2022-11-11 (×4): 1 via ORAL
  Filled 2022-11-08 (×4): qty 1

## 2022-11-08 MED ORDER — LORAZEPAM 2 MG/ML IJ SOLN
1.0000 mg | INTRAMUSCULAR | Status: DC | PRN
  Administered 2022-11-08: 2 mg via INTRAVENOUS
  Filled 2022-11-08: qty 1

## 2022-11-08 MED ORDER — ALBUTEROL (5 MG/ML) CONTINUOUS INHALATION SOLN
10.0000 mg/h | INHALATION_SOLUTION | Freq: Once | RESPIRATORY_TRACT | Status: AC
  Administered 2022-11-08: 10 mg/h via RESPIRATORY_TRACT
  Filled 2022-11-08: qty 20

## 2022-11-08 MED ORDER — DILTIAZEM HCL ER COATED BEADS 120 MG PO CP24
360.0000 mg | ORAL_CAPSULE | Freq: Every day | ORAL | Status: DC
  Administered 2022-11-09 – 2022-11-11 (×3): 360 mg via ORAL
  Filled 2022-11-08 (×3): qty 3

## 2022-11-08 MED ORDER — ALBUTEROL SULFATE (2.5 MG/3ML) 0.083% IN NEBU
2.5000 mg | INHALATION_SOLUTION | RESPIRATORY_TRACT | Status: DC | PRN
  Administered 2022-11-09 – 2022-11-11 (×2): 2.5 mg via RESPIRATORY_TRACT
  Filled 2022-11-08 (×2): qty 3

## 2022-11-08 MED ORDER — SODIUM CHLORIDE 0.9 % IV SOLN: INTRAVENOUS | Status: AC

## 2022-11-08 MED ORDER — UMECLIDINIUM BROMIDE 62.5 MCG/ACT IN AEPB
1.0000 | INHALATION_SPRAY | Freq: Every day | RESPIRATORY_TRACT | Status: DC
  Administered 2022-11-09 – 2022-11-10 (×2): 1 via RESPIRATORY_TRACT
  Filled 2022-11-08: qty 7

## 2022-11-08 MED ORDER — LORAZEPAM 1 MG PO TABS
1.0000 mg | ORAL_TABLET | ORAL | Status: DC | PRN
  Administered 2022-11-10: 1 mg via ORAL
  Filled 2022-11-08: qty 1

## 2022-11-08 MED ORDER — METHYLPREDNISOLONE SODIUM SUCC 125 MG IJ SOLR
80.0000 mg | Freq: Two times a day (BID) | INTRAMUSCULAR | Status: AC
  Administered 2022-11-08 – 2022-11-10 (×5): 80 mg via INTRAVENOUS
  Filled 2022-11-08 (×5): qty 2

## 2022-11-08 MED ORDER — MELATONIN 3 MG PO TABS
3.0000 mg | ORAL_TABLET | Freq: Every day | ORAL | Status: DC
  Administered 2022-11-08 – 2022-11-10 (×3): 3 mg via ORAL
  Filled 2022-11-08 (×3): qty 1

## 2022-11-08 MED ORDER — METHYLPREDNISOLONE SODIUM SUCC 125 MG IJ SOLR
125.0000 mg | Freq: Once | INTRAMUSCULAR | Status: AC
  Administered 2022-11-08: 125 mg via INTRAVENOUS
  Filled 2022-11-08: qty 2

## 2022-11-08 MED ORDER — IPRATROPIUM-ALBUTEROL 0.5-2.5 (3) MG/3ML IN SOLN
RESPIRATORY_TRACT | Status: AC
  Administered 2022-11-08: 3 mL via RESPIRATORY_TRACT
  Filled 2022-11-08: qty 3

## 2022-11-08 MED ORDER — ASPIRIN 81 MG PO TBEC: 81.0000 mg | DELAYED_RELEASE_TABLET | Freq: Two times a day (BID) | ORAL | Status: DC

## 2022-11-08 MED ORDER — THIAMINE MONONITRATE 100 MG PO TABS
100.0000 mg | ORAL_TABLET | Freq: Every day | ORAL | Status: DC
  Administered 2022-11-08 – 2022-11-11 (×4): 100 mg via ORAL
  Filled 2022-11-08 (×4): qty 1

## 2022-11-08 MED ORDER — THIAMINE HCL 100 MG/ML IJ SOLN: 100.0000 mg | Freq: Every day | INTRAMUSCULAR | Status: DC

## 2022-11-08 MED ORDER — TRAZODONE HCL 50 MG PO TABS
50.0000 mg | ORAL_TABLET | Freq: Every evening | ORAL | Status: DC | PRN
  Administered 2022-11-09: 50 mg via ORAL
  Filled 2022-11-08: qty 1

## 2022-11-08 MED ORDER — PANTOPRAZOLE SODIUM 40 MG PO TBEC: 40.0000 mg | DELAYED_RELEASE_TABLET | Freq: Every day | ORAL | Status: DC

## 2022-11-08 MED ORDER — SODIUM CHLORIDE 0.9 % IV SOLN
2.0000 g | INTRAVENOUS | Status: DC
  Administered 2022-11-09 – 2022-11-11 (×3): 2 g via INTRAVENOUS
  Filled 2022-11-08 (×3): qty 20

## 2022-11-08 MED ORDER — MAGNESIUM SULFATE 2 GM/50ML IV SOLN
2.0000 g | Freq: Once | INTRAVENOUS | Status: AC
  Administered 2022-11-08: 2 g via INTRAVENOUS
  Filled 2022-11-08: qty 50

## 2022-11-08 MED ORDER — SODIUM CHLORIDE 0.9 % IV SOLN
1.0000 g | Freq: Once | INTRAVENOUS | Status: AC
  Administered 2022-11-08: 1 g via INTRAVENOUS
  Filled 2022-11-08: qty 10

## 2022-11-08 MED ORDER — FLUTICASONE FUROATE-VILANTEROL 100-25 MCG/ACT IN AEPB
1.0000 | INHALATION_SPRAY | Freq: Every day | RESPIRATORY_TRACT | Status: DC
  Administered 2022-11-09 – 2022-11-10 (×2): 1 via RESPIRATORY_TRACT
  Filled 2022-11-08: qty 28

## 2022-11-08 NOTE — ED Provider Notes (Signed)
[facility] EMERGENCY DEPARTMENT Provider Note   CSN: [id number] Arrival date & time: 11/08/22  1152     History  Chief Complaint  Patient presents with   Shortness of Breath   HPI Jeremy [name] is a 71 y.o. male with COPD, hypertension and alcohol use for shortness of breath.  Stated on Sunday he had a sore throat and a cough was seen by an outside medical provider who prescribed him guanfacine/codeine and azithromycin.  Stated that provider was unsure if he had an actual infection but wanted to treat empirically.  Since then cough and shortness of breath have gotten progressively worse.  Patient does not have a oxygen requirement at home but today he is having difficulty walking to and from the bathroom without having to stop and catch his breath.  Denies chest pain.  Also states that he normally drinks 4-5 shots of whiskey every afternoon. But wife advised him not drink while taking the codeine.  Patient has not had a drink since Tuesday.  Patient also mention that he has not had much to eat in the last 3 to 4 days because the coughing has been so bad.   Shortness of Breath      Home Medications Prior to Admission medications   Medication Sig Start Date End Date Taking? Authorizing Provider  acetaminophen (TYLENOL) 500 MG tablet Take 2 tablets (1,000 mg total) by mouth every 6 (six) hours as needed for moderate pain or mild pain. 02/27/22  Yes Gawne, Meghan M, PA-C  Albuterol Sulfate (PROAIR HFA IN) Inhale 2 puffs into the lungs daily as needed (shortness of breath).   Yes [provider]  ALPRAZolam (XANAX) 0.5 MG tablet Take 0.25-0.5 mg by mouth 3 (three) times daily as needed for anxiety.   Yes [provider]  amLODipine (NORVASC) 10 MG tablet Take 10 mg by mouth daily. 07/28/17  Yes [provider]  aspirin EC 81 MG tablet Take 1 tablet (81 mg total) by mouth 2 (two) times daily. For DVT prophylaxis for 30 days after surgery. Patient taking  differently: Take 81 mg by mouth daily. . 02/27/22  Yes Gawne, Meghan M, PA-C  azithromycin (ZITHROMAX) 250 MG tablet Take 250 mg by mouth See admin instructions. Start with 2 tablets and then 1 tablet daily until complete. 11/05/22  Yes [provider]  Cholecalciferol (VITAMIN D) 50 MCG (2000 UT) CAPS Take 4,000 Units by mouth daily.   Yes [provider]  cyanocobalamin (,VITAMIN B-12,) 1000 MCG/ML injection Inject 1,000 mcg into the muscle every 30 (thirty) days.   Yes [provider]  diltiazem (CARDIZEM CD) 360 MG 24 hr capsule Take 360 mg by mouth daily. 02/06/22  Yes [provider]  Fluticasone-Umeclidin-Vilant (TRELEGY ELLIPTA) 100-62.5-25 MCG/ACT AEPB INHALE 1 PUFF INTO THE LUNGS FIRST THING EACH MORNING. 10/21/22  Yes Tanda Rockers, MD  furosemide (LASIX) 20 MG tablet Take 20 mg by mouth daily as needed. 10/21/22  Yes [provider]  Glucosamine HCl (GLUCOSAMINE PO) Take 2 tablets by mouth daily.   Yes [provider]  guaiFENesin-codeine 100-10 MG/5ML syrup Take 10 mLs by mouth every 6 (six) hours as needed. 11/05/22  Yes [provider]  loperamide (IMODIUM) 2 MG capsule Take 1 capsule (2 mg total) by mouth 4 (four) times daily as needed for diarrhea or loose stools. 03/01/22  Yes Davonna Belling, MD  meloxicam (MOBIC) 7.5 MG tablet Take 7.5 mg by mouth daily. 07/08/17  Yes [provider]  olmesartan (BENICAR) 40 MG tablet TAKE ONE TABLET BY MOUTH ONCE DAILY. 11/06/22  Yes Tanda Rockers, MD  pantoprazole (PROTONIX) 40 MG tablet Take 40 mg by mouth daily. 07/10/21  Yes [provider]  potassium chloride SA (KLOR-CON M) 20 MEQ tablet Take 20 mEq by mouth as needed. 02/06/22  Yes [provider]  TURMERIC PO Take 1 capsule by mouth daily.   Yes [provider]  vitamin E 180 MG (400 UNITS) capsule Take 400 Units by mouth daily.   Yes [provider]  fluocinolone (VANOS) 0.01 % cream  SMARTSIG:sparingly Topical Daily Patient not taking: Reported on 11/08/2022 08/07/22   [provider]  hydrochlorothiazide (HYDRODIURIL) 25 MG tablet Take 25 mg by mouth daily. Patient not taking: Reported on 11/08/2022 08/07/22   [provider]      Allergies    Penicillins and Tape    Review of Systems   Review of Systems  Respiratory:  Positive for shortness of breath.     Physical Exam Updated Vital Signs BP (!) 125/91   Pulse 88   Temp 98.2 F (36.8 C) (Oral)   Resp (!) 22   Ht 5\' 9"  (1.753 m)   Wt 88.9 kg   SpO2 98%   BMI 28.94 kg/m  Physical Exam Vitals and nursing note reviewed.  Constitutional:      Comments: tremulous  HENT:     Head: Normocephalic and atraumatic.     Mouth/Throat:     Mouth: Mucous membranes are moist.  Eyes:     General:        Right eye: No discharge.        Left eye: No discharge.     Conjunctiva/sclera: Conjunctivae normal.  Cardiovascular:     Rate and Rhythm: Normal rate and regular rhythm.     Pulses: Normal pulses.     Heart sounds: Normal heart sounds.  Pulmonary:     Effort: Pulmonary effort is normal.     Breath sounds: Decreased breath sounds, wheezing and rhonchi present. No rales.  Abdominal:     General: Abdomen is flat.     Palpations: Abdomen is soft.  Skin:    General: Skin is warm and dry.  Neurological:     General: No focal deficit present.  Psychiatric:        Mood and Affect: Mood normal.     ED Results / Procedures / Treatments   Labs (all labs ordered are listed, but only abnormal results are displayed) Labs Reviewed  BASIC METABOLIC PANEL - Abnormal; Notable for the following components:      Result Value   Sodium 126 (*)    Chloride 91 (*)    Calcium 8.6 (*)    All other components within normal limits  CBC WITH DIFFERENTIAL/PLATELET - Abnormal; Notable for the following components:   WBC 10.6 (*)    Neutro Abs 8.2 (*)    Lymphs Abs 0.5 (*)    Monocytes Absolute 1.6 (*)    Abs  Immature Granulocytes 0.15 (*)    All other components within normal limits  RESP PANEL BY RT-PCR (FLU A&B, COVID) ARPGX2  D-DIMER, QUANTITATIVE  URINALYSIS, ROUTINE W REFLEX MICROSCOPIC  MAGNESIUM  TSH  SODIUM, URINE, RANDOM  OSMOLALITY, URINE  OSMOLALITY    EKG None  Radiology DG Chest Port 1 View  Result Date: 11/08/2022 CLINICAL DATA:  Shortness of breath EXAM: PORTABLE CHEST 1 VIEW COMPARISON:  Chest x-ray dated June 27, 2021 FINDINGS:  The heart size and mediastinal contours are within normal limits. Background emphysema. No consolidation. Right upper lobe pleural-based density and left upper lobe nodular opacity seen on prior radiograph have resolved. The visualized skeletal structures are unremarkable. IMPRESSION: Background emphysema with no evidence of acute airspace opacity. Electronically Signed   By: Yetta Glassman M.D.   On: 11/08/2022 12:31    Procedures Procedures    Medications Ordered in ED Medications  cefTRIAXone (ROCEPHIN) 1 g in sodium chloride 0.9 % 100 mL IVPB (has no administration in time range)  azithromycin (ZITHROMAX) 500 mg in sodium chloride 0.9 % 250 mL IVPB (has no administration in time range)  albuterol (PROVENTIL,VENTOLIN) solution continuous neb (10 mg/hr Nebulization Given 11/08/22 1302)  ipratropium (ATROVENT) nebulizer solution 0.5 mg (0.5 mg Nebulization Given 11/08/22 1302)  magnesium sulfate IVPB 2 g 50 mL (0 g Intravenous Stopped 11/08/22 1400)  methylPREDNISolone sodium succinate (SOLU-MEDROL) 125 mg/2 mL injection 125 mg (125 mg Intravenous Given 11/08/22 1250)    ED Course/ Medical Decision Making/ A&P                           Medical Decision Making Amount and/or Complexity of Data Reviewed Labs: ordered. Radiology: ordered.  Risk Prescription drug management.   Initial Impression and Ddx 71 yo male ill appearing and hemodynamically stable presenting for shortness of breath. Physical exam noted for diffuse expiratory  wheezing and rhonchi worse breath sounds. Differential diagnosis includes COPD exacerbation, pneumonia, COVID and flu, ACS and PE. Patient PMH that increases complexity of ED encounter: COPD, tobacco use disorder and alcohol use disorder  Interpretation of Diagnostics I independent reviewed and interpreted the labs as followed: Hyponatremia, leukocytosis  - I independently visualized the following imaging with scope of interpretation limited to determining acute life threatening conditions related to emergency care: CXR, which revealed background emphysema but no acute focal opacity  - I personally reviewed and interpreted EKG which revealed NSR  Patient Reassessment and Ultimate Disposition/Management Treated for COPD exacerbation with steroids, albuterol, ipratropium, and magnesium. Was also concerned for pneumonia given persistence of his cough, SOB, and elevated WBC. Treated with rocephin and azithromycin. Patient stated he felt better but still SOB.  Ambulated patient around the unit and patient desatted to the mid 80s.  At this point, placed him on 2 L nasal cannula.  Patient also endorsed weakness. BMP revealed a was hyponatremic which is likely due to poor nutrition given that he has not eaten hardly anything last 4 days.  Admitted to hospital service for COPD exacerbation, new oxygen requirement,and ongoing hyponatremia.  Of note, patient was also tremulous on exam, placed on CIWA protocol.  Patient management required discussion with the following services or consulting groups:  Hospitalist Service  Complexity of Problems Addressed Chronic illness with exacerbation  Additional Data Reviewed and Analyzed Further history obtained from: Further history from spouse/family member, Prior ED visit notes, and Recent discharge summary  Patient Encounter Risk Assessment Consideration of hospitalization         Final Clinical Impression(s) / ED Diagnoses Final diagnoses:  COPD  exacerbation ([place])  Hyponatremia    Rx / DC Orders ED Discharge Orders     None         Harriet Pho, PA-C 11/08/22 1909    Fredia Sorrow, MD 11/16/22 1720

## 2022-11-08 NOTE — H&P (Signed)
TRH H&P   Patient Demographics:    Jeremy [name], is a 71 y.o. male  MRN: [id number]   DOB - 1951/08/12  Admit Date - 11/08/2022  Outpatient Primary MD for the patient is Redmond School, MD  Referring MD/NP/PA: PA Quentin Cornwall    Patient coming from: home  Chief Complaint  Patient presents with   Shortness of Breath      HPI:    Jeremy [name]  is a 71 y.o. male, with past medical history of COPD, hypertension, alcohol abuse, he presents to ED secondary to dyspnea, reports over the last 5 to 7 days he started to have some dyspnea, cough, sore throat, was prescribed azithromycin, guaifenesin/codeine, without much improvement of his symptoms, reports he was taking Tylenol, unsure if he had any fever, reports poor appetite, significant wheezing, cough, productive sputum, blood-streaked sometimes, reported drinks 4-5 shots of whiskey every afternoon, reports he recently stopped smoking but started to vape instead. -In ED patient was hypoxic 85% on room air, CTA chest with no evidence of PE, or pneumonia, he had significant wheezing improved with steroids, but remains hypoxic on 2 L nasal cannula, [facility] hospitalist consulted to admit    Review of systems:     A full 10 point Review of Systems was done, except as stated above, all other Review of Systems were negative.   With Past History of the following :    Past Medical History:  Diagnosis Date   Alcohol use    daily   Anxiety    hx (05/05/2018)   Arthritis    "all over" (05/05/2018)   Asthma    Chronic bronchitis (HCC)    COPD (chronic obstructive pulmonary disease) (HCC)    Dyspnea    Family history of adverse reaction to anesthesia    "mom would have nausea after OR"    GERD (gastroesophageal reflux disease)    History of hiatal hernia    Hypertension    Pneumonia X 1   PONV (postoperative nausea and vomiting)     Seasonal allergies    "spring, summer, fall"   Sleep apnea       Past Surgical History:  Procedure Laterality Date   ANTERIOR CERVICAL DECOMP/DISCECTOMY FUSION Left    BACK SURGERY     COLONOSCOPY     JOINT REPLACEMENT     LAPAROSCOPIC CHOLECYSTECTOMY     LUMBAR DISC SURGERY  X 2   L4-5   NASAL POLYP EXCISION  ~ [place] Left 05/05/2018   TOTAL HIP ARTHROPLASTY Left 05/05/2018   Procedure: LEFT TOTAL HIP ARTHROPLASTY ANTERIOR APPROACH;  Surgeon: [name], MD;  Location: [facility];  Service: Orthopedics;  Laterality: Left;   TOTAL HIP ARTHROPLASTY Right 02/26/2022   Procedure: TOTAL HIP ARTHROPLASTY ANTERIOR APPROACH;  Surgeon: [name], MD;  Location: [facility];  Service:  Orthopedics;  Laterality: Right;      Social History:     Social History   Tobacco Use   Smoking status: Former    Packs/day: 1.00    Years: 60.00    Total pack years: 60.00    Types: Cigarettes, Pipe    Quit date: 06/13/2021    Years since quitting: 1.4   Smokeless tobacco: Never  Substance Use Topics   Alcohol use: Yes    Alcohol/week: 3.0 standard drinks of alcohol    Types: 3 Shots of liquor per week    Comment: 3 drinks per day       Family History :     Family History  Problem Relation Age of Onset   Heart disease Mother    Heart disease Father       Home Medications:   Prior to Admission medications   Medication Sig Start Date End Date Taking? Authorizing Provider  acetaminophen (TYLENOL) 500 MG tablet Take 2 tablets (1,000 mg total) by mouth every 6 (six) hours as needed for moderate pain or mild pain. 02/27/22  Yes Gawne, Meghan M, PA-C  Albuterol Sulfate (PROAIR HFA IN) Inhale 2 puffs into the lungs daily as needed (shortness of breath).   Yes [provider]  ALPRAZolam (XANAX) 0.5 MG tablet Take 0.25-0.5 mg by mouth 3 (three) times daily as needed for anxiety.   Yes [provider]  amLODipine (NORVASC) 10 MG  tablet Take 10 mg by mouth daily. 07/28/17  Yes [provider]  aspirin EC 81 MG tablet Take 1 tablet (81 mg total) by mouth 2 (two) times daily. For DVT prophylaxis for 30 days after surgery. Patient taking differently: Take 81 mg by mouth daily. . 02/27/22  Yes Gawne, Meghan M, PA-C  azithromycin (ZITHROMAX) 250 MG tablet Take 250 mg by mouth See admin instructions. Start with 2 tablets and then 1 tablet daily until complete. 11/05/22  Yes [provider]  Cholecalciferol (VITAMIN D) 50 MCG (2000 UT) CAPS Take 4,000 Units by mouth daily.   Yes [provider]  cyanocobalamin (,VITAMIN B-12,) 1000 MCG/ML injection Inject 1,000 mcg into the muscle every 30 (thirty) days.   Yes [provider]  diltiazem (CARDIZEM CD) 360 MG 24 hr capsule Take 360 mg by mouth daily. 02/06/22  Yes [provider]  Fluticasone-Umeclidin-Vilant (TRELEGY ELLIPTA) 100-62.5-25 MCG/ACT AEPB INHALE 1 PUFF INTO THE LUNGS FIRST THING EACH MORNING. 10/21/22  Yes Tanda Rockers, MD  furosemide (LASIX) 20 MG tablet Take 20 mg by mouth daily as needed. 10/21/22  Yes [provider]  Glucosamine HCl (GLUCOSAMINE PO) Take 2 tablets by mouth daily.   Yes [provider]  guaiFENesin-codeine 100-10 MG/5ML syrup Take 10 mLs by mouth every 6 (six) hours as needed. 11/05/22  Yes [provider]  loperamide (IMODIUM) 2 MG capsule Take 1 capsule (2 mg total) by mouth 4 (four) times daily as needed for diarrhea or loose stools. 03/01/22  Yes Davonna Belling, MD  meloxicam (MOBIC) 7.5 MG tablet Take 7.5 mg by mouth daily. 07/08/17  Yes [provider]  olmesartan (BENICAR) 40 MG tablet TAKE ONE TABLET BY MOUTH ONCE DAILY. 11/06/22  Yes Tanda Rockers, MD  pantoprazole (PROTONIX) 40 MG tablet Take 40 mg by mouth daily. 07/10/21  Yes [provider]  potassium chloride SA (KLOR-CON M) 20 MEQ tablet Take 20 mEq by mouth as needed. 02/06/22  Yes [provider]  TURMERIC PO Take 1 capsule by  mouth daily.   Yes [provider]  vitamin E 180 MG (400 UNITS) capsule Take 400 Units by mouth daily.   Yes [provider]  fluocinolone (VANOS) 0.01 % cream SMARTSIG:sparingly Topical Daily Patient not taking: Reported on 11/08/2022 08/07/22   [provider]  hydrochlorothiazide (HYDRODIURIL) 25 MG tablet Take 25 mg by mouth daily. Patient not taking: Reported on 11/08/2022 08/07/22   [provider]     Allergies:     Allergies  Allergen Reactions   Penicillins Other (See Comments)    Has patient had a PCN reaction causing immediate rash, facial/tongue/throat swelling, SOB or lightheadedness with hypotension:##Yes# but pt states he is ok with amoxicillin. Has patient had a PCN reaction causing severe rash involving mucus membranes or skin necrosis: No Has patient had a PCN reaction that required hospitalization: No Has patient had a PCN reaction occurring within the last 10 years: No If all of the above answers are "NO", then may proceed with Cephalosporin use.     Tape Other (See Comments)    Skin tears and bruises-use paper tape     Physical Exam:   Vitals  Blood pressure (!) 125/91, pulse 81, temperature 98.2 F (36.8 C), temperature source Oral, resp. rate (!) 23, height 5\' 9"  (1.753 m), weight 88.9 kg, SpO2 97 %.   1. General W male, laying in bed, no apparent distress, frail  2. Normal affect and insight, Not Suicidal or Homicidal, Awake Alert, Oriented X 3.  3. No F.N deficits, ALL C.Nerves Intact, Strength 5/5 all 4 extremities, Sensation intact all 4 extremities, Plantars down going.  4. Ears and Eyes appear Normal, Conjunctivae clear, PERRLA. Moist Oral Mucosa.  5. Supple Neck, No JVD, No cervical lymphadenopathy appriciated, No Carotid Bruits.  6. Symmetrical Chest wall movement, Minister entry bilaterally with diffuse wheezing 7. RRR, No Gallops, Rubs or Murmurs, No Parasternal Heave.  8.  Positive Bowel Sounds, Abdomen Soft, No tenderness, No organomegaly appriciated,No rebound -guarding or rigidity.  9.  No Cyanosis, Normal Skin Turgor, No Skin Rash or Bruise.  10. Good muscle tone,  joints appear normal , no effusions, Normal ROM.   Data Review:    CBC Recent Labs  Lab 11/08/22 1244  WBC 10.6*  HGB 15.8  HCT 45.3  PLT 290  MCV 97.2  MCH 33.9  MCHC 34.9  RDW 12.3  LYMPHSABS 0.5*  MONOABS 1.6*  EOSABS 0.1  BASOSABS 0.1   ------------------------------------------------------------------------------------------------------------------  Chemistries  Recent Labs  Lab 11/08/22 1244  NA 126*  K 4.0  CL 91*  CO2 24  GLUCOSE 97  BUN 9  CREATININE 0.83  CALCIUM 8.6*  MG 2.0   ------------------------------------------------------------------------------------------------------------------ estimated creatinine clearance is 87.4 mL/min (by C-G formula based on SCr of 0.83 mg/dL). ------------------------------------------------------------------------------------------------------------------ Recent Labs    11/08/22 1244  TSH 2.803    Coagulation profile No results for input(s): "INR", "PROTIME" in the last 168 hours. ------------------------------------------------------------------------------------------------------------------- Recent Labs    11/08/22 1335  DDIMER <0.27   -------------------------------------------------------------------------------------------------------------------  Cardiac Enzymes No results for input(s): "CKMB", "TROPONINI", "MYOGLOBIN" in the last 168 hours.  Invalid input(s): "CK" ------------------------------------------------------------------------------------------------------------------ No results found for: "BNP"   ---------------------------------------------------------------------------------------------------------------  Urinalysis    Component Value Date/Time   COLORURINE YELLOW 11/08/2022 [place] 11/08/2022 1745   LABSPEC 1.008 11/08/2022 1745   PHURINE 5.0 11/08/2022 1745   GLUCOSEU NEGATIVE 11/08/2022 1745   HGBUR NEGATIVE 11/08/2022 1745   BILIRUBINUR NEGATIVE 11/08/2022 1745   KETONESUR NEGATIVE  11/08/2022 1745   PROTEINUR NEGATIVE 11/08/2022 1745   NITRITE NEGATIVE 11/08/2022 1745   LEUKOCYTESUR NEGATIVE 11/08/2022 1745    ----------------------------------------------------------------------------------------------------------------   Imaging Results:    CT Angio Chest Pulmonary Embolism (PE) W or WO Contrast  Result Date: 11/08/2022 CLINICAL DATA:  Increased shortness of breath for several days, initial encounter EXAM: CT ANGIOGRAPHY CHEST WITH CONTRAST TECHNIQUE: Multidetector CT imaging of the chest was performed using the standard protocol during bolus administration of intravenous contrast. Multiplanar CT image reconstructions and MIPs were obtained to evaluate the vascular anatomy. RADIATION DOSE REDUCTION: This exam was performed according to the departmental dose-optimization program which includes automated exposure control, adjustment of the mA and/or kV according to patient size and/or use of iterative reconstruction technique. CONTRAST:  133mL OMNIPAQUE IOHEXOL 350 MG/ML SOLN COMPARISON:  Chest x-ray from earlier in the same day, CT from 04/30/2022 FINDINGS: Cardiovascular: Atherosclerotic calcifications of the thoracic aorta are noted. No aneurysmal dilatation or dissection is noted. Mild coronary calcifications are seen. No cardiac enlargement is noted. Pulmonary artery is well visualized within normal branching pattern. No filling defect to suggest pulmonary embolism is noted. Mediastinum/Nodes: Thoracic inlet is within normal limits. No hilar or mediastinal adenopathy is noted. The esophagus as visualized is within normal limits. Lungs/Pleura: Lungs are well aerated bilaterally. Diffuse emphysematous changes are seen. Chronic areas of scarring are  again seen in the upper lobes slightly more prominent on the right than the left but stable in appearance. Stable 3 mm pulmonary nodule is noted in the lateral aspect of the left lower lobe best noted on image number 117 of series 6. Upper Abdomen: Visualized upper abdomen shows changes of prior cholecystectomy. No other focal abnormality is seen. Musculoskeletal: Degenerative changes of the thoracic spine are noted. No acute rib abnormality is seen. Review of the MIP images confirms the above findings. IMPRESSION: No evidence of pulmonary embolism. Chronic scarring bilaterally. Stable 3 mm left lower lobe pulmonary nodule. No follow-up needed. This recommendation follows the consensus statement: Guidelines for Management of Incidental Pulmonary Nodules Detected on CT Images: From the Fleischner Society 2017; Radiology 2017; 284:228-243. Aortic Atherosclerosis (ICD10-I70.0) and Emphysema (ICD10-J43.9). Electronically Signed   By: Inez Catalina M.D.   On: 11/08/2022 19:45   DG Chest Port 1 View  Result Date: 11/08/2022 CLINICAL DATA:  Shortness of breath EXAM: PORTABLE CHEST 1 VIEW COMPARISON:  Chest x-ray dated June 27, 2021 FINDINGS: The heart size and mediastinal contours are within normal limits. Background emphysema. No consolidation. Right upper lobe pleural-based density and left upper lobe nodular opacity seen on prior radiograph have resolved. The visualized skeletal structures are unremarkable. IMPRESSION: Background emphysema with no evidence of acute airspace opacity. Electronically Signed   By: Yetta Glassman M.D.   On: 11/08/2022 12:31       Assessment & Plan:    Principal Problem:   COPD exacerbation ([place]) Active Problems:   Essential hypertension   Acute hypoxemic respiratory failure (HCC)   Hyponatremia   Acute hypoxic respiratory failure COPD exacerbation -Patient presents with dyspnea, significant wheezing, cough with productive phlegm -He is on room air at baseline,  saturating 85% on room air, currently on 2 L nasal cannula -Imaging  with no evidence of pneumonia -Endorses hemoptysis, most likely related to cough, obtain CTA chest, no evidence of PE -To be started on IV Solu-Medrol, scheduled DuoNebs, as needed albuterol, given productive cough, will start on IV Rocephin and azithromycin. -Will be encouraged to use incentive spirometry and flutter valve. -He  will need home oxygen on discharge  Hyponatremia -Due to volume depletion, as clinically dry from poor appetite over the last 3 days, keep on gentle hydration and hold hydrochlorothiazide    Alcohol abuse -will keep on CIWA protocol - will start on low dose scheduled librium   History of tobacco use  - Patient reports he is smoking no more, but currently vaping.  GERD -continue with PPI-  Hypertension -Continue with home medication  DVT Prophylaxis   Lovenox  AM Labs Ordered, also please review Full Orders  Family Communication: Admission, patients condition and plan of care including tests being ordered have been discussed with the patient and wife  who indicate understanding and agree with the plan and Code Status.  Code Status Full  Likely DC to  home  Condition GUARDED    Consults called: none    Admission status: inpatient    Time spent in minutes : 70 minutes   Phillips Climes M.D on 11/08/2022 at 9:04 PM   [facility] - Office  [phone]

## 2022-11-08 NOTE — ED Triage Notes (Signed)
  Pt presents with ShOB that with gradual worsening that started 5 days ago. Pt reports associated fever, sore thoat, body aches, and today pt noted bright red blood in his sputum. Pt with hx of CHF. Pt able to speak on complete sentences between breaths during triage.

## 2022-11-08 NOTE — ED Notes (Signed)
Walked pt and he did not feel comfortable waking without a walker. He walked around the ER with the walker and the O2 sat got into the 70s. I immediately put him in bed and hooked him up to oxygen.

## 2022-11-09 DIAGNOSIS — I10 Essential (primary) hypertension: Secondary | ICD-10-CM | POA: Diagnosis not present

## 2022-11-09 DIAGNOSIS — J441 Chronic obstructive pulmonary disease with (acute) exacerbation: Secondary | ICD-10-CM | POA: Diagnosis not present

## 2022-11-09 DIAGNOSIS — E871 Hypo-osmolality and hyponatremia: Secondary | ICD-10-CM | POA: Diagnosis not present

## 2022-11-09 DIAGNOSIS — J9601 Acute respiratory failure with hypoxia: Secondary | ICD-10-CM | POA: Diagnosis not present

## 2022-11-09 LAB — CBC
HCT: 44.5 % (ref 39.0–52.0)
Hemoglobin: 15.6 g/dL (ref 13.0–17.0)
MCH: 33.9 pg (ref 26.0–34.0)
MCHC: 35.1 g/dL (ref 30.0–36.0)
MCV: 96.7 fL (ref 80.0–100.0)
Platelets: 309 10*3/uL (ref 150–400)
RBC: 4.6 MIL/uL (ref 4.22–5.81)
RDW: 12.1 % (ref 11.5–15.5)
WBC: 8.3 10*3/uL (ref 4.0–10.5)
nRBC: 0 % (ref 0.0–0.2)

## 2022-11-09 LAB — OSMOLALITY, URINE: Osmolality, Ur: 251 mOsm/kg — ABNORMAL LOW (ref 300–900)

## 2022-11-09 LAB — BASIC METABOLIC PANEL
Anion gap: 9 (ref 5–15)
BUN: 11 mg/dL (ref 8–23)
CO2: 24 mmol/L (ref 22–32)
Calcium: 8.5 mg/dL — ABNORMAL LOW (ref 8.9–10.3)
Chloride: 95 mmol/L — ABNORMAL LOW (ref 98–111)
Creatinine, Ser: 0.82 mg/dL (ref 0.61–1.24)
GFR, Estimated: >60 mL/min (ref >60)
Glucose, Bld: 130 mg/dL — ABNORMAL HIGH (ref 70–99)
Potassium: 4.2 mmol/L (ref 3.5–5.1)
Sodium: 128 mmol/L — ABNORMAL LOW (ref 135–145)

## 2022-11-09 LAB — OSMOLALITY: Osmolality: 262 mOsm/kg — ABNORMAL LOW (ref 275–295)

## 2022-11-09 LAB — GLUCOSE, CAPILLARY: Glucose-Capillary: 175 mg/dL — ABNORMAL HIGH (ref 70–99)

## 2022-11-09 MED ORDER — SODIUM CHLORIDE 0.9 % IV SOLN: INTRAVENOUS | Status: DC

## 2022-11-09 NOTE — Progress Notes (Signed)
PROGRESS NOTE    Jeremy [name]  ZOX:[id number] DOB: 1951-07-07 DOA: 11/08/2022 PCP: Redmond School, MD    Chief Complaint  Patient presents with   Shortness of Breath    Brief Narrative:   Jeremy [name]  is a 71 y.o. male, with past medical history of COPD, hypertension, alcohol abuse, he presents to ED secondary to dyspnea, and cough, his workup significant for COPD exacerbation and new oxygen requirement, he was admitted for further management.  .  Assessment & Plan:   Acute hypoxic respiratory failure COPD exacerbation -Patient presents with dyspnea, significant wheezing, cough with productive phlegm -85% on room air initially, on 2 L nasal cannula, he remains with oxygen requirement this morning, will wean as tolerated.   -Endorses hemoptysis, most likely related to cough, obtain CTA chest, no evidence of PE -No evidence of pneumonia on imaging. -Dyspnea and wheezing has improved, but remains significant, as well remains on oxygen requirement, will continue with current dose IV Solu-Medrol. -Continue with scheduled DuoNebs and as needed albuterol. -Significant productive yellow phlegm, started on IV Rocephin and azithromycin. -Will be encouraged to use incentive spirometry and flutter valve. -Well assess need for home oxygen prior to discharge   Hyponatremia -Due to volume depletion, proving, continue with NS -Continue to hold hydrochlorothiazide.   Alcohol abuse -will keep on CIWA protocol -Sinew with low-dose Librium   History of tobacco use  - Patient reports he is smoking no more, but currently vaping.  He was counseled   GERD -continue with PPI-   Hypertension -Continue with home medication, but will hold hydrochlorothiazide due to hyponatremia    DVT prophylaxis: (Lovenox) Code Status: (Full) Family Communication: None at bedside Disposition:   Status is: Inpatient    Consultants:  none  Subjective:  He denies any chest pain, reports  dyspnea and cough has improved.  Objective: Vitals:   11/09/22 0745 11/09/22 0755 11/09/22 0758 11/09/22 0822  BP: 134/65 134/65  138/72  Pulse:  87  91  Resp:  19  20  Temp:   98.1 F (36.7 C) 98.3 F (36.8 C)  TempSrc:   Oral Oral  SpO2:  96%  98%  Weight:    86 kg  Height:    5\' 9"  (1.753 m)    Intake/Output Summary (Last 24 hours) at 11/09/2022 1052 Last data filed at 11/08/2022 2040 Gross per 24 hour  Intake 144.27 ml  Output 480 ml  Net -335.73 ml   Filed Weights   11/08/22 1205 11/09/22 0822  Weight: 88.9 kg 86 kg    Examination:  Awake Alert, Oriented X 3, he is with some tremors Symmetrical Chest wall movement, proved air entry today, but remains with significant wheezing.   RRR,No Gallops,Rubs or new Murmurs, No Parasternal Heave +ve B.Sounds, Abd Soft, No tenderness, No rebound - guarding or rigidity. No Cyanosis, Clubbing or edema, No new Rash or bruise      Data Reviewed: I have personally reviewed following labs and imaging studies  CBC: Recent Labs  Lab 11/08/22 1244 11/09/22 0455  WBC 10.6* 8.3  NEUTROABS 8.2*  --   HGB 15.8 15.6  HCT 45.3 44.5  MCV 97.2 96.7  PLT 290 811    Basic Metabolic Panel: Recent Labs  Lab 11/08/22 1244 11/09/22 0455  NA 126* 128*  K 4.0 4.2  CL 91* 95*  CO2 24 24  GLUCOSE 97 130*  BUN 9 11  CREATININE 0.83 0.82  CALCIUM 8.6* 8.5*  MG 2.0  --  GFR: Estimated Creatinine Clearance: 87.2 mL/min (by C-G formula based on SCr of 0.82 mg/dL).  Liver Function Tests: No results for input(s): "AST", "ALT", "ALKPHOS", "BILITOT", "PROT", "ALBUMIN" in the last 168 hours.  CBG: No results for input(s): "GLUCAP" in the last 168 hours.   Recent Results (from the past 240 hour(s))  Resp Panel by RT-PCR (Flu A&B, Covid) Anterior Nasal Swab     Status: None   Collection Time: 11/08/22 12:18 PM   Specimen: Anterior Nasal Swab  Result Value Ref Range Status   SARS Coronavirus 2 by RT PCR NEGATIVE NEGATIVE Final     Comment: (NOTE) SARS-CoV-2 target nucleic acids are NOT DETECTED.  The SARS-CoV-2 RNA is generally detectable in upper respiratory specimens during the acute phase of infection. The lowest concentration of SARS-CoV-2 viral copies this assay can detect is 138 copies/mL. A negative result does not preclude SARS-Cov-2 infection and should not be used as the sole basis for treatment or other patient management decisions. A negative result may occur with  improper specimen collection/handling, submission of specimen other than nasopharyngeal swab, presence of viral mutation(s) within the areas targeted by this assay, and inadequate number of viral copies(<138 copies/mL). A negative result must be combined with clinical observations, patient history, and epidemiological information. The expected result is Negative.  Fact Sheet for Patients:  EntrepreneurPulse.com.au  Fact Sheet for Healthcare Providers:  IncredibleEmployment.be  This test is no t yet approved or cleared by the [place] FDA and  has been authorized for detection and/or diagnosis of SARS-CoV-2 by FDA under an Emergency Use Authorization (EUA). This EUA will remain  in effect (meaning this test can be used) for the duration of the COVID-19 declaration under Section 564(b)(1) of the Act, 21 U.S.C.section 360bbb-3(b)(1), unless the authorization is terminated  or revoked sooner.       Influenza A by PCR NEGATIVE NEGATIVE Final   Influenza B by PCR NEGATIVE NEGATIVE Final    Comment: (NOTE) The Xpert Xpress SARS-CoV-2/FLU/RSV plus assay is intended as an aid in the diagnosis of influenza from Nasopharyngeal swab specimens and should not be used as a sole basis for treatment. Nasal washings and aspirates are unacceptable for Xpert Xpress SARS-CoV-2/FLU/RSV testing.  Fact Sheet for Patients: EntrepreneurPulse.com.au  Fact Sheet for Healthcare  Providers: IncredibleEmployment.be  This test is not yet approved or cleared by the [place] FDA and has been authorized for detection and/or diagnosis of SARS-CoV-2 by FDA under an Emergency Use Authorization (EUA). This EUA will remain in effect (meaning this test can be used) for the duration of the COVID-19 declaration under Section 564(b)(1) of the Act, 21 U.S.C. section 360bbb-3(b)(1), unless the authorization is terminated or revoked.  Performed at [facility], [address], [place], [place] [address]          Radiology Studies: CT Angio Chest Pulmonary Embolism (PE) W or WO Contrast  Result Date: 11/08/2022 CLINICAL DATA:  Increased shortness of breath for several days, initial encounter EXAM: CT ANGIOGRAPHY CHEST WITH CONTRAST TECHNIQUE: Multidetector CT imaging of the chest was performed using the standard protocol during bolus administration of intravenous contrast. Multiplanar CT image reconstructions and MIPs were obtained to evaluate the vascular anatomy. RADIATION DOSE REDUCTION: This exam was performed according to the departmental dose-optimization program which includes automated exposure control, adjustment of the mA and/or kV according to patient size and/or use of iterative reconstruction technique. CONTRAST:  159mL OMNIPAQUE IOHEXOL 350 MG/ML SOLN COMPARISON:  Chest x-ray from earlier in the same  day, CT from 04/30/2022 FINDINGS: Cardiovascular: Atherosclerotic calcifications of the thoracic aorta are noted. No aneurysmal dilatation or dissection is noted. Mild coronary calcifications are seen. No cardiac enlargement is noted. Pulmonary artery is well visualized within normal branching pattern. No filling defect to suggest pulmonary embolism is noted. Mediastinum/Nodes: Thoracic inlet is within normal limits. No hilar or mediastinal adenopathy is noted. The esophagus as visualized is within normal limits. Lungs/Pleura: Lungs are well aerated  bilaterally. Diffuse emphysematous changes are seen. Chronic areas of scarring are again seen in the upper lobes slightly more prominent on the right than the left but stable in appearance. Stable 3 mm pulmonary nodule is noted in the lateral aspect of the left lower lobe best noted on image number 117 of series 6. Upper Abdomen: Visualized upper abdomen shows changes of prior cholecystectomy. No other focal abnormality is seen. Musculoskeletal: Degenerative changes of the thoracic spine are noted. No acute rib abnormality is seen. Review of the MIP images confirms the above findings. IMPRESSION: No evidence of pulmonary embolism. Chronic scarring bilaterally. Stable 3 mm left lower lobe pulmonary nodule. No follow-up needed. This recommendation follows the consensus statement: Guidelines for Management of Incidental Pulmonary Nodules Detected on CT Images: From the Fleischner Society 2017; Radiology 2017; 284:228-243. Aortic Atherosclerosis (ICD10-I70.0) and Emphysema (ICD10-J43.9). Electronically Signed   By: Inez Catalina M.D.   On: 11/08/2022 19:45   DG Chest Port 1 View  Result Date: 11/08/2022 CLINICAL DATA:  Shortness of breath EXAM: PORTABLE CHEST 1 VIEW COMPARISON:  Chest x-ray dated June 27, 2021 FINDINGS: The heart size and mediastinal contours are within normal limits. Background emphysema. No consolidation. Right upper lobe pleural-based density and left upper lobe nodular opacity seen on prior radiograph have resolved. The visualized skeletal structures are unremarkable. IMPRESSION: Background emphysema with no evidence of acute airspace opacity. Electronically Signed   By: Yetta Glassman M.D.   On: 11/08/2022 12:31        Scheduled Meds:  amLODipine  10 mg Oral Daily   aspirin EC  81 mg Oral Q2000   chlordiazePOXIDE  10 mg Oral TID   diltiazem  360 mg Oral Daily   enoxaparin (LOVENOX) injection  40 mg Subcutaneous Q24H   fluticasone furoate-vilanterol  1 puff Inhalation Daily   And    umeclidinium bromide  1 puff Inhalation Daily   folic acid  1 mg Oral Daily   guaiFENesin  1,200 mg Oral BID   ipratropium-albuterol  3 mL Nebulization Q6H   irbesartan  37.5 mg Oral Daily   melatonin  3 mg Oral QHS   methylPREDNISolone (SOLU-MEDROL) injection  80 mg Intravenous Q12H   Followed by   Derrill Memo ON 11/11/2022] predniSONE  40 mg Oral Q breakfast   multivitamin with minerals  1 tablet Oral Daily   pantoprazole  40 mg Oral QHS   thiamine  100 mg Oral Daily   Or   thiamine  100 mg Intravenous Daily   Continuous Infusions:  cefTRIAXone (ROCEPHIN)  IV       LOS: 1 day      Phillips Climes, MD [facility]   To contact the attending provider between 7A-7P or the covering provider during after hours 7P-7A, please log into the web site [email or web address] and access using universal [place] password for that web site. If you do not have the password, please call the hospital operator.  11/09/2022, 10:52 AM

## 2022-11-10 DIAGNOSIS — E871 Hypo-osmolality and hyponatremia: Secondary | ICD-10-CM | POA: Diagnosis not present

## 2022-11-10 DIAGNOSIS — J441 Chronic obstructive pulmonary disease with (acute) exacerbation: Secondary | ICD-10-CM | POA: Diagnosis not present

## 2022-11-10 DIAGNOSIS — I10 Essential (primary) hypertension: Secondary | ICD-10-CM | POA: Diagnosis not present

## 2022-11-10 DIAGNOSIS — J9601 Acute respiratory failure with hypoxia: Secondary | ICD-10-CM | POA: Diagnosis not present

## 2022-11-10 LAB — CBC
HCT: 42.3 % (ref 39.0–52.0)
Hemoglobin: 14.6 g/dL (ref 13.0–17.0)
MCH: 33.7 pg (ref 26.0–34.0)
MCHC: 34.5 g/dL (ref 30.0–36.0)
MCV: 97.7 fL (ref 80.0–100.0)
Platelets: 309 K/uL (ref 150–400)
RBC: 4.33 MIL/uL (ref 4.22–5.81)
RDW: 12 % (ref 11.5–15.5)
WBC: 13 K/uL — ABNORMAL HIGH (ref 4.0–10.5)
nRBC: 0 % (ref 0.0–0.2)

## 2022-11-10 LAB — BASIC METABOLIC PANEL
Anion gap: 7 (ref 5–15)
BUN: 13 mg/dL (ref 8–23)
CO2: 26 mmol/L (ref 22–32)
Calcium: 8.2 mg/dL — ABNORMAL LOW (ref 8.9–10.3)
Chloride: 96 mmol/L — ABNORMAL LOW (ref 98–111)
Creatinine, Ser: 0.68 mg/dL (ref 0.61–1.24)
GFR, Estimated: >60 mL/min (ref >60)
Glucose, Bld: 145 mg/dL — ABNORMAL HIGH (ref 70–99)
Potassium: 3.9 mmol/L (ref 3.5–5.1)
Sodium: 129 mmol/L — ABNORMAL LOW (ref 135–145)

## 2022-11-10 MED ORDER — AMLODIPINE BESYLATE 5 MG PO TABS
5.0000 mg | ORAL_TABLET | Freq: Every day | ORAL | Status: DC
  Administered 2022-11-11: 5 mg via ORAL
  Filled 2022-11-10: qty 1

## 2022-11-10 MED ORDER — ARFORMOTEROL TARTRATE 15 MCG/2ML IN NEBU
15.0000 ug | INHALATION_SOLUTION | Freq: Two times a day (BID) | RESPIRATORY_TRACT | Status: DC
  Administered 2022-11-10 – 2022-11-11 (×2): 15 ug via RESPIRATORY_TRACT
  Filled 2022-11-10 (×2): qty 2

## 2022-11-10 MED ORDER — BUDESONIDE 0.5 MG/2ML IN SUSP
0.5000 mg | Freq: Two times a day (BID) | RESPIRATORY_TRACT | Status: DC
  Administered 2022-11-10 – 2022-11-11 (×2): 0.5 mg via RESPIRATORY_TRACT
  Filled 2022-11-10 (×2): qty 2

## 2022-11-10 MED ORDER — IPRATROPIUM-ALBUTEROL 0.5-2.5 (3) MG/3ML IN SOLN
3.0000 mL | Freq: Four times a day (QID) | RESPIRATORY_TRACT | Status: DC
  Administered 2022-11-10 – 2022-11-11 (×6): 3 mL via RESPIRATORY_TRACT
  Filled 2022-11-10 (×6): qty 3

## 2022-11-10 NOTE — Progress Notes (Signed)
SATURATION QUALIFICATIONS: (This note is used to comply with regulatory documentation for home oxygen)  Patient Saturations on Room Air at Rest = 94%  Patient Saturations on Room Air while Ambulating = 86%  Patient Saturations on 2 Liters of oxygen while Ambulating = 93%  Please briefly explain why patient needs home oxygen: patient had to take a break in the hallway because patient was SOB and oxygen was 86%.

## 2022-11-10 NOTE — Progress Notes (Signed)
PROGRESS NOTE    Jeremy [name]  WIO:[id number] DOB: 10-31-1951 DOA: 11/08/2022 PCP: Redmond School, MD    Chief Complaint  Patient presents with   Shortness of Breath    Brief Narrative:   Kahleel Fadeley  is a 71 y.o. male, with past medical history of COPD, hypertension, alcohol abuse, he presents to ED secondary to dyspnea, and cough, his workup significant for COPD exacerbation and new oxygen requirement, he was admitted for further management.  .  Assessment & Plan:   Acute hypoxic respiratory failure COPD exacerbation -Patient presents with dyspnea, significant wheezing, cough with productive phlegm -85-86% on room air initially, on 2 L nasal cannula, he remains with oxygen requirement this morning, will wean as tolerated.   -CT demonstrating no pulmonary embolism. -No evidence of pneumonia on imaging. -Dyspnea and wheezing has improved, but remains significant, as well remains on oxygen requirement, will continue with current dose IV Solu-Medrol. -Continue treatment with scheduled DuoNeb, Pulmicort and Brovana; continue mucolytic's agents and the use of flutter valve. -Continue current antibiotic therapy, for concerns of underlying bronchiectasis and early community-acquired pneumonia.. -Continue to follow clinical response and check desaturation screening.   Hyponatremia -Due to volume depletion, and the use of chronic alcohol use. -Continue with NS -Continue to hold hydrochlorothiazide. -Patient advised to maintain adequate hydration.   Alcohol abuse -will keep on CIWA protocol -Continue treatment with Librium -No signs of withdrawal appreciated. -Cessation counseling and continue use of thiamine and folic acid provided.   History of tobacco use  -Patient reports he is smoking no more, but currently vaping.   -Cessation counseling provided.   GERD -continue with PPI- -lifestyle changes discussed with patient.   Hypertension -Continue with home  medication, except for HCTZ due to hyponatremia -Heart healthy diet discussed with patient.    DVT prophylaxis: (Lovenox) Code Status: (Full) Family Communication: None at bedside Disposition:   Status is: Inpatient    Consultants:  none  Subjective: No chest pain, no nausea or vomiting.  Currently afebrile.  Demonstrating difficulty speaking in full sentences and short winded sensation with activity.  Requiring 2 L nasal cannula supplementation.  Objective: Vitals:   11/10/22 0846 11/10/22 0920 11/10/22 1303 11/10/22 1431  BP: (!) 118/52  (!) 135/59   Pulse:   77   Resp:   (!) 24   Temp:   98.4 F (36.9 C)   TempSrc:   Oral   SpO2:  97% 97% 97%  Weight:      Height:        Intake/Output Summary (Last 24 hours) at 11/10/2022 1500 Last data filed at 11/10/2022 1300 Gross per 24 hour  Intake 759.12 ml  Output --  Net 759.12 ml   Filed Weights   11/08/22 1205 11/09/22 0822  Weight: 88.9 kg 86 kg    Examination: General exam: Alert, awake, oriented x 3; reporting intermittent coughing spells and having difficulty speaking in full sentences.  Still short winded and requiring 2 L nasal cannula supplementation. Respiratory system: Decreased air movement bilaterally; positive wheezing; no using accessory muscles. Cardiovascular system:RRR. No murmurs, rubs, gallops. Gastrointestinal system: Abdomen is nondistended, soft and nontender. No organomegaly or masses felt. Normal bowel sounds heard. Central nervous system: Alert and oriented. No focal neurological deficits. Extremities: No cyanosis, no clubbing. Skin: No petechiae. Psychiatry: Judgement and insight appear normal. Mood & affect appropriate.    Data Reviewed: I have personally reviewed following labs and imaging studies  CBC: Recent Labs  Lab 11/08/22 1244 11/09/22  0455 11/10/22 0441  WBC 10.6* 8.3 13.0*  NEUTROABS 8.2*  --   --   HGB 15.8 15.6 14.6  HCT 45.3 44.5 42.3  MCV 97.2 96.7 97.7  PLT 290  309 585    Basic Metabolic Panel: Recent Labs  Lab 11/08/22 1244 11/09/22 0455 11/10/22 0441  NA 126* 128* 129*  K 4.0 4.2 3.9  CL 91* 95* 96*  CO2 24 24 26   GLUCOSE 97 130* 145*  BUN 9 11 13   CREATININE 0.83 0.82 0.68  CALCIUM 8.6* 8.5* 8.2*  MG 2.0  --   --     GFR: Estimated Creatinine Clearance: 89.3 mL/min (by C-G formula based on SCr of 0.68 mg/dL).  CBG: Recent Labs  Lab 11/09/22 2114  GLUCAP 175*    Recent Results (from the past 240 hour(s))  Resp Panel by RT-PCR (Flu A&B, Covid) Anterior Nasal Swab     Status: None   Collection Time: 11/08/22 12:18 PM   Specimen: Anterior Nasal Swab  Result Value Ref Range Status   SARS Coronavirus 2 by RT PCR NEGATIVE NEGATIVE Final    Comment: (NOTE) SARS-CoV-2 target nucleic acids are NOT DETECTED.  The SARS-CoV-2 RNA is generally detectable in upper respiratory specimens during the acute phase of infection. The lowest concentration of SARS-CoV-2 viral copies this assay can detect is 138 copies/mL. A negative result does not preclude SARS-Cov-2 infection and should not be used as the sole basis for treatment or other patient management decisions. A negative result may occur with  improper specimen collection/handling, submission of specimen other than nasopharyngeal swab, presence of viral mutation(s) within the areas targeted by this assay, and inadequate number of viral copies(<138 copies/mL). A negative result must be combined with clinical observations, patient history, and epidemiological information. The expected result is Negative.  Fact Sheet for Patients:  EntrepreneurPulse.com.au  Fact Sheet for Healthcare Providers:  IncredibleEmployment.be  This test is no t yet approved or cleared by the [place] FDA and  has been authorized for detection and/or diagnosis of SARS-CoV-2 by FDA under an Emergency Use Authorization (EUA). This EUA will remain  in effect (meaning  this test can be used) for the duration of the COVID-19 declaration under Section 564(b)(1) of the Act, 21 U.S.C.section 360bbb-3(b)(1), unless the authorization is terminated  or revoked sooner.       Influenza A by PCR NEGATIVE NEGATIVE Final   Influenza B by PCR NEGATIVE NEGATIVE Final    Comment: (NOTE) The Xpert Xpress SARS-CoV-2/FLU/RSV plus assay is intended as an aid in the diagnosis of influenza from Nasopharyngeal swab specimens and should not be used as a sole basis for treatment. Nasal washings and aspirates are unacceptable for Xpert Xpress SARS-CoV-2/FLU/RSV testing.  Fact Sheet for Patients: EntrepreneurPulse.com.au  Fact Sheet for Healthcare Providers: IncredibleEmployment.be  This test is not yet approved or cleared by the [place] FDA and has been authorized for detection and/or diagnosis of SARS-CoV-2 by FDA under an Emergency Use Authorization (EUA). This EUA will remain in effect (meaning this test can be used) for the duration of the COVID-19 declaration under Section 564(b)(1) of the Act, 21 U.S.C. section 360bbb-3(b)(1), unless the authorization is terminated or revoked.  Performed at [facility], [address] [place], [place] [address]      Radiology Studies: CT Angio Chest Pulmonary Embolism (PE) W or WO Contrast  Result Date: 11/08/2022 CLINICAL DATA:  Increased shortness of breath for several days, initial encounter EXAM: CT ANGIOGRAPHY CHEST WITH CONTRAST TECHNIQUE:  Multidetector CT imaging of the chest was performed using the standard protocol during bolus administration of intravenous contrast. Multiplanar CT image reconstructions and MIPs were obtained to evaluate the vascular anatomy. RADIATION DOSE REDUCTION: This exam was performed according to the departmental dose-optimization program which includes automated exposure control, adjustment of the mA and/or kV according to patient size and/or use of  iterative reconstruction technique. CONTRAST:  171mL OMNIPAQUE IOHEXOL 350 MG/ML SOLN COMPARISON:  Chest x-ray from earlier in the same day, CT from 04/30/2022 FINDINGS: Cardiovascular: Atherosclerotic calcifications of the thoracic aorta are noted. No aneurysmal dilatation or dissection is noted. Mild coronary calcifications are seen. No cardiac enlargement is noted. Pulmonary artery is well visualized within normal branching pattern. No filling defect to suggest pulmonary embolism is noted. Mediastinum/Nodes: Thoracic inlet is within normal limits. No hilar or mediastinal adenopathy is noted. The esophagus as visualized is within normal limits. Lungs/Pleura: Lungs are well aerated bilaterally. Diffuse emphysematous changes are seen. Chronic areas of scarring are again seen in the upper lobes slightly more prominent on the right than the left but stable in appearance. Stable 3 mm pulmonary nodule is noted in the lateral aspect of the left lower lobe best noted on image number 117 of series 6. Upper Abdomen: Visualized upper abdomen shows changes of prior cholecystectomy. No other focal abnormality is seen. Musculoskeletal: Degenerative changes of the thoracic spine are noted. No acute rib abnormality is seen. Review of the MIP images confirms the above findings. IMPRESSION: No evidence of pulmonary embolism. Chronic scarring bilaterally. Stable 3 mm left lower lobe pulmonary nodule. No follow-up needed. This recommendation follows the consensus statement: Guidelines for Management of Incidental Pulmonary Nodules Detected on CT Images: From the Fleischner Society 2017; Radiology 2017; 284:228-243. Aortic Atherosclerosis (ICD10-I70.0) and Emphysema (ICD10-J43.9). Electronically Signed   By: Inez Catalina M.D.   On: 11/08/2022 19:45    Scheduled Meds:  [START ON 11/11/2022] amLODipine  5 mg Oral Daily   arformoterol  15 mcg Nebulization BID   aspirin EC  81 mg Oral Q2000   budesonide (PULMICORT) nebulizer  solution  0.5 mg Nebulization BID   chlordiazePOXIDE  10 mg Oral TID   diltiazem  360 mg Oral Daily   enoxaparin (LOVENOX) injection  40 mg Subcutaneous K44W   folic acid  1 mg Oral Daily   guaiFENesin  1,200 mg Oral BID   ipratropium-albuterol  3 mL Nebulization Q6H WA   irbesartan  37.5 mg Oral Daily   melatonin  3 mg Oral QHS   methylPREDNISolone (SOLU-MEDROL) injection  80 mg Intravenous Q12H   Followed by   Derrill Memo ON 11/11/2022] predniSONE  40 mg Oral Q breakfast   multivitamin with minerals  1 tablet Oral Daily   pantoprazole  40 mg Oral QHS   thiamine  100 mg Oral Daily   Or   thiamine  100 mg Intravenous Daily   Continuous Infusions:  sodium chloride 75 mL/hr at 11/10/22 0606   cefTRIAXone (ROCEPHIN)  IV 2 g (11/10/22 1344)     LOS: 2 days    Barton Dubois, MD [facility]  Time: 35 minutes.   To contact the attending provider between 7A-7P or the covering provider during after hours 7P-7A, please log into the web site [email or web address] and access using universal [place] password for that web site. If you do not have the password, please call the hospital operator.  11/10/2022, 3:00 PM

## 2022-11-10 NOTE — Progress Notes (Signed)
Patient is resting in his bed at this time. Patient given 1 prn medication during shift. Most recent vitals signs are T. 98.5 P 72 RR 20 B/P 115/71 O2sat 98% on 2L/min.

## 2022-11-11 MED ORDER — PREDNISONE 20 MG PO TABS: ORAL_TABLET | ORAL | 0 refills | Status: DC

## 2022-11-11 MED ORDER — VITAMIN B-1 100 MG PO TABS: 100.0000 mg | ORAL_TABLET | Freq: Every day | ORAL | 1 refills | Status: DC

## 2022-11-11 MED ORDER — DM-GUAIFENESIN ER 30-600 MG PO TB12: 1.0000 | ORAL_TABLET | Freq: Two times a day (BID) | ORAL | 0 refills | Status: DC

## 2022-11-11 MED ORDER — FOLIC ACID 1 MG PO TABS: 1.0000 mg | ORAL_TABLET | Freq: Every day | ORAL | 2 refills | Status: DC

## 2022-11-11 MED ORDER — CEFDINIR 300 MG PO CAPS: 300.0000 mg | ORAL_CAPSULE | Freq: Two times a day (BID) | ORAL | 0 refills | Status: AC

## 2022-11-11 NOTE — Discharge Summary (Signed)
Physician Discharge Summary   Patient: Jeremy [name] MRN: [id number] DOB: 08-03-1951  Admit date:     11/08/2022  Discharge date: 11/11/22  Discharge Physician: Barton Dubois   PCP: Redmond School, MD   Recommendations at discharge:  Pressure and adjust antihypertensive regimen -Repeat chest x-ray in 6 weeks to assure complete resolution of infiltrate -Continue assisting patient with stopping vaping Outpatient follow-up with pulmonologist recommended Repeat basic metabolic panel to follow electrolytes and renal function. Reassess outpatient oxygen saturation and the need for future supplementation. Continue assisting patient with alcohol cessation.  Discharge Diagnoses: Principal Problem:   COPD exacerbation ([place]) Active Problems:   Essential hypertension   Acute hypoxemic respiratory failure (HCC)   Hyponatremia GERD Alcohol abuse.   Hospital Course: As per H&P written by Dr. Waldron Labs on 11/08/2022 Jeremy [name]  is a 71 y.o. male, with past medical history of COPD, hypertension, alcohol abuse, he presents to ED secondary to dyspnea, reports over the last 5 to 7 days he started to have some dyspnea, cough, sore throat, was prescribed azithromycin, guaifenesin/codeine, without much improvement of his symptoms, reports he was taking Tylenol, unsure if he had any fever, reports poor appetite, significant wheezing, cough, productive sputum, blood-streaked sometimes, reported drinks 4-5 shots of whiskey every afternoon, reports he recently stopped smoking but started to vape instead. -In ED patient was hypoxic 85% on room air, CTA chest with no evidence of PE, or pneumonia, he had significant wheezing improved with steroids, but remains hypoxic on 2 L nasal cannula, [facility] hospitalist consulted to admit.  Assessment and Plan: Acute hypoxic respiratory failure COPD exacerbation -Patient presents with dyspnea, significant wheezing, cough with productive phlegm -85-86% on room air  initially, on 2 L nasal cannula, he remains with oxygen requirement at time of discharge, especially with activity. -Will continue treatment with antibiotics, steroids tapering, mucolytic's/antitussive regimen, the use of flutter valve and bronchodilator management. -Outpatient follow-up with pulmonology service recommended. -CT demonstrating no pulmonary embolism. -Stable and improved at time of discharge; patient has been advised to stop vaping.  Hyponatremia -Due to volume depletion, and the use of chronic alcohol use. -Patient vies to maintain adequate hydration -Repeat basic metabolic panel follow-up visit to assess electrolytes stability. -Continue just as needed diuretic for edema and swelling; daily HCTZ has been discontinued.   Alcohol abuse -CIWA protocol use while hospitalized; no withdrawal symptoms appreciated -Cessation counseling provided -Continue folic acid and thiamine.  History of tobacco use  -Patient reports he is smoking no more, but currently vaping.   -Cessation counseling provided.   GERD -continue with PPI- -lifestyle changes discussed with patient.   Hypertension -Stable and improved at discharge -Resume home antihypertensive regimen -Patient advised to follow heart healthy diet.    Consultants:  Procedures performed: See below for x-ray report Disposition: Home Diet recommendation: Heart healthy diet  DISCHARGE MEDICATION: Allergies as of 11/11/2022       Reactions   Penicillins Other (See Comments)   Has patient had a PCN reaction causing immediate rash, facial/tongue/throat swelling, SOB or lightheadedness with hypotension:##Yes# but pt states he is ok with amoxicillin. Has patient had a PCN reaction causing severe rash involving mucus membranes or skin necrosis: No Has patient had a PCN reaction that required hospitalization: No Has patient had a PCN reaction occurring within the last 10 years: No If all of the above answers are "NO", then  may proceed with Cephalosporin use.   Tape Other (See Comments)   Skin tears and bruises-use paper tape  Medication List     STOP taking these medications    azithromycin 250 MG tablet Commonly known as: ZITHROMAX   fluocinolone 0.01 % cream Commonly known as: VANOS   guaiFENesin-codeine 100-10 MG/5ML syrup   hydrochlorothiazide 25 MG tablet Commonly known as: HYDRODIURIL       TAKE these medications    acetaminophen 500 MG tablet Commonly known as: TYLENOL Take 2 tablets (1,000 mg total) by mouth every 6 (six) hours as needed for moderate pain or mild pain.   ALPRAZolam 0.5 MG tablet Commonly known as: XANAX Take 0.25-0.5 mg by mouth 3 (three) times daily as needed for anxiety.   amLODipine 10 MG tablet Commonly known as: NORVASC Take 10 mg by mouth daily.   aspirin EC 81 MG tablet Take 1 tablet (81 mg total) by mouth 2 (two) times daily. For DVT prophylaxis for 30 days after surgery. What changed:  when to take this additional instructions   cefdinir 300 MG capsule Commonly known as: OMNICEF Take 1 capsule (300 mg total) by mouth 2 (two) times daily for 4 days.   cyanocobalamin 1000 MCG/ML injection Commonly known as: VITAMIN B12 Inject 1,000 mcg into the muscle every 30 (thirty) days.   dextromethorphan-guaiFENesin 30-600 MG 12hr tablet Commonly known as: MUCINEX DM Take 1 tablet by mouth 2 (two) times daily.   diltiazem 360 MG 24 hr capsule Commonly known as: CARDIZEM CD Take 360 mg by mouth daily.   folic acid 1 MG tablet Commonly known as: FOLVITE Take 1 tablet (1 mg total) by mouth daily. Start taking on: November 12, 2022   furosemide 20 MG tablet Commonly known as: LASIX Take 20 mg by mouth daily as needed.   GLUCOSAMINE PO Take 2 tablets by mouth daily.   loperamide 2 MG capsule Commonly known as: IMODIUM Take 1 capsule (2 mg total) by mouth 4 (four) times daily as needed for diarrhea or loose stools.   meloxicam 7.5 MG  tablet Commonly known as: MOBIC Take 7.5 mg by mouth daily.   olmesartan 40 MG tablet Commonly known as: BENICAR TAKE ONE TABLET BY MOUTH ONCE DAILY.   pantoprazole 40 MG tablet Commonly known as: PROTONIX Take 40 mg by mouth daily.   potassium chloride SA 20 MEQ tablet Commonly known as: KLOR-CON M Take 20 mEq by mouth as needed.   predniSONE 20 MG tablet Commonly known as: DELTASONE Take 3 tablets by mouth daily x 1 day; then 2 tablets by mouth daily x 2 days; then 1 tablet by mouth daily x 3 days; then half tablet by mouth daily x 3 days and stop prednisone. Start taking on: November 12, 2022   PROAIR HFA IN Inhale 2 puffs into the lungs daily as needed (shortness of breath).   thiamine 100 MG tablet Commonly known as: Vitamin B-1 Take 1 tablet (100 mg total) by mouth daily. Start taking on: November 12, 2022   Trelegy Ellipta 100-62.5-25 MCG/ACT Aepb Generic drug: Fluticasone-Umeclidin-Vilant INHALE 1 PUFF INTO THE LUNGS FIRST THING EACH MORNING.   TURMERIC PO Take 1 capsule by mouth daily.   Vitamin D 50 MCG (2000 UT) Caps Take 4,000 Units by mouth daily.   vitamin E 180 MG (400 UNITS) capsule Take 400 Units by mouth daily.               Durable Medical Equipment  (From admission, onward)           Start     Ordered   11/11/22 [phone]  For home use only DME oxygen  Once       Question Answer Comment  Length of Need 12 Months   Mode or (Route) Nasal cannula   Liters per Minute 2   Frequency Continuous (stationary and portable oxygen unit needed)   Oxygen conserving device Yes   Oxygen delivery system Gas      11/11/22 0836            Follow-up Information     [facility] Follow up.   Specialty: Home Health Services Why: Will contact you to schedule home health visits. Contact information: [address] [place] [address] [phone]         Redmond School, MD. Schedule an appointment as soon as possible  for a visit in 10 day(s).   Specialty: Internal Medicine Contact information: [address] [place]  [address] [phone]                Discharge Exam: Danley Danker Weights   11/08/22 1205 11/09/22 0822  Weight: 88.9 kg 86 kg   General exam: Alert, awake, oriented x 3; able to speak in full sentences and feeling better.  Requiring 2 L nasal cannula supplementation at discharge mainly with activity. Respiratory system: Positive scattered rhonchi; mild expiratory wheezing, no using accessory muscles. Cardiovascular system:RRR. No rubs or gallops. Gastrointestinal system: Abdomen is nondistended, soft and nontender. No organomegaly or masses felt. Normal bowel sounds heard. Central nervous system: Alert and oriented. No focal neurological deficits. Extremities: No cyanosis or clubbing. Skin: No petechiae. Psychiatry: Judgement and insight appear normal. Mood & affect appropriate.    Condition at discharge: Stable and improved.  The results of significant diagnostics from this hospitalization (including imaging, microbiology, ancillary and laboratory) are listed below for reference.   Imaging Studies: CT Angio Chest Pulmonary Embolism (PE) W or WO Contrast  Result Date: 11/08/2022 CLINICAL DATA:  Increased shortness of breath for several days, initial encounter EXAM: CT ANGIOGRAPHY CHEST WITH CONTRAST TECHNIQUE: Multidetector CT imaging of the chest was performed using the standard protocol during bolus administration of intravenous contrast. Multiplanar CT image reconstructions and MIPs were obtained to evaluate the vascular anatomy. RADIATION DOSE REDUCTION: This exam was performed according to the departmental dose-optimization program which includes automated exposure control, adjustment of the mA and/or kV according to patient size and/or use of iterative reconstruction technique. CONTRAST:  143mL OMNIPAQUE IOHEXOL 350 MG/ML SOLN COMPARISON:  Chest x-ray from earlier in  the same day, CT from 04/30/2022 FINDINGS: Cardiovascular: Atherosclerotic calcifications of the thoracic aorta are noted. No aneurysmal dilatation or dissection is noted. Mild coronary calcifications are seen. No cardiac enlargement is noted. Pulmonary artery is well visualized within normal branching pattern. No filling defect to suggest pulmonary embolism is noted. Mediastinum/Nodes: Thoracic inlet is within normal limits. No hilar or mediastinal adenopathy is noted. The esophagus as visualized is within normal limits. Lungs/Pleura: Lungs are well aerated bilaterally. Diffuse emphysematous changes are seen. Chronic areas of scarring are again seen in the upper lobes slightly more prominent on the right than the left but stable in appearance. Stable 3 mm pulmonary nodule is noted in the lateral aspect of the left lower lobe best noted on image number 117 of series 6. Upper Abdomen: Visualized upper abdomen shows changes of prior cholecystectomy. No other focal abnormality is seen. Musculoskeletal: Degenerative changes of the thoracic spine are noted. No acute rib abnormality is seen. Review of the MIP images confirms the above findings. IMPRESSION: No evidence  of pulmonary embolism. Chronic scarring bilaterally. Stable 3 mm left lower lobe pulmonary nodule. No follow-up needed. This recommendation follows the consensus statement: Guidelines for Management of Incidental Pulmonary Nodules Detected on CT Images: From the Fleischner Society 2017; Radiology 2017; 284:228-243. Aortic Atherosclerosis (ICD10-I70.0) and Emphysema (ICD10-J43.9). Electronically Signed   By: Inez Catalina M.D.   On: 11/08/2022 19:45   DG Chest Port 1 View  Result Date: 11/08/2022 CLINICAL DATA:  Shortness of breath EXAM: PORTABLE CHEST 1 VIEW COMPARISON:  Chest x-ray dated June 27, 2021 FINDINGS: The heart size and mediastinal contours are within normal limits. Background emphysema. No consolidation. Right upper lobe pleural-based density  and left upper lobe nodular opacity seen on prior radiograph have resolved. The visualized skeletal structures are unremarkable. IMPRESSION: Background emphysema with no evidence of acute airspace opacity. Electronically Signed   By: Yetta Glassman M.D.   On: 11/08/2022 12:31    Microbiology: Results for orders placed or performed during the hospital encounter of 11/08/22  Resp Panel by RT-PCR (Flu A&B, Covid) Anterior Nasal Swab     Status: None   Collection Time: 11/08/22 12:18 PM   Specimen: Anterior Nasal Swab  Result Value Ref Range Status   SARS Coronavirus 2 by RT PCR NEGATIVE NEGATIVE Final    Comment: (NOTE) SARS-CoV-2 target nucleic acids are NOT DETECTED.  The SARS-CoV-2 RNA is generally detectable in upper respiratory specimens during the acute phase of infection. The lowest concentration of SARS-CoV-2 viral copies this assay can detect is 138 copies/mL. A negative result does not preclude SARS-Cov-2 infection and should not be used as the sole basis for treatment or other patient management decisions. A negative result may occur with  improper specimen collection/handling, submission of specimen other than nasopharyngeal swab, presence of viral mutation(s) within the areas targeted by this assay, and inadequate number of viral copies(<138 copies/mL). A negative result must be combined with clinical observations, patient history, and epidemiological information. The expected result is Negative.  Fact Sheet for Patients:  EntrepreneurPulse.com.au  Fact Sheet for Healthcare Providers:  IncredibleEmployment.be  This test is no t yet approved or cleared by the [place] FDA and  has been authorized for detection and/or diagnosis of SARS-CoV-2 by FDA under an Emergency Use Authorization (EUA). This EUA will remain  in effect (meaning this test can be used) for the duration of the COVID-19 declaration under Section 564(b)(1) of the  Act, 21 U.S.C.section 360bbb-3(b)(1), unless the authorization is terminated  or revoked sooner.       Influenza A by PCR NEGATIVE NEGATIVE Final   Influenza B by PCR NEGATIVE NEGATIVE Final    Comment: (NOTE) The Xpert Xpress SARS-CoV-2/FLU/RSV plus assay is intended as an aid in the diagnosis of influenza from Nasopharyngeal swab specimens and should not be used as a sole basis for treatment. Nasal washings and aspirates are unacceptable for Xpert Xpress SARS-CoV-2/FLU/RSV testing.  Fact Sheet for Patients: EntrepreneurPulse.com.au  Fact Sheet for Healthcare Providers: IncredibleEmployment.be  This test is not yet approved or cleared by the [place] FDA and has been authorized for detection and/or diagnosis of SARS-CoV-2 by FDA under an Emergency Use Authorization (EUA). This EUA will remain in effect (meaning this test can be used) for the duration of the COVID-19 declaration under Section 564(b)(1) of the Act, 21 U.S.C. section 360bbb-3(b)(1), unless the authorization is terminated or revoked.  Performed at [facility], [address], [place], [place] [address]     Labs: CBC: Recent Labs  Lab 11/08/22  1244 11/09/22 0455 11/10/22 0441  WBC 10.6* 8.3 13.0*  NEUTROABS 8.2*  --   --   HGB 15.8 15.6 14.6  HCT 45.3 44.5 42.3  MCV 97.2 96.7 97.7  PLT 290 309 277   Basic Metabolic Panel: Recent Labs  Lab 11/08/22 1244 11/09/22 0455 11/10/22 0441  NA 126* 128* 129*  K 4.0 4.2 3.9  CL 91* 95* 96*  CO2 24 24 26   GLUCOSE 97 130* 145*  BUN 9 11 13   CREATININE 0.83 0.82 0.68  CALCIUM 8.6* 8.5* 8.2*  MG 2.0  --   --    Liver Function Tests: No results for input(s): "AST", "ALT", "ALKPHOS", "BILITOT", "PROT", "ALBUMIN" in the last 168 hours. CBG: Recent Labs  Lab 11/09/22 2114  GLUCAP 175*    Discharge time spent: greater than 30 minutes.  Signed: Barton Dubois, MD [facility] 11/11/2022

## 2022-11-11 NOTE — TOC Transition Note (Signed)
Transition of Care [facility]) - CM/SW Discharge Note   Patient Details  Name: Jeremy [name] MRN: [id number] Date of Birth: [birth year]  Transition of Care [facility]) CM/SW Contact:  Salome Arnt, LCSW Phone Number: 11/11/2022, 2:20 PM   Clinical Narrative: Pt d/c today. O2 delivered. [name] with [facility] aware of d/c. Orders in.       Final next level of care: [place] Barriers to Discharge: Barriers Resolved   Patient Goals and CMS Choice Patient states their goals for this hospitalization and ongoing recovery are:: return home   Choice offered to / list presented to : Patient  Discharge Placement                  Name of family member notified: wife Patient and family notified of of transfer: 11/11/22  Discharge Plan and Services In-house Referral: Clinical Social Work   Post Acute Care Choice: Durable Medical Equipment          DME Arranged: Oxygen DME Agency: [facility] Date DME Agency Contacted: 11/11/22 Time DME Agency Contacted: 1100 Representative spoke with at DME Agency: Erasmo Downer HH Arranged: RN [place] Agency: [place] Date [place]: 11/11/22 Time [place] Agency Contacted: 1100 Representative spoke with at [place]: [name]  Social Determinants of Health ([place]) Interventions     Readmission Risk Interventions     No data to display

## 2022-11-11 NOTE — TOC Initial Note (Addendum)
Transition of Care [facility]) - Initial/Assessment Note    Patient Details  Name: Jeremy [name] MRN: [id number] Date of Birth: Nov 06, 1951  Transition of Care [facility]) CM/SW Contact:    Salome Arnt, [place] Phone Number: 11/11/2022, 8:34 AM  Clinical Narrative:  Pt admitted due to COPD exacerbation. Assessment completed with pt. Pt reports he lives with his wife and is fairly independent with ADLs. He drives himself to appointments. TOC received consult for substance absue. Pt indicates he drinks 5 shots of whiskey a day. He states he will not be drinking anymore when he returns home and said he plans to stop on his own which he has done before. He declines resources.  Pt will need home O2. Discussed with pt who requests [facility]. Will send referral after orders placed.                PT recommending home health. Pt has no preference on agency. Referred and accepted by [name] with [facility].   Update: [facility] has received order and said referral is in process.   Update #2: Pt is out of network with [facility]. Pt notified and agreeable to send to [facility]. Kristin with [facility] notified.   Expected Discharge Plan: Home/Self Care Barriers to Discharge: Continued Medical Work up   Patient Goals and CMS Choice Patient states their goals for this hospitalization and ongoing recovery are:: return home   Choice offered to / list presented to : Patient  Expected Discharge Plan and Services Expected Discharge Plan: Home/Self Care In-house Referral: Clinical Social Work   Post Acute Care Choice: Durable Medical Equipment Living arrangements for the past 2 months: Single Family Home                 DME Arranged: Oxygen DME Agency: [facility] Date DME Agency Contacted: 11/11/22 Time DME Agency Contacted: [phone]              Prior Living Arrangements/Services Living arrangements for the past 2 months: [place] Lives with::  Spouse Patient language and need for interpreter reviewed:: Yes Do you feel safe going back to the place where you live?: Yes          Current home services: DME (walker, cane) Criminal Activity/Legal Involvement Pertinent to Current Situation/Hospitalization: No - Comment as needed  Activities of Daily Living Home Assistive Devices/Equipment: Walker (specify type) ADL Screening (condition at time of admission) Patient's cognitive ability adequate to safely complete daily activities?: No Is the patient deaf or have difficulty hearing?: Yes Does the patient have difficulty seeing, even when wearing glasses/contacts?: Yes Does the patient have difficulty concentrating, remembering, or making decisions?: No Patient able to express need for assistance with ADLs?: Yes Does the patient have difficulty dressing or bathing?: No Independently performs ADLs?: Yes (appropriate for developmental age) Does the patient have difficulty walking or climbing stairs?: No Weakness of Legs: Both Weakness of Arms/Hands: None  Permission Sought/Granted                  Emotional Assessment     Affect (typically observed): Appropriate Orientation: : Oriented to Self, Oriented to Place, Oriented to  Time, Oriented to Situation Alcohol / Substance Use: Alcohol Use Psych Involvement: No (comment)  Admission diagnosis:  Hyponatremia [E87.1] COPD exacerbation ([place]) [J44.1] Patient Active Problem List   Diagnosis Date Noted   COPD exacerbation ([place]) 11/08/2022   Hyponatremia 11/08/2022   Drug-induced skin rash 07/19/2022   S/P total right hip arthroplasty  02/26/2022   Rhinitis, chronic 11/03/2021   COPD GOLD 3 08/20/2021   Lung nodule 07/02/2021   Acute hypoxemic respiratory failure ([place]) 06/14/2021   Community acquired pneumonia 06/14/2021   Primary osteoarthritis of hip 05/05/2018   Alcohol use 03/30/2018   Primary osteoarthritis of left hip 03/30/2018   Essential hypertension 03/30/2018    History of lumbar surgery 03/30/2018   History of cervical spinal surgery 03/30/2018   Tobacco abuse disorder 03/30/2018   PCP:  Redmond School, MD Pharmacy:   [facility], [address] [place] [address] Phone: [phone] Fax: [phone]     Social Determinants of Health (SDOH) Interventions    Readmission Risk Interventions     No data to display

## 2022-11-11 NOTE — Evaluation (Signed)
Physical Therapy Evaluation Patient Details Name: Jeremy Jeremy [name] MRN: [id number] DOB: 09/24/1951 Today's Date: 11/11/2022  History of Present Illness  Jeremy Jeremy [name]  is a 71 y.o. male, with past medical history of COPD, hypertension, alcohol abuse, he presents to ED secondary to dyspnea, reports over the last 5 to 7 days he started to have some dyspnea, cough, sore throat, was prescribed azithromycin, guaifenesin/codeine, without much improvement of his symptoms, reports he was taking Tylenol, unsure if he had any fever, reports poor appetite, significant wheezing, cough, productive sputum, blood-streaked sometimes, reported drinks 4-5 shots of whiskey every afternoon, reports he recently stopped smoking but started to vape instead.   Clinical Impression  Patient functioning near baseline for functional mobility and gait other than requiring use of 1 LPM O2 during ambulation to keep SpO2 at 89-91% and limited mostly due to c/o mild SOB.  Patient tolerated staying up in chair after therapy with spouse present.  Plan:  Patient discharged from physical therapy to care of nursing for ambulation daily as tolerated for length of stay.      Recommendations for follow up therapy are one component of a multi-disciplinary discharge planning process, led by the attending physician.  Recommendations may be updated based on patient status, additional functional criteria and insurance authorization.  Follow Up Recommendations Home health PT      Assistance Recommended at Discharge Set up Supervision/Assistance  Patient can return home with the following  A little help with walking and/or transfers;A little help with bathing/dressing/bathroom;Help with stairs or ramp for entrance;Assistance with cooking/housework    Equipment Recommendations  None  Recommendations for Other Services       Functional Status Assessment Patient has had a recent decline in their functional status and demonstrates the  ability to make significant improvements in function in a reasonable and predictable amount of time.     Precautions / Restrictions Precautions Precautions: Fall Restrictions Weight Bearing Restrictions: Jeremy [name]      Mobility  Bed Mobility Overal bed mobility: Modified Independent                  Transfers Overall transfer level: Modified independent                      Ambulation/Gait Ambulation/Gait assistance: Modified independent (Device/Increase time) Gait Distance (Feet): 100 Feet Assistive device: Rolling walker (2 wheels) Gait Pattern/deviations: Decreased step length - right, Decreased step length - left, Decreased stride length Gait velocity: decreased     General Gait Details: good return for ambulating in room and hallway without loss of balance with slightly labored cadence on 1 LPM O2 with SpO2 at 89-91%  Stairs            Wheelchair Mobility    Modified Rankin (Stroke Patients Only)       Balance Overall balance assessment: Needs assistance Sitting-balance support: Feet supported, Jeremy [name] upper extremity supported Sitting balance-Jeremy [name] Scale: Good Sitting balance - Comments: seated at EOB   Standing balance support: During functional activity, Jeremy [name] upper extremity supported Standing balance-Jeremy [name] Scale: Poor Standing balance comment: fair/poor without AD, fair/good using RW                             Pertinent Vitals/Pain Pain Assessment Pain Assessment: Jeremy [name]/denies pain    Home Living Family/patient expects to be discharged to:: Private residence Living Arrangements: Spouse/significant other Available Help at Discharge: Family Type of Home: [place]  Access: Stairs to enter Entrance Stairs-Rails: Left Entrance Stairs-Number of Steps: 2   Home Layout: One level Home Equipment: Conservation officer, nature (2 wheels);Standard Walker;Cane - single point;Cane - quad;BSC/3in1;Toilet riser      Prior Function Prior Level of Function  : Independent/Modified Independent             Mobility Comments: pt using QC for mobility due to hip giving out. ADLs Comments: pt's wife assists as needed with bathing for safety and dressing     Hand Dominance   Dominant Hand: Right    Extremity/Trunk Assessment   Upper Extremity Assessment Upper Extremity Assessment: Overall WFL for tasks assessed    Lower Extremity Assessment Lower Extremity Assessment: Generalized weakness    Cervical / Trunk Assessment Cervical / Trunk Assessment: Normal  Communication   Communication: HOH  Cognition Arousal/Alertness: Awake/alert Behavior During Therapy: WFL for tasks assessed/performed Overall Cognitive Status: Within Functional Limits for tasks assessed                                          General Comments      Exercises     Assessment/Plan    PT Assessment All further PT needs can be met in the next venue of care  PT Problem List Decreased strength;Decreased activity tolerance;Decreased balance;Decreased mobility       PT Treatment Interventions      PT Goals (Current goals can be found in the Care Plan section)  Acute Rehab PT Goals Patient Stated Goal: return home with family to assist PT Goal Formulation: With patient/family Time For Goal Achievement: 11/11/22 Potential to Achieve Goals: Good    Frequency       Co-evaluation               AM-PAC PT "6 Clicks" Mobility  Outcome Measure Help needed turning from your back to your side while in a flat bed without using bedrails?: None Help needed moving from lying on your back to sitting on the side of a flat bed without using bedrails?: None Help needed moving to and from a bed to a chair (including a wheelchair)?: None Help needed standing up from a chair using your arms (e.g., wheelchair or bedside chair)?: None Help needed to walk in hospital room?: A Little Help needed climbing 3-5 steps with a railing? : A Little 6 Click  Score: 22    End of Session   Activity Tolerance: Patient tolerated treatment well;Patient limited by fatigue Patient left: in bed;with call bell/phone within reach;with family/visitor present Nurse Communication: Mobility status PT Visit Diagnosis: Unsteadiness on feet (R26.81);Other abnormalities of gait and mobility (R26.89)    Time: 1020-1040 PT Time Calculation (min) (ACUTE ONLY): 20 min   Charges:   PT Evaluation $PT Eval Moderate Complexity: 1 Mod PT Treatments $Therapeutic Activity: 8-22 mins        12:13 PM, 11/11/22 Lonell Grandchild, MPT Physical Therapist with [facility] [phone] office [phone] mobile phone

## 2022-12-04 ENCOUNTER — Other Ambulatory Visit: Payer: Self-pay | Admitting: Internal Medicine

## 2022-12-09 ENCOUNTER — Other Ambulatory Visit: Payer: Self-pay

## 2022-12-09 MED ORDER — OLMESARTAN MEDOXOMIL 40 MG PO TABS: 40.0000 mg | ORAL_TABLET | Freq: Every day | ORAL | 0 refills | Status: DC

## 2022-12-09 NOTE — Telephone Encounter (Signed)
fine

## 2022-12-09 NOTE — Telephone Encounter (Signed)
Patient called office asking for a refill to be sent to Pharmacy for Olmesartan prescribed by Dr. Melvyn Novas. States he will not need until Feb but wants to go ahead and have the refill on file so he doesn't have to contact office. Dr. Melvyn Novas is this ok with you? Patient wants [place] to refill and not PCP

## 2023-01-26 NOTE — Progress Notes (Signed)
Jeremy [name], male    DOB: 24-Sep-1951  MRN: [id number]   Brief patient profile:  47 yowm quit smoking cigarettes 06/2021 and stopped pipe 11/2021 Jeremy [name] vaping   referred to pulmonary clinic in [facility]  08/20/2021 by Dr  Hassell Halim)   s/p admit for CAP with residual dz ? All post inflammatory acute lung injury   Admit date: 06/14/2021  Discharge date: 06/16/21     Brief/Interim Summary: Hx significant of COPD, essential hypertension, tobacco use disorder, alcohol use disorder, GERD, history of lumbar surgery, history of cervical spinal surgery, primary osteoarthritis of hip s/p left total hip arthroplasty presents with shortness of breath with associated fever, productive sputum of 2 day duration found to have interstitial pneumonia on CXR. Given patient's older age, history of COPD, hypoxia, fever, and hyponatremia, PSI/PORT score (107) indicated moderate mortality risk patient was hospitalized for inpatient treatment of pneumonia. Patient was de satting at 87% on room air and improved to 94% on 2L [place]. Patient received prednisone '40mg'$ , antibiotics Rocephin and azithromycin IV. Blood culture was obtained showing...    Overnight, patient improved significantly with absence of fever for >15 hours. Patient tried exertion with and without oxygen to determine oxygen demand... Tobacco cessation counseling was provided at bedside.      Discharge Diagnoses:    Acute hypoxemic respiratory failure ([place])   Community acquired pneumonia   Acute on chronic respiratory failure secondary to COPD exacerbation and community acquired pneumonia    History of Present Illness  08/20/2021  Pulmonary/ 1st office eval/ [name] / [place] on ACEi  Chief Complaint  Patient presents with   Consult    Pneumonia in the hospital 2X. Er doctor noted some places on the lung after Chest CT. PCP noted a spot on upper right lung. Here for recommendation from hospital visit. Smokes a tobacco pipe 3X day   Dyspnea:  limited R hip / can do room to room at home maybe 50 ft Cough: some cough/ congestion but not productive never bloody Sleep: 10 degrees with bricks / bed one pillow, has cpap but not using  SABA use: 3-4 x per day despite trelegy  Overt HB despite bed blocks and ppi bid  Rec Plan A = Automatic = Always=    Trelegy one click daily  Plan B = Backup (to supplement plan A, not to replace it) Only use your albuterol inhaler as a rescue medication Ok to try albuterol 15 min before an activity (on alternating days)  that you know would usually make you short of breath  Stop lisinopril and substitute olmesartan 40 mg one daily and many of your symptoms should improve. PFTs should be done next available and I will call you with results > not done Suggested e-cigs as an optional  "one way bridge"  Off all tobacco products     11/02/2021  f/u ov/[place] office/[name] re: RUL pna/ copd/ ? Acei case  maint on trelegy 200 and taking it at noon   Chief Complaint  Patient presents with   Follow-up    F/u visit for surgery discussion. Feels he has improved a little since last OV.   Dyspnea:  100 ft = MMRC3 = can't walk 100 yards even at a slow pace at a flat grade s stopping due to sob   Cough: nasal congestion / using neosynephrine x 2 weeks had been using pseudofed previously  Sleeping: bed blocks / pillow x 1  SABA use: q am (not the instructions given)  02: none  Covid status: vax x 5   Rec Plan A = Automatic = Always=    Trelegy [id number] one click first thing in am and blow out through nose  Plan B = Backup (to supplement plan A, not to replace it) Only use your albuterol inhaler as a rescue medication  Flonase  has no immediate benefit in terms of improving symptoms.     R THR  02/26/22   07/19/2022  f/u ov/[place] office/[name] re: GOLD 3  maint on Trelegy   Chief Complaint  Patient presents with   Follow-up    Pt states he had hip surgery on 02/26/2022. Breathing is doing ok with  some SOB.   Dyspnea:  10 min walks slowed by hips > sob  Cough: minimal in am mucoid Sleeping: flat bed on side one pillow  SABA use: once or twice a day 02: none Covid status: vax all / never infected Lung cancer screening: per Dr Raliegh Ip per pt  Rec Please remember to go to the lab department   for your tests - we will call you with the results when they are available. Depomedrol 120 mg IM today  Ok to try benadryl as needed for itching  Wean off all vaping as soon as possible (one way bridge off all nicotine products)      01/27/2023  f/u ov/ office/[name] re: *** maint on ***  No chief complaint on file.   Dyspnea:  *** Cough: *** Sleeping: *** SABA use: *** 02: *** Covid status: *** Lung cancer screening: ***   No obvious day to day or daytime variability or assoc excess/ purulent sputum or mucus plugs or hemoptysis or cp or chest tightness, subjective wheeze or overt sinus or hb symptoms.   *** without nocturnal  or early am exacerbation  of respiratory  c/o's or need for noct saba. Also denies any obvious fluctuation of symptoms with weather or environmental changes or other aggravating or alleviating factors except as outlined above   No unusual exposure hx or h/o childhood pna/ asthma or knowledge of premature birth.  Current Allergies, Complete Past Medical History, Past Surgical History, Family History, and Social History were reviewed in [employer] record.  ROS  The following are not active complaints unless bolded Hoarseness, sore throat, dysphagia, dental problems, itching, sneezing,  nasal congestion or discharge of excess mucus or purulent secretions, ear ache,   fever, chills, sweats, unintended wt loss or wt gain, classically pleuritic or exertional cp,  orthopnea pnd or arm/hand swelling  or leg swelling, presyncope, palpitations, abdominal pain, anorexia, nausea, vomiting, diarrhea  or change in bowel habits or change in bladder  habits, change in stools or change in urine, dysuria, hematuria,  rash, arthralgias, visual complaints, headache, numbness, weakness or ataxia or problems with walking or coordination,  change in mood or  memory.        No outpatient medications have been marked as taking for the 01/27/23 encounter (Appointment) with Tanda Rockers, MD.                Past Medical History:  Diagnosis Date   Alcohol use    daily   Anxiety    hx (05/05/2018)   Arthritis    "all over" (05/05/2018)   Asthma    Chronic bronchitis ([place])    Dyspnea    Family history of adverse reaction to anesthesia    "mom would have nausea after OR"    GERD (gastroesophageal  reflux disease)    History of hiatal hernia    Hypertension    Pneumonia X 1   PONV (postoperative nausea and vomiting)    Seasonal allergies    "spring, summer, fall"        Objective:    Wts  01/27/2023        ***  07/19/2022       194   11/02/21 196 lb 1.9 oz (89 kg)  10/29/21 192 lb 14.4 oz (87.5 kg)  08/20/21 192 lb (87.1 kg)    Vital signs reviewed  01/27/2023  - Note at rest 02 sats  ***% on ***   General appearance:    ***     Mild bar***            Assessment

## 2023-01-27 ENCOUNTER — Encounter: Payer: Self-pay | Admitting: Internal Medicine

## 2023-01-27 ENCOUNTER — Ambulatory Visit: Payer: Medicare HMO | Admitting: Internal Medicine

## 2023-01-27 VITALS — BP 134/68 | HR 74 | Ht 69.0 in | Wt 189.0 lb

## 2023-01-27 DIAGNOSIS — J449 Chronic obstructive pulmonary disease, unspecified: Secondary | ICD-10-CM | POA: Diagnosis not present

## 2023-01-27 DIAGNOSIS — I10 Essential (primary) hypertension: Secondary | ICD-10-CM

## 2023-01-27 DIAGNOSIS — Z87891 Personal history of nicotine dependence: Secondary | ICD-10-CM | POA: Diagnosis not present

## 2023-01-27 MED ORDER — OLMESARTAN MEDOXOMIL 40 MG PO TABS: 40.0000 mg | ORAL_TABLET | Freq: Every day | ORAL | 0 refills | Status: DC

## 2023-01-27 NOTE — Assessment & Plan Note (Signed)
D/c acei 08/20/2021 due to cough/ copd overlap>  Cough resolved as of 11/02/2021   Adequate control on present rx, reviewed in detail with pt > no change in rx needed  > f/u PCP for refills next ov

## 2023-01-27 NOTE — Assessment & Plan Note (Signed)
Low-dose CT lung cancer screening is recommended for patients who are 15-74 years of age with a 20+ pack-year history of smoking and who are currently smoking or quit <=15 years ago. No coughing up blood  No unintentional weight loss of > 15 pounds in the last 6 months - pt is eligible for scanning yearly until 2037   He just had neg CTa 11/08/22 so referred to lung cancer screening program for yearly f/u then.  We will see in 54 m, sooner prn   Each maintenance medication was reviewed in detail including emphasizing most importantly the difference between maintenance and prns and under what circumstances the prns are to be triggered using an action plan format where appropriate.  Total time for H and P, chart review, counseling, reviewing dpi/hfa device(s) , directly observing portions of ambulatory 02 saturation study/ and generating customized AVS unique to this office visit / same day charting  > 30 min

## 2023-01-27 NOTE — Assessment & Plan Note (Signed)
Quit smoking cigs 06/2021/MM - 08/20/2021  After extensive coaching inhaler device,  effectiveness =  hfa   75% (short ti) > continue dpi trelegy with approp saba prn  - Spirometry 12/24/2021  FEV1 1.40 (43%)  Ratio 0.57 p trelegy prior with classic f/v loop   - Labs ordered 07/19/2022  :  allergy screen IgE 241  0.2    alpha one AT phenotype  MM  Level 149  - 07/19/2022   Walked on RA  x  3  lap(s) =  approx 450  ft  @ fast pace, stopped due to end of study s sob with lowest 02 sats 93%   - 01/27/2023   Walked on RA  x  3  lap(s) =  approx 450  ft  @ mod fast pace with cane stopped due to end of study  with lowest 02 sats 92% s sob  > d/c all 02    Group D (now reclassified as E) in terms of symptom/risk and laba/lama/ICS  therefore appropriate rx at this point >>>  trelegy plus more approp saba  Re SABA :  I spent extra time with pt today reviewing appropriate use of albuterol for prn use on exertion with the following points: 1) saba is for relief of sob that does not improve by walking a slower pace or resting but rather if the pt does not improve after trying this first. 2) If the pt is convinced, as many are, that saba helps recover from activity faster then it's easy to tell if this is the case by re-challenging : ie stop, take the inhaler, then p 5 minutes try the exact same activity (intensity of workload) that just caused the symptoms and see if they are substantially diminished or not after saba 3) if there is an activity that reproducibly causes the symptoms, try the saba 15 min before the activity on alternate days   If in fact the saba really does help, then fine to continue to use it prn but advised may need to look closer at the maintenance regimen being used to achieve better control of airways disease with exertion.

## 2023-01-27 NOTE — Patient Instructions (Addendum)
Cancel the Ct for may and we will refer you the lung cancer screening program and no further follow up with Dr Raliegh Ip needed    Also  [facility] to try albuterol 15 min before an activity (on alternating days)  that you know would usually make you short of breath and see if it makes any difference and if makes none then don't take albuterol after activity unless you can't catch your breath as this means it's the resting that helps, not the albuterol.      We will walk you today to see if your oxygen level is adequate   Please schedule a follow up visit in 12 months but call sooner if needed

## 2023-02-05 ENCOUNTER — Other Ambulatory Visit: Payer: Self-pay | Admitting: Internal Medicine

## 2023-04-29 ENCOUNTER — Ambulatory Visit (HOSPITAL_COMMUNITY)
Admission: RE | Admit: 2023-04-29 | Discharge: 2023-04-29 | Disposition: A | Discharge status: Home / Self Care | Payer: Medicare HMO | Source: Ambulatory Visit | Attending: Hematology | Admitting: Hematology

## 2023-04-29 ENCOUNTER — Inpatient Hospital Stay: Payer: Medicare HMO | Attending: Hematology

## 2023-04-29 DIAGNOSIS — J449 Chronic obstructive pulmonary disease, unspecified: Secondary | ICD-10-CM | POA: Insufficient documentation

## 2023-04-29 DIAGNOSIS — R911 Solitary pulmonary nodule: Secondary | ICD-10-CM

## 2023-04-29 DIAGNOSIS — C3490 Malignant neoplasm of unspecified part of unspecified bronchus or lung: Secondary | ICD-10-CM

## 2023-04-29 DIAGNOSIS — Z87891 Personal history of nicotine dependence: Secondary | ICD-10-CM | POA: Insufficient documentation

## 2023-04-29 DIAGNOSIS — Z7982 Long term (current) use of aspirin: Secondary | ICD-10-CM | POA: Insufficient documentation

## 2023-04-29 DIAGNOSIS — R918 Other nonspecific abnormal finding of lung field: Secondary | ICD-10-CM | POA: Diagnosis present

## 2023-04-29 DIAGNOSIS — Z79899 Other long term (current) drug therapy: Secondary | ICD-10-CM | POA: Diagnosis not present

## 2023-04-29 LAB — CBC WITH DIFFERENTIAL/PLATELET
Abs Immature Granulocytes: 0.12 10*3/uL — ABNORMAL HIGH (ref 0.00–0.07)
Basophils Absolute: 0.1 10*3/uL (ref 0.0–0.1)
Basophils Relative: 1 %
Eosinophils Absolute: 0.2 10*3/uL (ref 0.0–0.5)
Eosinophils Relative: 2 %
HCT: 48.6 % (ref 39.0–52.0)
Hemoglobin: 16.9 g/dL (ref 13.0–17.0)
Immature Granulocytes: 1 %
Lymphocytes Relative: 11 %
Lymphs Abs: 1.2 10*3/uL (ref 0.7–4.0)
MCH: 32.8 pg (ref 26.0–34.0)
MCHC: 34.8 g/dL (ref 30.0–36.0)
MCV: 94.4 fL (ref 80.0–100.0)
Monocytes Absolute: 1.3 10*3/uL — ABNORMAL HIGH (ref 0.1–1.0)
Monocytes Relative: 13 %
Neutro Abs: 7.6 10*3/uL (ref 1.7–7.7)
Neutrophils Relative %: 72 %
Platelets: 352 10*3/uL (ref 150–400)
RBC: 5.15 MIL/uL (ref 4.22–5.81)
RDW: 12.1 % (ref 11.5–15.5)
WBC: 10.5 10*3/uL (ref 4.0–10.5)
nRBC: 0 % (ref 0.0–0.2)

## 2023-04-29 LAB — COMPREHENSIVE METABOLIC PANEL
ALT: 39 U/L (ref 0–44)
AST: 31 U/L (ref 15–41)
Albumin: 4 g/dL (ref 3.5–5.0)
Alkaline Phosphatase: 76 U/L (ref 38–126)
Anion gap: 13 (ref 5–15)
BUN: 6 mg/dL — ABNORMAL LOW (ref 8–23)
CO2: 21 mmol/L — ABNORMAL LOW (ref 22–32)
Calcium: 8.9 mg/dL (ref 8.9–10.3)
Chloride: 97 mmol/L — ABNORMAL LOW (ref 98–111)
Creatinine, Ser: 0.89 mg/dL (ref 0.61–1.24)
GFR, Estimated: >60 mL/min (ref >60)
Glucose, Bld: 103 mg/dL — ABNORMAL HIGH (ref 70–99)
Potassium: 4 mmol/L (ref 3.5–5.1)
Sodium: 131 mmol/L — ABNORMAL LOW (ref 135–145)
Total Bilirubin: 0.8 mg/dL (ref 0.3–1.2)
Total Protein: 7 g/dL (ref 6.5–8.1)

## 2023-05-05 NOTE — Progress Notes (Unsigned)
[facility] [address], [place] [address]   CLINIC:  Medical Oncology/Hematology  PCP:  Elfredia Nevins, MD [address] [place] [address] [phone]   REASON FOR VISIT:  Follow-up for left lung nodule and possible right lung mass.   PRIOR THERAPY: None  CURRENT THERAPY: Surveillance   INTERVAL HISTORY:   Mr. Schrupp 72 y.o. male returns for routine follow-up of left lung nodule and possible right lung mass.  He was last seen by Dr. Ellin Saba on 05/06/2022.  At today's visit, he reports feeling fairly well.  He was hospitalized for upper respiratory infection and COPD exacerbation in December 2023.  He has some underlying dyspnea on exertion from COPD and uses as needed albuterol inhaler.  He denies any shortness of breath at rest or changes in his respiratory status.  He does not have any chest pain, new onset cough, hemoptysis, or unintentional weight loss.  He has not noticed any masses or lymphadenopathy.  He denies any new onset unexplained musculoskeletal pain.  He has 75% energy and 100% appetite. He endorses that he is maintaining a stable weight.  ASSESSMENT & PLAN:  1.  Left lung nodule and possible right lung mass - Referred to Cancer Center in July/August 2022 after abnormal findings noted on CT chest from 06/27/2021 - Hospitalized from 06/14/2021 through 06/16/2021 with pneumonia and was treated with [facility], then presented back to the ED on 06/27/2021 with fever.  CT findings as below.  Completed 7-day course of Levaquin with improved symptoms. - CT chest w/o contrast on 06/27/2021 showed patchy irregular-appearing airspace disease seen within the lateral aspect of the right upper lobe.  1.2 x 1 cm irregular left upper lobe nodule noted.  Several areas of focal scarring within the posteromedial aspect of the right upper lobe and posterior aspect of the left upper lobe.  Multiple 4 mm noncalcified lung nodule seen within the posterior aspect  of the left lower lobe.  2.7 x 1.0 cm pleural-based area of low-attenuation seen in the posterior aspect of the right lung base. - PET scan (07/19/2021) with findings that could be consistent with infectious, inflammatory, or neoplastic process. - CT chest with contrast (10/22/2021): No definitive findings to suggest neoplasm.  Findings suggest postinfectious or inflammatory scarring in lungs, but with repeat CT chest recommended in 6 months. - Reviewed CT chest without contrast on 04/30/2022: Stable 3 mm left lower lobe pulmonary nodule consistent with a benign etiology and stable bilateral upper lobe pleural-parenchymal scarring and emphysema. - CT chest without contrast (04/29/2023): Stable 3 mm subpleural left lower lobe pulmonary nodule, benign.  No follow-up is recommended. - Labs (04/29/2023) with unremarkable CBC/D and normal LFTs. - No new onset respiratory symptoms, hemoptysis, or unintentional weight loss  - PLAN: Given long-term demonstration of stability of previously noted lung abnormalities (initially noted in the setting of pneumonia), we will tentatively discharge patient from clinic. - He should continue with annual lung screening for as long as he qualifies, and has been referred for LDCT chest in 1 year. - He should continue close follow-up with pulmonology (Dr. Sherene Sires)  2.  History of tobacco use - Smoked 1 PPD x 62 years, quit on 06/13/2021 -- He vapes daily - PLAN: Patient will qualify for annual LDCT chest for LCS until his age is >77.  3.  COPD - Uses multiple inhalers, following with Dr. Sherene Sires  4.  Social/family history: - Lives with wife at home. - He quit smoking on  06/13/2021.  Prior to that he smoked 1 pack/day for 62 years. - He drinks 3-4 shots of whiskey each night.  Cutting back on alcohol discussed at office visit on 05/06/2023. - Has exposure to asbestos on and off for 5 to 6 years. 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 - Father had lung cancer.  PLAN SUMMARY: >> LDCT chest approximately 04/29/2024 + Referral to Lung Cancer Screening program >> Tentatively discharge from clinic     REVIEW OF SYSTEMS:   Review of Systems  Constitutional:  Negative for appetite change, chills, diaphoresis, fatigue, fever and unexpected weight change.  HENT:   Negative for lump/mass and nosebleeds.   Eyes:  Negative for eye problems.  Respiratory:  Positive for shortness of breath. Negative for cough and hemoptysis.   Cardiovascular:  Negative for chest pain, leg swelling and palpitations.  Gastrointestinal:  Positive for diarrhea. Negative for abdominal pain, blood in stool, constipation, nausea and vomiting.  Genitourinary:  Negative for hematuria.   Skin: Negative.   Neurological:  Positive for numbness. Negative for dizziness, headaches and light-headedness.  Hematological:  Does not bruise/bleed easily.     PHYSICAL EXAM:  ECOG PERFORMANCE STATUS: 1 - Symptomatic but completely ambulatory  There were no vitals filed for this visit. There were no vitals filed for this visit. Physical Exam Constitutional:      Appearance: Normal appearance. He is obese.  Cardiovascular:     Heart sounds: Normal heart sounds.  Pulmonary:     Breath sounds: Decreased air movement present. Decreased breath sounds present.  Neurological:     General: No focal deficit present.     Mental Status: Mental status is at baseline.  Psychiatric:        Behavior: Behavior normal. Behavior is cooperative.    PAST MEDICAL/SURGICAL HISTORY:  Past Medical History:  Diagnosis Date   Alcohol use    daily   Anxiety    hx (05/05/2018)   Arthritis    "all over" (05/05/2018)   Asthma    Chronic bronchitis (HCC)    COPD (chronic obstructive pulmonary disease) (HCC)    Dyspnea    Family history of adverse reaction to anesthesia    "mom would have nausea after OR"    GERD (gastroesophageal reflux disease)    History of  hiatal hernia    Hypertension    Pneumonia X 1   PONV (postoperative nausea and vomiting)    Seasonal allergies    "spring, summer, fall"   Sleep apnea    Past Surgical History:  Procedure Laterality Date   ANTERIOR CERVICAL DECOMP/DISCECTOMY FUSION Left    BACK SURGERY     COLONOSCOPY     JOINT REPLACEMENT     LAPAROSCOPIC CHOLECYSTECTOMY     LUMBAR DISC SURGERY  X 2   L4-5   NASAL POLYP EXCISION  ~ 1970   TONSILLECTOMY     TOTAL HIP ARTHROPLASTY Left 05/05/2018   TOTAL HIP ARTHROPLASTY Left 05/05/2018   Procedure: LEFT TOTAL HIP ARTHROPLASTY ANTERIOR APPROACH;  Surgeon: Sheral Apley, MD;  Location: [facility] OR;  Service: Orthopedics;  Laterality: Left;   TOTAL HIP ARTHROPLASTY Right 02/26/2022   Procedure: TOTAL HIP ARTHROPLASTY ANTERIOR APPROACH;  Surgeon: Sheral Apley, MD;  Location: [facility] ORS;  Service: Orthopedics;  Laterality: Right;    SOCIAL HISTORY:  Social History   Socioeconomic History   Marital status: Married    Spouse name: Not on file  Number of children: 2   Years of education: Not on file   Highest education level: Not on file  Occupational History   Occupation: Retired   Occupation: retired  Tobacco Use   Smoking status: Former    Packs/day: 1.00    Years: 60.00    Additional pack years: 0.00    Total pack years: 60.00    Types: Cigarettes, Pipe    Quit date: 06/13/2021    Years since quitting: 1.8   Smokeless tobacco: Never  Vaping Use   Vaping Use: Every day  Substance and Sexual Activity   Alcohol use: Yes    Alcohol/week: 3.0 standard drinks of alcohol    Types: 3 Shots of liquor per week    Comment: 3 drinks per day   Drug use: Never   Sexual activity: Not Currently  Other Topics Concern   Not on file  Social History Narrative   Not on file   Social Determinants of Health   Financial Resource Strain: Low Risk  (07/03/2021)   Overall Financial Resource Strain (CARDIA)    Difficulty of Paying Living Expenses: Not hard at all   Food Insecurity: No Food Insecurity (11/09/2022)   Hunger Vital Sign    Worried About Running Out of Food in the Last Year: Never true    Ran Out of Food in the Last Year: Never true  Transportation Needs: No Transportation Needs (11/09/2022)   PRAPARE - Administrator, Civil Service (Medical): No    Lack of Transportation (Non-Medical): No  Physical Activity: Inactive (07/03/2021)   Exercise Vital Sign    Days of Exercise per Week: 0 days    Minutes of Exercise per Session: 0 min  Stress: No Stress Concern Present (07/03/2021)   [employer] of Occupational Health - Occupational Stress Questionnaire    Feeling of Stress : Not at all  Social Connections: Moderately Integrated (07/03/2021)   Social Connection and Isolation Panel [NHANES]    Frequency of Communication with Friends and Family: More than three times a week    Frequency of Social Gatherings with Friends and Family: More than three times a week    Attends Religious Services: Never    Database administrator or Organizations: Yes    Attends Engineer, structural: More than 4 times per year    Marital Status: Married  Catering manager Violence: Not At Risk (11/09/2022)   Humiliation, Afraid, Rape, and Kick questionnaire    Fear of Current or Ex-Partner: No    Emotionally Abused: No    Physically Abused: No    Sexually Abused: No    FAMILY HISTORY:  Family History  Problem Relation Age of Onset   Heart disease Mother    Heart disease Father     CURRENT MEDICATIONS:  Outpatient Encounter Medications as of 05/06/2023  Medication Sig Note   acetaminophen (TYLENOL) 500 MG tablet Take 2 tablets (1,000 mg total) by mouth every 6 (six) hours as needed for moderate pain or mild pain.    Albuterol Sulfate (PROAIR HFA IN) Inhale 2 puffs into the lungs daily as needed (shortness of breath).    ALPRAZolam (XANAX) 0.5 MG tablet Take 0.25-0.5 mg by mouth 3 (three) times daily as needed for anxiety.    amLODipine  (NORVASC) 10 MG tablet Take 10 mg by mouth daily.    aspirin EC 81 MG tablet Take 1 tablet (81 mg total) by mouth 2 (two) times daily. For DVT prophylaxis for 30  days after surgery. (Patient taking differently: Take 81 mg by mouth daily. Marland Kitchen)    Cholecalciferol (VITAMIN D) 50 MCG (2000 UT) CAPS Take 4,000 Units by mouth daily.    cyanocobalamin (,VITAMIN B-12,) 1000 MCG/ML injection Inject 1,000 mcg into the muscle every 30 (thirty) days. 02/07/2022: Given at Dr office    dextromethorphan-guaiFENesin [facility] DM) 30-600 MG 12hr tablet Take 1 tablet by mouth 2 (two) times daily.    diltiazem (CARDIZEM CD) 360 MG 24 hr capsule Take 360 mg by mouth daily.    Fluticasone-Umeclidin-Vilant (TRELEGY ELLIPTA) 100-62.5-25 MCG/ACT AEPB INHALE 1 PUFF INTO THE LUNGS FIRST THING EACH MORNING.    folic acid (FOLVITE) 1 MG tablet Take 1 tablet (1 mg total) by mouth daily.    furosemide (LASIX) 20 MG tablet Take 20 mg by mouth daily as needed.    Glucosamine HCl (GLUCOSAMINE PO) Take 2 tablets by mouth daily.    loperamide (IMODIUM) 2 MG capsule Take 1 capsule (2 mg total) by mouth 4 (four) times daily as needed for diarrhea or loose stools.    meloxicam (MOBIC) 7.5 MG tablet Take 7.5 mg by mouth daily.    olmesartan (BENICAR) 40 MG tablet TAKE ONE TABLET BY MOUTH ONCE DAILY.    pantoprazole (PROTONIX) 40 MG tablet Take 40 mg by mouth daily.    potassium chloride SA (KLOR-CON M) 20 MEQ tablet Take 20 mEq by mouth as needed.    thiamine (VITAMIN B-1) 100 MG tablet Take 1 tablet (100 mg total) by mouth daily.    TURMERIC PO Take 1 capsule by mouth daily.    vitamin E 180 MG (400 UNITS) capsule Take 400 Units by mouth daily.    No facility-administered encounter medications on file as of 05/06/2023.    ALLERGIES:  Allergies  Allergen Reactions   Penicillins Other (See Comments)    Has patient had a PCN reaction causing immediate rash, facial/tongue/throat swelling, SOB or lightheadedness with hypotension:##Yes# but  pt states he is ok with amoxicillin. Has patient had a PCN reaction causing severe rash involving mucus membranes or skin necrosis: No Has patient had a PCN reaction that required hospitalization: No Has patient had a PCN reaction occurring within the last 10 years: No If all of the above answers are "NO", then may proceed with Cephalosporin use.     Tape Other (See Comments)    Skin tears and bruises-use paper tape    LABORATORY DATA:  I have reviewed the labs as listed.  CBC    Component Value Date/Time   WBC 10.5 04/29/2023 1251   RBC 5.15 04/29/2023 1251   HGB 16.9 04/29/2023 1251   HGB 15.9 07/19/2022 1153   HCT 48.6 04/29/2023 1251   HCT 45.6 07/19/2022 1153   PLT 352 04/29/2023 1251   PLT 324 07/19/2022 1153   MCV 94.4 04/29/2023 1251   MCV 93 07/19/2022 1153   MCH 32.8 04/29/2023 1251   MCHC 34.8 04/29/2023 1251   RDW 12.1 04/29/2023 1251   RDW 13.3 07/19/2022 1153   LYMPHSABS 1.2 04/29/2023 1251   LYMPHSABS 1.0 07/19/2022 1153   MONOABS 1.3 (H) 04/29/2023 1251   EOSABS 0.2 04/29/2023 1251   EOSABS 0.2 07/19/2022 1153   BASOSABS 0.1 04/29/2023 1251   BASOSABS 0.1 07/19/2022 1153      Latest Ref Rng & Units 04/29/2023   12:51 PM 11/10/2022    4:41 AM 11/09/2022    4:55 AM  CMP  Glucose 70 - 99 mg/dL 161  096  130   BUN 8 - 23 mg/dL 6  13  11    Creatinine 0.61 - 1.24 mg/dL 6.96  2.95  2.84   Sodium 135 - 145 mmol/L 131  129  128   Potassium 3.5 - 5.1 mmol/L 4.0  3.9  4.2   Chloride 98 - 111 mmol/L 97  96  95   CO2 22 - 32 mmol/L 21  26  24    Calcium 8.9 - 10.3 mg/dL 8.9  8.2  8.5   Total Protein 6.5 - 8.1 g/dL 7.0     Total Bilirubin 0.3 - 1.2 mg/dL 0.8     Alkaline Phos 38 - 126 U/L 76     AST 15 - 41 U/L 31     ALT 0 - 44 U/L 39       DIAGNOSTIC IMAGING:  I have independently reviewed the relevant imaging and discussed with the patient.   WRAP UP:  All questions were answered. The patient knows to call the clinic with any problems, questions or  concerns.  Medical decision making: Low  Time spent on visit: I spent 15 minutes counseling the patient face to face. The total time spent in the appointment was 22 minutes and more than 50% was on counseling.  Carnella Guadalajara, PA-C  05/06/2023 4:00 PM

## 2023-05-06 ENCOUNTER — Inpatient Hospital Stay: Payer: Medicare HMO | Attending: Physician Assistant | Admitting: Physician Assistant

## 2023-05-06 VITALS — BP 138/68 | HR 76 | Temp 98.6°F | Resp 16 | Wt 196.0 lb

## 2023-05-06 DIAGNOSIS — F17290 Nicotine dependence, other tobacco product, uncomplicated: Secondary | ICD-10-CM | POA: Insufficient documentation

## 2023-05-06 DIAGNOSIS — Z801 Family history of malignant neoplasm of trachea, bronchus and lung: Secondary | ICD-10-CM | POA: Diagnosis not present

## 2023-05-06 DIAGNOSIS — J449 Chronic obstructive pulmonary disease, unspecified: Secondary | ICD-10-CM | POA: Insufficient documentation

## 2023-05-06 DIAGNOSIS — R911 Solitary pulmonary nodule: Secondary | ICD-10-CM | POA: Diagnosis not present

## 2023-05-06 DIAGNOSIS — Z77090 Contact with and (suspected) exposure to asbestos: Secondary | ICD-10-CM | POA: Diagnosis not present

## 2023-05-06 DIAGNOSIS — Z87891 Personal history of nicotine dependence: Secondary | ICD-10-CM

## 2023-05-06 DIAGNOSIS — R918 Other nonspecific abnormal finding of lung field: Secondary | ICD-10-CM | POA: Diagnosis present

## 2023-05-06 NOTE — Patient Instructions (Signed)
[facility] at [facility] **VISIT SUMMARY & IMPORTANT INSTRUCTIONS **   You were seen today by Rojelio Brenner PA-C for your history of lung nodules.  Your most recent CT scan did NOT show any signs of lung cancer. Due to your history of smoking, you remain at risk for developing lung cancer in the future. You qualify for the Lung Cancer Screening Program, which will allow you to receive low-dose CT scan of your chest once a year. We will schedule your CT scan to be done next year, but you will not need to see [place] at the office again. You should continue to follow-up with Dr. Sherene Sires for your COPD and other lung concerns.   ** Thank you for trusting me with your healthcare!  I strive to provide all of my patients with quality care at each visit.  If you receive a survey for this visit, I would be so grateful to you for taking the time to provide feedback.  Thank you in advance!  ~ Lennox Dolberry                   Dr. Doreatha Massed   &   Rojelio Brenner, PA-C   - - - - - - - - - - - - - - - - - -    Thank you for choosing [facility] at [facility] to provide your oncology and hematology care.  To afford each patient quality time with our provider, please arrive at least 15 minutes before your scheduled appointment time.   If you have a lab appointment with the Cancer Center please come in thru the Main Entrance and check in at the main information desk.  You need to re-schedule your appointment should you arrive 10 or more minutes late.  We strive to give you quality time with our providers, and arriving late affects you and other patients whose appointments are after yours.  Also, if you no show three or more times for appointments you may be dismissed from the clinic at the providers discretion.     Again, thank you for choosing [facility].  Our hope is that these requests will decrease the amount of time that you wait before being  seen by our physicians.       _____________________________________________________________  Should you have questions after your visit to [facility], please contact our office at [phone] and follow the prompts.  Our office hours are 8:00 a.m. and 4:30 p.m. Monday - Friday.  Please note that voicemails left after 4:00 p.m. may not be returned until the following business day.  We are closed weekends and major holidays.  You do have access to a nurse 24-7, just call the main number to the clinic [phone] and do not press any options, hold on the line and a nurse will answer the phone.    For prescription refill requests, have your pharmacy contact our office and allow 72 hours.

## 2023-07-29 ENCOUNTER — Other Ambulatory Visit: Payer: Self-pay | Admitting: Internal Medicine

## 2023-10-12 ENCOUNTER — Other Ambulatory Visit: Payer: Self-pay | Admitting: Internal Medicine

## 2024-01-25 NOTE — Progress Notes (Unsigned)
 Jeremy [name], male    DOB: [birth year]   MRN: [id number]   Brief patient profile:  73 yowm  MM/quit smoking cigarettes 06/2021 and stopped pipe 11/2021 Jeremy [name] vaping   referred to pulmonary clinic in [facility]  08/20/2021 by Dr  Rollen Sox)   s/p admit for CAP with residual dz ? All post inflammatory acute lung injury   Admit date: 06/14/2021  Discharge date: 06/16/21     Brief/Interim Summary: Hx significant of COPD, essential hypertension, tobacco use disorder, alcohol use disorder, GERD, history of lumbar surgery, history of cervical spinal surgery, primary osteoarthritis of hip s/p left total hip arthroplasty presents with shortness of breath with associated fever, productive sputum of 2 day duration found to have interstitial pneumonia on CXR. Given patient's older age, history of COPD, hypoxia, fever, and hyponatremia, PSI/PORT score (107) indicated moderate mortality risk patient was hospitalized for inpatient treatment of pneumonia. Patient was de satting at 87% on room air and improved to 94% on 2L [place]. Patient received prednisone 40mg , antibiotics Rocephin and azithromycin IV. Blood culture was obtained showing...    Overnight, patient improved significantly with absence of fever for >15 hours. Patient tried exertion with and without oxygen to determine oxygen demand... Tobacco cessation counseling was provided at bedside.      Discharge Diagnoses:    Acute hypoxemic respiratory failure (HCC)   Community acquired pneumonia   Acute on chronic respiratory failure secondary to COPD exacerbation and community acquired pneumonia    History of Present Illness  08/20/2021  Pulmonary/ 1st office eval/ Miarose Lippert / [place] [facility] on ACEi  Chief Complaint  Patient presents with   Consult    Pneumonia in the hospital 2X. Er doctor noted some places on the lung after Chest CT. PCP noted a spot on upper right lung. Here for recommendation from hospital visit. Smokes a tobacco pipe 3X day   Dyspnea:  limited R hip / can do room to room at home maybe 50 ft Cough: some cough/ congestion but not productive never bloody Sleep: 10 degrees with bricks / bed one pillow, has cpap but not using  SABA use: 3-4 x per day despite trelegy  Overt HB despite bed blocks and ppi bid  Rec Plan A = Automatic = Always=    Trelegy one click daily  Plan B = Backup (to supplement plan A, not to replace it) Only use your albuterol inhaler as a rescue medication Ok to try albuterol 15 min before an activity (on alternating days)  that you know would usually make you short of breath  Stop lisinopril and substitute olmesartan 40 mg one daily and many of your symptoms should improve. PFTs should be done next available and I will call you with results > not done Suggested e-cigs as an optional  "one way bridge"  Off all tobacco products     11/02/2021  f/u ov/ office/[name] re: RUL pna/ copd/ ? Acei case  maint on trelegy 200 and taking it at noon   Chief Complaint  Patient presents with   Follow-up    F/u visit for surgery discussion. Feels he has improved a little since last OV.   Dyspnea:  100 ft = MMRC3 = can't walk 100 yards even at a slow pace at a flat grade s stopping due to sob   Cough: nasal congestion / using neosynephrine x 2 weeks had been using pseudofed previously  Sleeping: bed blocks / pillow x 1  SABA use: q am (not the  instructions given)    02: none  Covid status: vax x 5   Rec Plan A = Automatic = Always=    Trelegy 100 one click first thing in am and blow out through nose  Plan B = Backup (to supplement plan A, not to replace it) Only use your albuterol inhaler as a rescue medication  Flonase  has no immediate benefit in terms of improving symptoms.     R THR  02/26/22   07/19/2022  f/u ov/[place] office/[name] re: GOLD 3  maint on Trelegy   Chief Complaint  Patient presents with   Follow-up    Pt states he had hip surgery on 02/26/2022. Breathing is doing ok with  some SOB.   Dyspnea:  10 min walks slowed by hips > sob  Cough: minimal in am mucoid Sleeping: flat bed on side one pillow  SABA use: once or twice a day 02: none Covid status: vax all / never infected Lung cancer screening: per Dr Kirtland Bouchard per pt  Rec Please remember to go to the lab department   for your tests - we will call you with the results when they are available. Depomedrol 120 mg IM today  Ok to try benadryl as needed for itching  Wean off all vaping as soon as possible (one way bridge off all nicotine products)      Admit date:     11/08/2022  Discharge date: 11/11/22   Discharge Diagnoses: Principal Problem:   COPD exacerbation (HCC)    Essential hypertension   Acute hypoxemic respiratory failure (HCC)   Hyponatremia   GERD   Alcohol abuse.     Hospital Course: As per H&P written by Dr. Randol Kern on 11/08/2022 Jeremy [name]  is a 73 y.o. male, with past medical history of COPD, hypertension, alcohol abuse, he presents to ED secondary to dyspnea, reports over the last 5 to 7 days he started to have some dyspnea, cough, sore throat, was prescribed azithromycin, guaifenesin/codeine, without much improvement of his symptoms, reports he was taking Tylenol, unsure if he had any fever, reports poor appetite, significant wheezing, cough, productive sputum, blood-streaked sometimes, reported drinks 4-5 shots of whiskey every afternoon, reports he recently stopped smoking but started to vape instead. -In ED patient was hypoxic 85% on room air, CTA chest with no evidence of PE, or pneumonia, he had significant wheezing improved with steroids, but remains hypoxic on 2 L nasal cannula    Assessment and Plan: Acute hypoxic respiratory failure COPD exacerbation -Patient presents with dyspnea, significant wheezing, cough with productive phlegm -85-86% on room air initially, on 2 L nasal cannula, he remains with oxygen requirement at time of discharge, especially with activity. -Will  continue treatment with antibiotics, steroids tapering, mucolytic's/antitussive regimen, the use of flutter valve and bronchodilator management. -Outpatient follow-up with pulmonology service recommended. -CT demonstrating no pulmonary embolism. -Stable and improved at time of discharge; patient has been advised to stop vaping.   Hyponatremia -Due to volume depletion, and the use of chronic alcohol use. -Patient vies to maintain adequate hydration -Repeat basic metabolic panel follow-up visit to assess electrolytes stability. -Continue just as needed diuretic for edema and swelling; daily HCTZ has been discontinued.   Alcohol abuse -CIWA protocol use while hospitalized; no withdrawal symptoms appreciated -Cessation counseling provided -Continue folic acid and thiamine.   History of tobacco use  -Patient reports he is smoking no more, but currently vaping.   -Cessation counseling provided.   GERD -continue with PPI- -lifestyle changes  discussed with patient.   Hypertension -Stable and improved at discharge -Resume home antihypertensive regimen -Patient advised to follow heart healthy diet.       Consultants:  Procedures performed: See below for x-ray report Disposition: Home Diet recommendation: Heart healthy diet   DISCHARGE MEDICATION: Allergies as of 11/11/2022      01/27/2023  f/u ov/[place] office/Zaveon Gillen re: GOLD 3 copd maint on telegy   Chief Complaint  Patient presents with   Follow-up    Would like 90 days supply on olmesartan  Had hosp admission 12/9-12/11 COPD was sent home wit O2 but doing much better and does not feel like he needs it.   Dyspnea:  3 min walk outside due to R hip  Cough: gone  Sleeping: bed blocks / one pillow do resp cc  SABA LKG:MWNUU before ex twice daily , has not tried s pre rx 02: off of 02 for several  Covid status: x 5  Lung cancer screening: referred today  Rec Cancel the Ct for May and we will refer you the lung cancer  screening program and no further follow up with Dr Kirtland Bouchard needed  Also  [employer] to try albuterol 15 min before an activity (on alternating days)  that you know would usually make you short of breath and see if it makes any difference and if makes none then don't take albuterol after activity unless you can't catch your breath as this means it's the resting that helps, not the albuterol.  We will walk you today to see if your oxygen level is adequate   Please schedule a follow up visit in 12 months but call sooner if needed     01/27/2024 12 m f/u ov/[place] office/Teasia Zapf re: GOLD 3 copd  maint on trelegy 100   Chief Complaint  Patient presents with   Follow-up  Dyspnea:   hips /back slowing him down  Cough: just in am's clears after  Sleeping: bed blocks    resp cc  SABA use: twice daily  02: prn but not using - sats usually 89% or greater on his self-monitor  Lung cancer screening: each May    No obvious day to day or daytime variability or assoc excess/ purulent sputum or mucus plugs or hemoptysis or cp or chest tightness, subjective wheeze or overt sinus or hb symptoms.    Also denies any obvious fluctuation of symptoms with weather or environmental changes or other aggravating or alleviating factors except as outlined above   No unusual exposure hx or h/o childhood pna/ asthma or knowledge of premature birth.  Current Allergies, Complete Past Medical History, Past Surgical History, Family History, and Social History were reviewed in [employer] record.  ROS  The following are not active complaints unless bolded Hoarseness, sore throat, dysphagia, dental problems, itching, sneezing,  nasal congestion or discharge of excess mucus or purulent secretions, ear ache,   fever, chills, sweats, unintended wt loss or wt gain, classically pleuritic or exertional cp,  orthopnea pnd or arm/hand swelling  or leg swelling, presyncope, palpitations, abdominal pain, anorexia, nausea,  vomiting, diarrhea  or change in bowel habits or change in bladder habits, change in stools or change in urine, dysuria, hematuria,  rash, arthralgias, visual complaints, headache, numbness, weakness or ataxia or problems with walking or coordination,  change in mood or  memory.        Current Meds  Medication Sig   acetaminophen (TYLENOL) 500 MG tablet Take 2 tablets (1,000 mg total)  by mouth every 6 (six) hours as needed for moderate pain or mild pain. (Patient taking differently: Take 1,000 mg by mouth as needed for moderate pain (pain score 4-6) or mild pain (pain score 1-3).)   Albuterol Sulfate (PROAIR HFA IN) Inhale 2 puffs into the lungs daily as needed (shortness of breath).   ALPRAZolam (XANAX) 0.5 MG tablet Take 0.25-0.5 mg by mouth daily.   amLODipine (NORVASC) 10 MG tablet Take 10 mg by mouth daily.   aspirin EC 81 MG tablet Take 1 tablet (81 mg total) by mouth 2 (two) times daily. For DVT prophylaxis for 30 days after surgery. (Patient taking differently: Take 81 mg by mouth daily. Marland Kitchen)   Cholecalciferol (VITAMIN D) 50 MCG (2000 UT) CAPS Take 4,000 Units by mouth daily.   cyanocobalamin (,VITAMIN B-12,) 1000 MCG/ML injection Inject 1,000 mcg into the muscle every 30 (thirty) days.   dextromethorphan-guaiFENesin (MUCINEX DM) 30-600 MG 12hr tablet Take 1 tablet by mouth 2 (two) times daily.   diltiazem (CARDIZEM CD) 360 MG 24 hr capsule Take 360 mg by mouth daily.   folic acid (FOLVITE) 1 MG tablet Take 1 tablet (1 mg total) by mouth daily.   furosemide (LASIX) 20 MG tablet Take 20 mg by mouth daily as needed.   Glucosamine HCl (GLUCOSAMINE PO) Take 2 tablets by mouth daily.   loperamide (IMODIUM) 2 MG capsule Take 1 capsule (2 mg total) by mouth 4 (four) times daily as needed for diarrhea or loose stools.   meloxicam (MOBIC) 7.5 MG tablet Take 7.5 mg by mouth daily.   olmesartan (BENICAR) 40 MG tablet TAKE ONE TABLET BY MOUTH ONCE DAILY.   pantoprazole (PROTONIX) 40 MG tablet Take 40  mg by mouth daily.   potassium chloride SA (KLOR-CON M) 20 MEQ tablet Take 20 mEq by mouth as needed.   thiamine (VITAMIN B-1) 100 MG tablet Take 1 tablet (100 mg total) by mouth daily.   TRELEGY ELLIPTA 100-62.5-25 MCG/ACT AEPB INHALE 1 PUFF INTO THE LUNGS FIRST THING EACH MORNING.   TURMERIC PO Take 1 capsule by mouth daily.   vitamin E 180 MG (400 UNITS) capsule Take 400 Units by mouth daily.              Past Medical History:  Diagnosis Date   Alcohol use    daily   Anxiety    hx (05/05/2018)   Arthritis    "all over" (05/05/2018)   Asthma    Chronic bronchitis (HCC)    Dyspnea    Family history of adverse reaction to anesthesia    "mom would have nausea after OR"    GERD (gastroesophageal reflux disease)    History of hiatal hernia    Hypertension    Pneumonia X 1   PONV (postoperative nausea and vomiting)    Seasonal allergies    "spring, summer, fall"        Objective:    Wts  01/27/2024       198  01/27/2023       189  07/19/2022       194   11/02/21 196 lb 1.9 oz (89 kg)  10/29/21 192 lb 14.4 oz (87.5 kg)  08/20/21 192 lb (87.1 kg)    Vital signs reviewed  01/27/2024  - Note at rest 02 sats  92% on RA   General appearance:  hoarse   amb wm nad    HEENT : Oropharynx  clear   Nasal turbinates nl    NECK :  without  apparent JVD/ palpable Nodes/TM    LUNGS: no acc muscle use,  Mild barrel  contour chest wall with bilateral  Distant bs s audible wheeze and  without cough on insp or exp maneuvers  and mild  Hyperresonant  to  percussion bilaterally     CV:  RRR  no s3 or murmur or increase in P2, and no edema   ABD: obese soft and nontender   MS:  very awkward slow gait ext warm without deformities Or obvious joint restrictions  calf tenderness, cyanosis or clubbing     SKIN: warm and dry without lesions    NEURO:  alert, approp, nl sensorium with  no motor or cerebellar deficits apparent.              I personally reviewed images and agree with  radiology impression as follows:   Chest LDSCT     5/ 28/24 Severe centrilobular and paraseptal emphysematous changes, upper lung predominant   Assessment

## 2024-01-27 ENCOUNTER — Ambulatory Visit: Payer: Medicare HMO | Admitting: Internal Medicine

## 2024-01-27 ENCOUNTER — Encounter: Payer: Self-pay | Admitting: Internal Medicine

## 2024-01-27 VITALS — BP 138/73 | HR 76 | Ht 69.0 in | Wt 198.8 lb

## 2024-01-27 DIAGNOSIS — J449 Chronic obstructive pulmonary disease, unspecified: Secondary | ICD-10-CM

## 2024-01-27 DIAGNOSIS — Z87891 Personal history of nicotine dependence: Secondary | ICD-10-CM

## 2024-01-27 MED ORDER — BREZTRI AEROSPHERE 160-9-4.8 MCG/ACT IN AERO: 2.0000 | INHALATION_SPRAY | Freq: Two times a day (BID) | RESPIRATORY_TRACT | 11 refills | Status: AC

## 2024-01-27 NOTE — Assessment & Plan Note (Signed)
 Low-dose CT lung cancer screening is recommended for patients who are 2-75 years of age with a 20+ pack-year history of smoking and who are currently smoking or quit <=15 years ago. No coughing up blood  No unintentional weight loss of > 15 pounds in the last 6 months - pt is eligible for scanning yearly until age 80 > due q May /advised    Each maintenance medication was reviewed in detail including emphasizing most importantly the difference between maintenance and prns and under what circumstances the prns are to be triggered using an action plan format where appropriate.  Total time for H and P, chart review, counseling, reviewing hfa/dpi/02/pulse ox  device(s) , directly observing portions of ambulatory 02 saturation study/ and generating customized AVS unique to this office visit / same day charting = 34 min

## 2024-01-27 NOTE — Assessment & Plan Note (Signed)
 Quit smoking cigs 06/2021/MM - 08/20/2021  After extensive coaching inhaler device,  effectiveness =  hfa   75% (short ti) > continue dpi trelegy with approp saba prn  - Spirometry 12/24/2021  FEV1 1.40 (43%)  Ratio 0.57 p trelegy prior with classic f/v loop   - Labs ordered 07/19/2022  :  allergy screen IgE 241  0.2    alpha one AT phenotype  MM  Level 149  - 07/19/2022   Walked on RA  x  3  lap(s) =  approx 450  ft  @ fast pace, stopped due to end of study s sob with lowest 02 sats 93%   - 01/27/2023   Walked on RA  x  3  lap(s) =  approx 450  ft  @ mod fast pace with cane stopped due to end of study  with lowest 02 sats 92% s sob  > d/c all 02 > did not do  - 01/27/2024  After extensive coaching inhaler device,  effectiveness =    70% (short Ti) try change to breztri due to cough/hoarseness  on trelegy  - 01/27/2024   Walked on RA  x  1  lap(s) =  approx 150  ft  @ slow/awkward pace, stopped due to back/hip pain with lowest 02 sats 91%    Group D (now reclassified as E) in terms of symptom/risk and laba/lama/ICS  therefore appropriate rx at this point >>>  breztri may be a better fit due to upper airway symptoms and excess saba need but if not can resume trelegy   Re SABA :  I spent extra time with pt today reviewing appropriate use of albuterol for prn use on exertion with the following points: 1) saba is for relief of sob that does not improve by walking a slower pace or resting but rather if the pt does not improve after trying this first. 2) If the pt is convinced, as many are, that saba helps recover from activity faster then it's easy to tell if this is the case by re-challenging : ie stop, take the inhaler, then p 5 minutes try the exact same activity (intensity of workload) that just caused the symptoms and see if they are substantially diminished or not after saba 3) if there is an activity that reproducibly causes the symptoms, try the saba 15 min before the activity on alternate days   If in  fact the saba really does help, then fine to continue to use it prn but advised may need to look closer at the maintenance regimen being used to achieve better control of airways disease with exertion.

## 2024-01-27 NOTE — Patient Instructions (Signed)
 Plan A = Automatic = Always=    Stop trelegy and start Breztri Take 2 puffs first thing in am and then another 2 puffs about 12 hours later.    Work on inhaler technique:  relax and gently blow all the way out then take a nice smooth full deep breath back in, triggering the inhaler at same time you start breathing in.  Hold breath in for at least  5 seconds if you can. Blow out breztri  thru nose. Rinse and gargle with water when done.  If mouth or throat bother you at all,  try brushing teeth/gums/tongue with arm and hammer toothpaste/ make a slurry and gargle and spit out.     Plan B = Backup (to supplement plan A, not to replace it) Only use your albuterol inhaler as a rescue medication to be used if you can't catch your breath by resting or doing a relaxed purse lip breathing pattern.  - The less you use it, the better it will work when you need it. - Ok to use the inhaler up to 2 puffs  every 4 hours if you must but call for appointment if use goes up over your usual need - Don't leave home without it !!  (think of it like the spare tire for your car)    Please schedule a follow up office visit in 6 weeks, call sooner if needed

## 2024-03-07 NOTE — Progress Notes (Unsigned)
 Jeremy [name], male    DOB: 1951/06/02   MRN: [id number]   Brief patient profile:  44 yowm  MM/quit smoking cigarettes 06/2021 and stopped pipe 11/2021 Jeremy [name] vaping   referred to pulmonary clinic in [name]  08/20/2021 by Dr  Rollen Sox)   s/p admit for CAP with residual dz ? All post inflammatory acute lung injury   Admit date: 06/14/2021  Discharge date: 06/16/21     Brief/Interim Summary: Hx significant of COPD, essential hypertension, tobacco use disorder, alcohol use disorder, GERD, history of lumbar surgery, history of cervical spinal surgery, primary osteoarthritis of hip s/p left total hip arthroplasty presents with shortness of breath with associated fever, productive sputum of 2 day duration found to have interstitial pneumonia on CXR. Given patient's older age, history of COPD, hypoxia, fever, and hyponatremia, PSI/PORT score (107) indicated moderate mortality risk patient was hospitalized for inpatient treatment of pneumonia. Patient was de satting at 87% on room air and improved to 94% on 2L [place]. Patient received prednisone 40mg , antibiotics Rocephin and azithromycin IV. Blood culture was obtained showing...    Overnight, patient improved significantly with absence of fever for >15 hours. Patient tried exertion with and without oxygen to determine oxygen demand... Tobacco cessation counseling was provided at bedside.      Discharge Diagnoses:    Acute hypoxemic respiratory failure (HCC)   Community acquired pneumonia   Acute on chronic respiratory failure secondary to COPD exacerbation and community acquired pneumonia    History of Present Illness  08/20/2021  Pulmonary/ 1st office eval/ Jeremy [name] / [facility] on ACEi  Chief Complaint  Patient presents with   Consult    Pneumonia in the [name] 2X. Er doctor noted some places on the lung after Chest CT. PCP noted a spot on upper right lung. Here for recommendation from [name] visit. Smokes a tobacco pipe 3X day   Dyspnea:  limited R hip / can do room to room at home maybe 50 ft Cough: some cough/ congestion but not productive never bloody Sleep: 10 degrees with bricks / bed one pillow, has cpap but not using  SABA use: 3-4 x per day despite trelegy  Overt HB despite bed blocks and ppi bid  Rec Plan A = Automatic = Always=    Trelegy one click daily  Plan B = Backup (to supplement plan A, not to replace it) Only use your albuterol inhaler as a rescue medication Ok to try albuterol 15 min before an activity (on alternating days)  that you know would usually make you short of breath  Stop lisinopril and substitute olmesartan 40 mg one daily and many of your symptoms should improve. PFTs should be done next available and I will call you with results > not done Suggested e-cigs as an optional  "one way bridge"  Off all tobacco products     11/02/2021  f/u ov/[place] office/Jeremy [name] re: RUL pna/ copd/ ? Acei case  maint on trelegy 200 and taking it at noon   Chief Complaint  Patient presents with   Follow-up    F/u visit for surgery discussion. Feels he has improved a little since last OV.   Dyspnea:  100 ft = MMRC3 = can't walk 100 yards even at a slow pace at a flat grade s stopping due to sob   Cough: nasal congestion / using neosynephrine x 2 weeks had been using pseudofed previously  Sleeping: bed blocks / pillow x 1  SABA use: q am (not the  instructions given)    02: none  Covid status: vax x 5   Rec Plan A = Automatic = Always=    Trelegy 100 one click first thing in am and blow out through nose  Plan B = Backup (to supplement plan A, not to replace it) Only use your albuterol inhaler as a rescue medication  Flonase  has no immediate benefit in terms of improving symptoms.     R THR  02/26/22   07/19/2022  f/u ov/[place] office/Jeremy [name] re: GOLD 3  maint on Trelegy   Chief Complaint  Patient presents with   Follow-up    Pt states he had hip surgery on 02/26/2022. Breathing is doing ok with  some SOB.   Dyspnea:  10 min walks slowed by hips > sob  Cough: minimal in am mucoid Sleeping: flat bed on side one pillow  SABA use: once or twice a day 02: none Covid status: vax all / never infected Lung cancer screening: per Dr Kirtland Bouchard per pt  Rec Please remember to go to the lab department   for your tests - we will call you with the results when they are available. Depomedrol 120 mg IM today  Ok to try benadryl as needed for itching  Wean off all vaping as soon as possible (one way bridge off all nicotine products)      Admit date:     11/08/2022  Discharge date: 11/11/22   Discharge Diagnoses: Principal Problem:   COPD exacerbation (HCC)    Essential hypertension   Acute hypoxemic respiratory failure (HCC)   Hyponatremia   GERD   Alcohol abuse.     [name] Course: As per H&P written by Dr. Randol Kern on 11/08/2022 Jeremy [name]  is a 73 y.o. male, with past medical history of COPD, hypertension, alcohol abuse, he presents to ED secondary to dyspnea, reports over the last 5 to 7 days he started to have some dyspnea, cough, sore throat, was prescribed azithromycin, guaifenesin/codeine, without much improvement of his symptoms, reports he was taking Tylenol, unsure if he had any fever, reports poor appetite, significant wheezing, cough, productive sputum, blood-streaked sometimes, reported drinks 4-5 shots of whiskey every afternoon, reports he recently stopped smoking but started to vape instead. -In ED patient was hypoxic 85% on room air, CTA chest with no evidence of PE, or pneumonia, he had significant wheezing improved with steroids, but remains hypoxic on 2 L nasal cannula    Assessment and Plan: Acute hypoxic respiratory failure COPD exacerbation -Patient presents with dyspnea, significant wheezing, cough with productive phlegm -85-86% on room air initially, on 2 L nasal cannula, he remains with oxygen requirement at time of discharge, especially with activity. -Will  continue treatment with antibiotics, steroids tapering, mucolytic's/antitussive regimen, the use of flutter valve and bronchodilator management. -Outpatient follow-up with pulmonology service recommended. -CT demonstrating no pulmonary embolism. -Stable and improved at time of discharge; patient has been advised to stop vaping.   Hyponatremia -Due to volume depletion, and the use of chronic alcohol use. -Patient vies to maintain adequate hydration -Repeat basic metabolic panel follow-up visit to assess electrolytes stability. -Continue just as needed diuretic for edema and swelling; daily HCTZ has been discontinued.   Alcohol abuse -CIWA protocol use while hospitalized; no withdrawal symptoms appreciated -Cessation counseling provided -Continue folic acid and thiamine.   History of tobacco use  -Patient reports he is smoking no more, but currently vaping.   -Cessation counseling provided.   GERD -continue with PPI- -lifestyle changes  discussed with patient.   Hypertension -Stable and improved at discharge -Resume home antihypertensive regimen -Patient advised to follow heart healthy diet.       Consultants:  Procedures performed: See below for x-ray report Disposition: Home Diet recommendation: Heart healthy diet   DISCHARGE MEDICATION: Allergies as of 11/11/2022      01/27/2023  f/u ov/[place] office/Jeremy [name] re: GOLD 3 copd maint on telegy   Chief Complaint  Patient presents with   Follow-up    Would like 90 days supply on olmesartan  Had hosp admission 12/9-12/11 COPD was sent home wit O2 but doing much better and does not feel like he needs it.   Dyspnea:  3 min walk outside due to R hip  Cough: gone  Sleeping: bed blocks / one pillow do resp cc  SABA NGE:XBMWU before ex twice daily , has not tried s pre rx 02: off of 02 for several  Covid status: x 5  Lung cancer screening: referred today  Rec Cancel the Ct for May and we will refer you the lung cancer  screening program and no further follow up with Dr Kirtland Bouchard needed  Also  Jeremy [name] to try albuterol 15 min before an activity (on alternating days)  that you know would usually make you short of breath and see if it makes any difference and if makes none then don't take albuterol after activity unless you can't catch your breath as this means it's the resting that helps, not the albuterol.  We will walk you today to see if your oxygen level is adequate   Please schedule a follow up visit in 12 months but call sooner if needed     01/27/2024 12 m f/u ov/[place] office/Jeremy [name] re: GOLD 3 copd  maint on trelegy 100   Chief Complaint  Patient presents with   Follow-up  Dyspnea:   hips /back slowing him down  Cough: just in am's clears after  Sleeping: bed blocks    resp cc  SABA use: twice daily  02: prn but not using - sats usually 89% or greater on his self-monitor Lung cancer screening: each May  Rec     03/09/2024  f/u ov/ office/[facility] re: *** maint on ***  No chief complaint on file.   Dyspnea:  *** Cough: *** Sleeping: ***   resp cc  SABA use: *** 02: ***  Lung cancer screening: ***   No obvious day to day or daytime variability or assoc excess/ purulent sputum or mucus plugs or hemoptysis or cp or chest tightness, subjective wheeze or overt sinus or hb symptoms.    Also denies any obvious fluctuation of symptoms with weather or environmental changes or other aggravating or alleviating factors except as outlined above   No unusual exposure hx or h/o childhood pna/ asthma or knowledge of premature birth.  Current Allergies, Complete Past Medical History, Past Surgical History, Family History, and Social History were reviewed in [employer] record.  ROS  The following are not active complaints unless bolded Hoarseness, sore throat, dysphagia, dental problems, itching, sneezing,  nasal congestion or discharge of excess mucus or purulent secretions, ear  ache,   fever, chills, sweats, unintended wt loss or wt gain, classically pleuritic or exertional cp,  orthopnea pnd or arm/hand swelling  or leg swelling, presyncope, palpitations, abdominal pain, anorexia, nausea, vomiting, diarrhea  or change in bowel habits or change in bladder habits, change in stools or change in urine, dysuria, hematuria,  rash, arthralgias, visual  complaints, headache, numbness, weakness or ataxia or problems with walking or coordination,  change in mood or  memory.        No outpatient medications have been marked as taking for the 03/09/24 encounter (Appointment) with Jeremy Cowden, MD.               Past Medical History:  Diagnosis Date   Alcohol use    daily   Anxiety    hx (05/05/2018)   Arthritis    "all over" (05/05/2018)   Asthma    Chronic bronchitis (HCC)    Dyspnea    Family history of adverse reaction to anesthesia    "mom would have nausea after OR"    GERD (gastroesophageal reflux disease)    History of hiatal hernia    Hypertension    Pneumonia X 1   PONV (postoperative nausea and vomiting)    Seasonal allergies    "spring, summer, fall"        Objective:    Wts  03/09/2024          ***  01/27/2024       198  01/27/2023       189  07/19/2022       194   11/02/21 196 lb 1.9 oz (89 kg)  10/29/21 192 lb 14.4 oz (87.5 kg)  08/20/21 192 lb (87.1 kg)    Vital signs reviewed  03/09/2024  - Note at rest 02 sats  ***% on ***   General appearance:    ***      Mild barr   very awkward slow gait  ***   Assessment

## 2024-03-09 ENCOUNTER — Ambulatory Visit: Payer: Medicare HMO | Admitting: Internal Medicine

## 2024-03-09 ENCOUNTER — Encounter: Payer: Self-pay | Admitting: Internal Medicine

## 2024-03-09 VITALS — BP 141/69 | HR 82 | Ht 69.0 in | Wt 198.0 lb

## 2024-03-09 DIAGNOSIS — J449 Chronic obstructive pulmonary disease, unspecified: Secondary | ICD-10-CM | POA: Diagnosis not present

## 2024-03-09 NOTE — Assessment & Plan Note (Addendum)
 Quit smoking cigs 06/2021/MM - 08/20/2021  After extensive coaching inhaler device,  effectiveness =  hfa   75% (short ti) > continue dpi trelegy with approp saba prn  - Spirometry 12/24/2021  FEV1 1.40 (43%)  Ratio 0.57 p trelegy prior with classic f/v loop   - Labs ordered 07/19/2022  :  allergy screen IgE 241  0.2    alpha one AT phenotype  MM  Level 149  - 07/19/2022   Walked on RA  x  3  lap(s) =  approx 450  ft  @ fast pace, stopped due to end of study s sob with lowest 02 sats 93%   - 01/27/2023   Walked on RA  x  3  lap(s) =  approx 450  ft  @ mod fast pace with cane stopped due to end of study  with lowest 02 sats 92% s sob  > d/c all 02 > did not do  -Chest LDSCT    04/29/23 Severe centrilobular and paraseptal emphysematous changes, upper lung predominant - 01/27/2024  After extensive coaching inhaler device,  effectiveness =    70% (short Ti) try change to breztri due to cough/hoarseness  on trelegy  - 01/27/2024   Walked on RA  x  1  lap(s) =  approx 150  ft  @ slow/awkward pace, stopped due to back/hip pain with lowest 02 sats 91%  - 03/09/2024  After extensive coaching inhaler device,  effectiveness =    75% hfa (short ti)    Group D (now reclassified as E) in terms of symptom/risk and laba/lama/ICS  therefore appropriate rx at this point >>>  breztri plus needs more approp saba use   Re SABA :  I spent extra time with pt today reviewing appropriate use of albuterol for prn use on exertion with the following points: 1) saba is for relief of sob that does not improve by walking a slower pace or resting but rather if the pt does not improve after trying this first. 2) If the pt is convinced, as many are, that saba helps recover from activity faster then it's easy to tell if this is the case by re-challenging : ie stop, take the inhaler, then p 5 minutes try the exact same activity (intensity of workload) that just caused the symptoms and see if they are substantially diminished or not after saba 3)  if there is an activity that reproducibly causes the symptoms, try the saba 15 min before the activity on alternate days   If in fact the saba really does help, then fine to continue to use it prn but advised may need to look closer at the maintenance regimen being used to achieve better control of airways disease with exertion.   Also reminded to check 02 sats at peak ex to be sure stays over 90% and if not consider supplemental 02 rx to reach this goal         Each maintenance medication was reviewed in detail including emphasizing most importantly the difference between maintenance and prns and under what circumstances the prns are to be triggered using an action plan format where appropriate.  Total time for H and P, chart review, counseling, reviewing hfa/ 02 pulse ox  device(s) and generating customized AVS unique to this office visit / same day charting = 32 min

## 2024-03-09 NOTE — Patient Instructions (Addendum)
Work on inhaler technique:  relax and gently blow all the way out then take a nice smooth full deep breath back in, triggering the inhaler at same time you start breathing in.  Hold breath in for at least  5 seconds if you can. Blow out breztri  thru nose. Rinse and gargle with water when done.  If mouth or throat bother you at all,  try brushing teeth/gums/tongue with arm and hammer toothpaste/ make a slurry and gargle and spit out.    Make sure you check your oxygen saturation  AT  your highest level of activity (not after you stop)   to be sure it stays over 90% and adjust  02 flow upward to maintain this level if needed but remember to turn it back to previous settings when you stop (to conserve your supply).    Please schedule a follow up visit in 6  months but call sooner if needed

## 2024-04-29 ENCOUNTER — Ambulatory Visit (HOSPITAL_COMMUNITY)
Admission: RE | Admit: 2024-04-29 | Discharge: 2024-04-29 | Disposition: A | Discharge status: Home / Self Care | Payer: Medicare HMO | Source: Ambulatory Visit | Attending: Physician Assistant | Admitting: Physician Assistant

## 2024-04-29 DIAGNOSIS — Z87891 Personal history of nicotine dependence: Secondary | ICD-10-CM | POA: Diagnosis present

## 2024-04-29 DIAGNOSIS — R911 Solitary pulmonary nodule: Secondary | ICD-10-CM | POA: Insufficient documentation

## 2024-07-20 ENCOUNTER — Ambulatory Visit: Admitting: Physician Assistant

## 2024-07-26 ENCOUNTER — Ambulatory Visit: Admitting: Physician Assistant

## 2024-07-26 ENCOUNTER — Encounter: Payer: Self-pay | Admitting: Physician Assistant

## 2024-07-26 VITALS — BP 137/77 | HR 77 | Temp 98.2°F | Ht 66.0 in | Wt 198.0 lb

## 2024-07-26 DIAGNOSIS — K449 Diaphragmatic hernia without obstruction or gangrene: Secondary | ICD-10-CM

## 2024-07-26 DIAGNOSIS — Z7689 Persons encountering health services in other specified circumstances: Secondary | ICD-10-CM

## 2024-07-26 DIAGNOSIS — F1090 Alcohol use, unspecified, uncomplicated: Secondary | ICD-10-CM

## 2024-07-26 DIAGNOSIS — I10 Essential (primary) hypertension: Secondary | ICD-10-CM | POA: Diagnosis not present

## 2024-07-26 DIAGNOSIS — J449 Chronic obstructive pulmonary disease, unspecified: Secondary | ICD-10-CM

## 2024-07-26 DIAGNOSIS — K219 Gastro-esophageal reflux disease without esophagitis: Secondary | ICD-10-CM | POA: Insufficient documentation

## 2024-07-26 NOTE — Progress Notes (Signed)
 New Patient Office Visit  Subjective    Patient ID: Jeremy [name], male    DOB: 1951/08/25  Age: 73 y.o. MRN: [id number]  CC:  Chief Complaint  Patient presents with   New Patient (Initial Visit)    HPI Jeremy [name] presents to establish care  Discussed the use of AI scribe software for clinical note transcription with the patient, who gave verbal consent to proceed.  History of Present Illness Jeremy [name] is a 73 year old male with COPD, hypertension, GERD/hiatal hernia, and osteoarthritis who presents to establish care.  He has severe arthritis in his hands, affecting his ability to write. He experiences instability on his feet and uses a cane for support. He underwent hip surgery in 2003 and has leg problems attributed to his back. Family history includes spinal problems in his brothers.  He manages COPD with inhalers and has a portable oxygen  machine for use when walking. He experiences morning congestion and mucus production. He was hospitalized twice for respiratory issues and now undergoes annual CT scans for lung nodules, with no concerning changes noted. He quit smoking two years ago and uses nicotine gum.patient follows with pulmonology (Dr. Arla) regularly, with upcoming appointment in October.   Hypertension is well-controlled with three medications. He had a rash from a cholesterol medication, which was discontinued. Cholesterol levels were normal at his last physical with previous PCP last month, awaiting medical records.  He consumes four shots of whiskey daily, however his wife reports he consumes at least 1 half gallon of whiskey weekly. He has a hiatal hernia and takes medication for acid reflux without significant swallowing issues. He takes Xanax  at night for sleep, using one pill despite a prescription for three per day. His medical history includes sinus surgery and gallbladder removal.  He does not have additional concerns or complaints today, aside  from establishing care.     Outpatient Encounter Medications as of 07/26/2024  Medication Sig   ALPRAZolam  (XANAX ) 0.5 MG tablet Take 0.25-0.5 mg by mouth daily.   amLODipine  (NORVASC ) 10 MG tablet Take 10 mg by mouth daily.   aspirin  EC 81 MG tablet Take 1 tablet (81 mg total) by mouth 2 (two) times daily. For DVT prophylaxis for 30 days after surgery. (Patient taking differently: Take 81 mg by mouth once.)   Budeson-Glycopyrrol-Formoterol (BREZTRI  AEROSPHERE) 160-9-4.8 MCG/ACT AERO Inhale 2 puffs into the lungs 2 (two) times daily.   Cholecalciferol (VITAMIN D) 50 MCG (2000 UT) CAPS Take 4,000 Units by mouth daily.   cyanocobalamin (,VITAMIN B-12,) 1000 MCG/ML injection Inject 1,000 mcg into the muscle every 30 (thirty) days.   diltiazem  (CARDIZEM  CD) 240 MG 24 hr capsule Take 240 mg by mouth daily.   furosemide (LASIX) 20 MG tablet Take 20 mg by mouth daily as needed.   Glucosamine HCl (GLUCOSAMINE PO) Take 2 tablets by mouth daily.   levalbuterol (XOPENEX HFA) 45 MCG/ACT inhaler Inhale 2 puffs into the lungs every 4 (four) hours.   loperamide  (IMODIUM ) 2 MG capsule Take 1 capsule (2 mg total) by mouth 4 (four) times daily as needed for diarrhea or loose stools.   meloxicam (MOBIC) 7.5 MG tablet Take 7.5 mg by mouth daily.   olmesartan  (BENICAR ) 40 MG tablet TAKE ONE TABLET BY MOUTH ONCE DAILY.   pantoprazole  (PROTONIX ) 40 MG tablet Take 40 mg by mouth daily.   potassium chloride  SA (KLOR-CON  M) 20 MEQ tablet Take 20 mEq by mouth as needed.   TURMERIC PO  Take 1 capsule by mouth daily.   vitamin E 180 MG (400 UNITS) capsule Take 400 Units by mouth daily.   [DISCONTINUED] acetaminophen  (TYLENOL ) 500 MG tablet Take 2 tablets (1,000 mg total) by mouth every 6 (six) hours as needed for moderate pain or mild pain. (Patient taking differently: Take 1,000 mg by mouth as needed for moderate pain (pain score 4-6) or mild pain (pain score 1-3).)   [DISCONTINUED] albuterol  (VENTOLIN  HFA) 108 (90 Base)  MCG/ACT inhaler Inhale 2 puffs into the lungs every 4 (four) hours as needed.   [DISCONTINUED] dextromethorphan -guaiFENesin  (MUCINEX  DM) 30-600 MG 12hr tablet Take 1 tablet by mouth 2 (two) times daily.   [DISCONTINUED] folic acid  (FOLVITE ) 1 MG tablet Take 1 tablet (1 mg total) by mouth daily.   [DISCONTINUED] thiamine  (VITAMIN B-1) 100 MG tablet Take 1 tablet (100 mg total) by mouth daily.   No facility-administered encounter medications on file as of 07/26/2024.    Past Medical History:  Diagnosis Date   Alcohol use    daily   Anxiety    hx (05/05/2018)   Arthritis    all over (05/05/2018)   Asthma    Chronic bronchitis (HCC)    COPD (chronic obstructive pulmonary disease) (HCC)    Dyspnea    Family history of adverse reaction to anesthesia    mom would have nausea after OR    GERD (gastroesophageal reflux disease)    History of hiatal hernia    Hypertension    Pneumonia X 1   PONV (postoperative nausea and vomiting)    Seasonal allergies    spring, summer, fall   Sleep apnea     Past Surgical History:  Procedure Laterality Date   ANTERIOR CERVICAL DECOMP/DISCECTOMY FUSION Left    BACK SURGERY     COLONOSCOPY     JOINT REPLACEMENT     LAPAROSCOPIC CHOLECYSTECTOMY     LUMBAR DISC SURGERY  X 2   L4-5   NASAL POLYP EXCISION  ~ 1970   TONSILLECTOMY     TOTAL HIP ARTHROPLASTY Left 05/05/2018   TOTAL HIP ARTHROPLASTY Left 05/05/2018   Procedure: LEFT TOTAL HIP ARTHROPLASTY ANTERIOR APPROACH;  Surgeon: Beverley Evalene BIRCH, MD;  Location: [facility] OR;  Service: Orthopedics;  Laterality: Left;   TOTAL HIP ARTHROPLASTY Right 02/26/2022   Procedure: TOTAL HIP ARTHROPLASTY ANTERIOR APPROACH;  Surgeon: Beverley Evalene BIRCH, MD;  Location: [facility];  Service: Orthopedics;  Laterality: Right;    Family History  Problem Relation Age of Onset   Heart disease Mother    Heart disease Father     Social History   Socioeconomic History   Marital status: Married    Spouse name: Not on file    Number of children: 2   Years of education: Not on file   Highest education level: Some college, no degree  Occupational History   Occupation: Retired   Occupation: retired  Tobacco Use   Smoking status: Former    Current packs/day: 0.00    Average packs/day: 1 pack/day for 60.0 years (60.0 ttl pk-yrs)    Types: Cigarettes, Pipe    Start date: 06/13/1961    Quit date: 06/13/2021    Years since quitting: 3.1   Smokeless tobacco: Never  Vaping Use   Vaping status: Every Day  Substance and Sexual Activity   Alcohol use: Yes    Alcohol/week: 3.0 standard drinks of alcohol    Types: 3 Shots of liquor per week    Comment: 3 drinks per  day   Drug use: Never   Sexual activity: Not Currently  Other Topics Concern   Not on file  Social History Narrative   Not on file   Social Drivers of Health   Financial Resource Strain: Low Risk  (07/21/2024)   Overall Financial Resource Strain (CARDIA)    Difficulty of Paying Living Expenses: Not hard at all  Food Insecurity: No Food Insecurity (07/21/2024)   Hunger Vital Sign    Worried About Running Out of Food in the Last Year: Never true    Ran Out of Food in the Last Year: Never true  Transportation Needs: No Transportation Needs (07/21/2024)   PRAPARE - Administrator, Civil Service (Medical): No    Lack of Transportation (Non-Medical): No  Physical Activity: Inactive (07/21/2024)   Exercise Vital Sign    Days of Exercise per Week: 0 days    Minutes of Exercise per Session: Not on file  Stress: No Stress Concern Present (07/21/2024)   [employer] of Occupational Health - Occupational Stress Questionnaire    Feeling of Stress: Not at all  Social Connections: Socially Integrated (07/21/2024)   Social Connection and Isolation Panel    Frequency of Communication with Friends and Family: More than three times a week    Frequency of Social Gatherings with Friends and Family: Once a week    Attends Religious Services: 1 to 4  times per year    Active Member of [employer] or Organizations: Yes    Attends Engineer, structural: More than 4 times per year    Marital Status: Married  Catering manager Violence: Not At Risk (11/09/2022)   Humiliation, Afraid, Rape, and Kick questionnaire    Fear of Current or Ex-Partner: No    Emotionally Abused: No    Physically Abused: No    Sexually Abused: No    Review of Systems  Constitutional:  Negative for fever, malaise/fatigue and weight loss.  Respiratory:  Positive for cough and sputum production. Negative for shortness of breath and wheezing.   Cardiovascular:  Negative for chest pain, palpitations and leg swelling.  Musculoskeletal:  Positive for back pain and joint pain. Negative for falls.  Neurological:  Negative for dizziness and headaches.        Objective    BP 137/77   Pulse 77   Temp 98.2 F (36.8 C)   Ht 5' 6 (1.676 m)   Wt 198 lb (89.8 kg)   SpO2 95%   BMI 31.96 kg/m   Physical Exam Constitutional:      Appearance: Normal appearance. He is obese.  HENT:     Head: Normocephalic.     Mouth/Throat:     Mouth: Mucous membranes are moist.     Pharynx: Oropharynx is clear.  Eyes:     Extraocular Movements: Extraocular movements intact.     Conjunctiva/sclera: Conjunctivae normal.  Cardiovascular:     Rate and Rhythm: Normal rate and regular rhythm.     Heart sounds: Normal heart sounds. No murmur heard. Pulmonary:     Effort: Pulmonary effort is normal.     Breath sounds: Normal breath sounds. No wheezing, rhonchi or rales.  Musculoskeletal:     Right lower leg: No edema.     Left lower leg: No edema.  Skin:    General: Skin is warm and dry.  Neurological:     General: No focal deficit present.     Mental Status: He is alert and oriented to person,  place, and time.  Psychiatric:        Mood and Affect: Mood normal.        Behavior: Behavior normal.       Assessment & Plan:  Encounter to establish care  Essential  hypertension Assessment & Plan: 137/77. Controlled. Continue current medications. No change in management. Discussed DASH diet and dietary sodium restrictions.  Increase dietary efforts and physical activity as tolerated.    COPD GOLD 3 Assessment & Plan: COPD well-managed with inhalers. Congestion and morning cough resolve after a few hours. No new symptoms or concerns identified at this visit. Continues pulmonology follow-up. - Continue current inhaler regimen. - Use portable oxygen  as needed when walking. - Consider RSV and flu vaccinations.   Alcohol use Assessment & Plan: Consumes four shots of whiskey daily. Advised to reduce intake and discussed risks, especially with Xanax . - Reduce alcohol intake to two drinks or less per day. - Be cautious with alcohol use in combination with Xanax .    Hiatal hernia with gastroesophageal reflux Assessment & Plan: Condition well-managed with medication. - Continue pantoprazole  as previously prescribed.    Return in about 6 months (around 01/26/2025).   Charmaine Makylah Bossard, PA-C

## 2024-07-26 NOTE — Assessment & Plan Note (Signed)
 Condition well-managed with medication. - Continue pantoprazole  as previously prescribed.

## 2024-07-26 NOTE — Assessment & Plan Note (Signed)
 Consumes four shots of whiskey daily. Advised to reduce intake and discussed risks, especially with Xanax . - Reduce alcohol intake to two drinks or less per day. - Be cautious with alcohol use in combination with Xanax .

## 2024-07-26 NOTE — Assessment & Plan Note (Signed)
 137/77. Controlled. Continue current medications. No change in management. Discussed DASH diet and dietary sodium restrictions.  Increase dietary efforts and physical activity as tolerated.

## 2024-07-26 NOTE — Assessment & Plan Note (Signed)
 COPD well-managed with inhalers. Congestion and morning cough resolve after a few hours. No new symptoms or concerns identified at this visit. Continues pulmonology follow-up. - Continue current inhaler regimen. - Use portable oxygen  as needed when walking. - Consider RSV and flu vaccinations.

## 2024-09-13 ENCOUNTER — Telehealth: Payer: Self-pay

## 2024-09-13 ENCOUNTER — Encounter: Payer: Self-pay | Admitting: Internal Medicine

## 2024-09-13 ENCOUNTER — Ambulatory Visit: Admitting: Internal Medicine

## 2024-09-13 VITALS — BP 154/69 | HR 85 | Ht 66.0 in | Wt 197.2 lb

## 2024-09-13 DIAGNOSIS — J449 Chronic obstructive pulmonary disease, unspecified: Secondary | ICD-10-CM | POA: Diagnosis not present

## 2024-09-13 NOTE — Patient Instructions (Addendum)
 No mint menthol  or chocolate products   Work on inhaler technique:  relax and gently blow all the way out then take a nice smooth full deep breath back in, triggering the inhaler at same time you start breathing in.  Hold breath in for at least  5 seconds if you can. Blow out breztri  thru nose. Rinse and gargle with water  when done.  If mouth or throat bother you at all,  try brushing teeth/gums/tongue with arm and hammer toothpaste/ make a slurry and gargle and spit out.   Please schedule a follow up visit in 12  months but call sooner if needed

## 2024-09-13 NOTE — Progress Notes (Signed)
 Jeremy [name], male    DOB: 26-Nov-1951   MRN: [id number]   Brief patient profile:  34 yowm  MM/quit smoking cigarettes 06/2021 and stopped pipe 11/2021 Jeremy [name] vaping   referred to pulmonary clinic in [place]  08/20/2021 by Dr  Maree ([facility])   s/p admit for CAP with residual dz ? All post inflammatory acute lung injury   Admit date: 06/14/2021 Discharge date: 06/16/21     Brief/Interim Summary: Hx significant of COPD, essential hypertension, tobacco use disorder, alcohol use disorder, GERD, history of lumbar surgery, history of cervical spinal surgery, primary osteoarthritis of hip s/p left total hip arthroplasty presents with shortness of breath with associated fever, productive sputum of 2 day duration found to have interstitial pneumonia on CXR. Given patient's older age, history of COPD, hypoxia, fever, and hyponatremia, PSI/PORT score (107) indicated moderate mortality risk patient was hospitalized for inpatient treatment of pneumonia. Patient was de satting at 87% on room air and improved to 94% on 2L [place]. Patient received prednisone  40mg , antibiotics Rocephin  and azithromycin  IV. Blood culture was obtained showing...    Overnight, patient improved significantly with absence of fever for >15 hours. Patient tried exertion with and without oxygen  to determine oxygen  demand... Tobacco cessation counseling was provided at bedside.      Discharge Diagnoses:    Acute hypoxemic respiratory failure (HCC)   Community acquired pneumonia   Acute on chronic respiratory failure secondary to COPD exacerbation and community acquired pneumonia    History of Present Illness  08/20/2021  Pulmonary/ 1st office eval/ Jeremy [name] / [place] [facility] on ACEi  Chief Complaint  Patient presents with   Consult    Pneumonia in the hospital 2X. Er doctor noted some places on the lung after Chest CT. PCP noted a spot on upper right lung. Here for recommendation from hospital visit. Smokes a tobacco pipe 3X day   Dyspnea:  limited R hip / can do room to room at home maybe 50 ft Cough: some cough/ congestion but not productive never bloody Sleep: 10 degrees with bricks / bed one pillow, has cpap but not using  SABA use: 3-4 x per day despite trelegy  Overt HB despite bed blocks and ppi bid  Rec Plan A = Automatic = Always=    Trelegy one click daily  Plan B = Backup (to supplement plan A, not to replace it) Only use your albuterol  inhaler as a rescue medication Ok to try albuterol  15 min before an activity (on alternating days)  that you know would usually make you short of breath  Stop lisinopril  and substitute olmesartan  40 mg one daily and many of your symptoms should improve. PFTs should be done next available and I will call you with results > not done Suggested e-cigs as an optional  one way bridge  Off all tobacco products     11/02/2021  f/u ov/[place] office/Jeremy [name] re: RUL pna/ copd/ ? Acei case  maint on trelegy 200 and taking it at noon   Chief Complaint  Patient presents with   Follow-up    F/u visit for surgery discussion. Feels he has improved a little since last OV.   Dyspnea:  100 ft = MMRC3 = can't walk 100 yards even at a slow pace at a flat grade s stopping due to sob   Cough: nasal congestion / using neosynephrine x 2 weeks had been using pseudofed previously  Sleeping: bed blocks / pillow x 1  SABA use: q am (not the  instructions given)    02: none  Covid status: vax x 5   Rec Plan A = Automatic = Always=    Trelegy 100 one click first thing in am and blow out through nose  Plan B = Backup (to supplement plan A, not to replace it) Only use your albuterol  inhaler as a rescue medication  Flonase   has no immediate benefit in terms of improving symptoms.     R THR  02/26/22     01/27/2024 12 m f/u ov/[place] office/Jeremy [name] re: GOLD 3 copd  maint on trelegy 100   Chief Complaint  Patient presents with   Follow-up  Dyspnea:  hips /back slowing him down  Cough: just in  am's clears after  Sleeping: bed blocks    resp cc  SABA use: twice daily  02: prn but not using - sats usually 89% or greater on his self-monitor Lung cancer screening: each May  Rec Plan A = Automatic = Always=    Stop trelegy and start Breztri  Take 2 puffs first thing in am and then another 2 puffs about 12 hours later.  Work on inhaler technique: Plan B = Backup (to supplement plan A, not to replace it) Only use your albuterol  inhaler as a rescue medication   Please schedule a follow up office visit in 6 weeks, call sooner if needed     03/09/2024  f/u ov/[place] office/Jeremy [name] re: GOLD 3 copd  maint on breztri   Chief Complaint  Patient presents with   Shortness of Breath  Dyspnea:  not really lmited by breathing/ going room to room now limited by hip vs prehip problems baseline able to do mailbox and back then  Cough: minimal - thinks  pollen exp  triggers  Sleeping: bed blocks s   resp cc  SABA use: twice a day neb  02: has concentrator not using and not monitoring  Lung cancer screening:in program Rec Work on inhaler technique: SABRA  Make sure you check your oxygen  saturation  AT  your highest level of activity (not after you stop)   to be sure it stays over 90%  04/29/24 1. Lung-RADS 2, benign appearance or behavior. Continue annual screening with low-dose chest CT without contrast in 12 months. 2. Three-vessel coronary atherosclerosis. 3. Moderate emphysema    09/13/2024  6 m f/u ov/[place] office/Jeremy [name] re: GOLD 3 copd  maint on breztri    Chief Complaint  Patient presents with   COPD    [place] f/u    Dyspnea:  more limited by back than breathing  Cough: am mucus x 2-3 hours uses flutter seems to help  Sleeping: bed blocks/ one pillow s    resp cc  SABA use: once or twice daily  02: uses portable device and concentrator  - sats no lower than 90% on RA   No obvious day to day or daytime variability or assoc excess/ purulent sputum or mucus plugs or hemoptysis or cp or  chest tightness, subjective wheeze or overt   hb symptoms.    Also denies any obvious fluctuation of symptoms with weather or environmental changes or other aggravating or alleviating factors except as outlined above   No unusual exposure hx or h/o childhood pna/ asthma or knowledge of premature birth.  Current Allergies, Complete Past Medical History, Past Surgical History, Family History, and Social History were reviewed in [employer] record.  ROS  The following are not active complaints unless bolded Hoarseness, sore throat, dysphagia, dental problems,  itching, sneezing,  nasal congestion or discharge of excess mucus or purulent secretions, ear ache,   fever, chills, sweats, unintended wt loss or wt gain, classically pleuritic or exertional cp,  orthopnea pnd or arm/hand swelling  or leg swelling, presyncope, palpitations, abdominal pain, anorexia, nausea, vomiting, diarrhea  or change in bowel habits or change in bladder habits, change in stools or change in urine, dysuria, hematuria,  rash, arthralgias, visual complaints, headache, numbness, weakness or ataxia or problems with walking or coordination,  change in mood or  memory.        Current Meds  Medication Sig   albuterol  (VENTOLIN  HFA) 108 (90 Base) MCG/ACT inhaler Inhale into the lungs every 6 (six) hours as needed for wheezing or shortness of breath.   Budeson-Glycopyrrol-Formoterol (BREZTRI  AEROSPHERE) 160-9-4.8 MCG/ACT AERO Inhale 2 puffs into the lungs 2 (two) times daily.               Past Medical History:  Diagnosis Date   Alcohol use    daily   Anxiety    hx (05/05/2018)   Arthritis    all over (05/05/2018)   Asthma    Chronic bronchitis (HCC)    Dyspnea    Family history of adverse reaction to anesthesia    mom would have nausea after OR    GERD (gastroesophageal reflux disease)    History of hiatal hernia    Hypertension    Pneumonia X 1   PONV (postoperative nausea and vomiting)     Seasonal allergies    spring, summer, fall        Objective:    Wts  09/13/2024      197  03/09/2024         198  01/27/2024       198  01/27/2023       189  07/19/2022       194   11/02/21 196 lb 1.9 oz (89 kg)  10/29/21 192 lb 14.4 oz (87.5 kg)  08/20/21 192 lb (87.1 kg)     Vital signs reviewed  09/13/2024  - Note at rest 02 sats  94% on RA   General appearance:    mod obese (by BMI) wm nad/ very poor hearing even with hearing aid  HEENT : Oropharynx  clear/ mint in mouth   Nasal turbinates nl    NECK :  without  apparent JVD/ palpable Nodes/TM    LUNGS: no acc muscle use,  Mild barrel  contour chest wall with bilateral  Distant bs s audible wheeze and  without cough on insp or exp maneuvers  and mild  Hyperresonant  to  percussion bilaterally     CV:  RRR  no s3 or murmur or increase in P2, and no edema   ABD:  obese soft and nontender   MS:  walks with cane/ ext warm without deformities Or obvious joint restrictions  calf tenderness, cyanosis or clubbing     SKIN: warm and dry without lesions    NEURO:  alert, approp, nl sensorium with  no motor or cerebellar deficits apparent.     Assessment   Assessment & Plan COPD GOLD 3 Quit smoking cigs 06/2021/MM - 08/20/2021  After extensive coaching inhaler device,  effectiveness =  hfa   75% (short ti) > continue dpi trelegy with approp saba prn  - Spirometry 12/24/2021  FEV1 1.40 (43%)  Ratio 0.57 p trelegy prior with classic f/v loop   - Labs ordered 07/19/2022  :  allergy screen IgE 241  0.2    alpha one AT phenotype  MM  Level 149  - 07/19/2022   Walked on RA  x  3  lap(s) =  approx 450  ft  @ fast pace, stopped due to end of study s sob with lowest 02 sats 93%   - 01/27/2023   Walked on RA  x  3  lap(s) =  approx 450  ft  @ mod fast pace with cane stopped due to end of study  with lowest 02 sats 92% s sob  > d/c all 02 > did not do  -Chest LDSCT    04/29/23 Severe centrilobular and paraseptal emphysematous changes,  upper lung predominant - 01/27/2024  After extensive coaching inhaler device,  effectiveness =    70% (short Ti) try change to breztri  due to cough/hoarseness  on trelegy  - 01/27/2024   Walked on RA  x  1  lap(s) =  approx 150  ft  @ slow/awkward pace, stopped due to back/hip pain with lowest 02 sats 91%  - 03/09/2024  After extensive coaching inhaler device,  effectiveness =    75% hfa (short ti)   - 09/13/2024  After extensive coaching inhaler device,  effectiveness =    70% (trigger and breath not simultaneous )   Group E in terms of symptoms/risk so  laba/lama/ICS  therefore appropriate rx at this point >>>  breztri    and approp SABA prn.  Re SABA :  I spent extra time with pt today reviewing appropriate use of albuterol  for prn use on exertion with the following points: 1) saba is for relief of sob that does not improve by walking a slower pace or resting but rather if the pt does not improve after trying this first. 2) If the pt is convinced, as many are, that saba helps recover from activity faster then it's easy to tell if this is the case by re-challenging : ie stop, take the inhaler, then p 5 minutes try the exact same activity (intensity of workload) that just caused the symptoms and see if they are substantially diminished or not after saba 3) if there is an activity that reproducibly causes the symptoms, try the saba 15 min before the activity on alternate days   If in fact the saba really does help, then fine to continue to use it prn but advised may need to look closer at the maintenance regimen being used to achieve better control of airways disease with exertion.          Each maintenance medication was reviewed in detail including emphasizing most importantly the difference between maintenance and prns and under what circumstances the prns are to be triggered using an action plan format where appropriate.  Total time for H and P, chart review, counseling, reviewing hfa/ pulse ox  device(s) and generating customized AVS unique to this office visit / same day charting =           AVS  Patient Instructions  No mint menthol  or chocolate products   Work on inhaler technique:  relax and gently blow all the way out then take a nice smooth full deep breath back in, triggering the inhaler at same time you start breathing in.  Hold breath in for at least  5 seconds if you can. Blow out breztri  thru nose. Rinse and gargle with water  when done.  If mouth or throat bother you at all,  try brushing  teeth/gums/tongue with arm and hammer toothpaste/ make a slurry and gargle and spit out.   Please schedule a follow up visit in 12  months but call sooner if needed        Ozell America, MD 09/13/2024

## 2024-09-13 NOTE — Telephone Encounter (Signed)
 Patient is wanting B-12 he gets them months and last time was in July when he has his phy do I schedule B-12 nurse visit or make appt?

## 2024-09-13 NOTE — Assessment & Plan Note (Addendum)
 Quit smoking cigs 06/2021/MM - 08/20/2021  After extensive coaching inhaler device,  effectiveness =  hfa   75% (short ti) > continue dpi trelegy with approp saba prn  - Spirometry 12/24/2021  FEV1 1.40 (43%)  Ratio 0.57 p trelegy prior with classic f/v loop   - Labs ordered 07/19/2022  :  allergy screen IgE 241  0.2    alpha one AT phenotype  MM  Level 149  - 07/19/2022   Walked on RA  x  3  lap(s) =  approx 450  ft  @ fast pace, stopped due to end of study s sob with lowest 02 sats 93%   - 01/27/2023   Walked on RA  x  3  lap(s) =  approx 450  ft  @ mod fast pace with cane stopped due to end of study  with lowest 02 sats 92% s sob  > d/c all 02 > did not do  -Chest LDSCT    04/29/23 Severe centrilobular and paraseptal emphysematous changes, upper lung predominant - 01/27/2024  After extensive coaching inhaler device,  effectiveness =    70% (short Ti) try change to breztri  due to cough/hoarseness  on trelegy  - 01/27/2024   Walked on RA  x  1  lap(s) =  approx 150  ft  @ slow/awkward pace, stopped due to back/hip pain with lowest 02 sats 91%  - 03/09/2024  After extensive coaching inhaler device,  effectiveness =    75% hfa (short ti)   - 09/13/2024  After extensive coaching inhaler device,  effectiveness =    70% (trigger and breath not simultaneous )   Group E in terms of symptoms/risk so  laba/lama/ICS  therefore appropriate rx at this point >>>  breztri    and approp SABA prn.  Re SABA :  I spent extra time with pt today reviewing appropriate use of albuterol  for prn use on exertion with the following points: 1) saba is for relief of sob that does not improve by walking a slower pace or resting but rather if the pt does not improve after trying this first. 2) If the pt is convinced, as many are, that saba helps recover from activity faster then it's easy to tell if this is the case by re-challenging : ie stop, take the inhaler, then p 5 minutes try the exact same activity (intensity of workload) that  just caused the symptoms and see if they are substantially diminished or not after saba 3) if there is an activity that reproducibly causes the symptoms, try the saba 15 min before the activity on alternate days   If in fact the saba really does help, then fine to continue to use it prn but advised may need to look closer at the maintenance regimen being used to achieve better control of airways disease with exertion.          Each maintenance medication was reviewed in detail including emphasizing most importantly the difference between maintenance and prns and under what circumstances the prns are to be triggered using an action plan format where appropriate.  Total time for H and P, chart review, counseling, reviewing hfa/ pulse ox device(s) and generating customized AVS unique to this office visit / same day charting = 

## 2024-09-14 ENCOUNTER — Encounter: Payer: Self-pay | Admitting: *Deleted

## 2024-09-14 NOTE — Telephone Encounter (Signed)
 Patient notified via mychart

## 2024-09-17 ENCOUNTER — Ambulatory Visit

## 2024-11-29 ENCOUNTER — Ambulatory Visit
Admission: EM | Admit: 2024-11-29 | Discharge: 2024-11-29 | Disposition: A | Discharge status: Home / Self Care | Attending: Nurse Practitioner | Admitting: Nurse Practitioner

## 2024-11-29 ENCOUNTER — Ambulatory Visit: Payer: Self-pay

## 2024-11-29 DIAGNOSIS — J0100 Acute maxillary sinusitis, unspecified: Secondary | ICD-10-CM

## 2024-11-29 MED ORDER — DOXYCYCLINE HYCLATE 100 MG PO TABS: 100.0000 mg | ORAL_TABLET | Freq: Two times a day (BID) | ORAL | 0 refills | Status: AC

## 2024-11-29 NOTE — ED Triage Notes (Signed)
 Sinus congestion, sore throat, right ear, cough x 6 weeks. Tried sudafed, Flonase , saline spray.

## 2024-11-29 NOTE — Discharge Instructions (Signed)
 Take medication as directed. Increase fluids and get plenty of rest. You may take over-the-counter Tylenol  as needed for pain, fever, or general discomfort. Recommend normal saline nasal spray to help with nasal congestion throughout the day. For your cough, it may be helpful to use a humidifier at bedtime during sleep. If your symptoms fail to improve with this treatment, you may follow-up in this clinic or with your primary care physician for further evaluation. Follow-up as needed.

## 2024-11-29 NOTE — Telephone Encounter (Signed)
 FYI Only or Action Required?: FYI only for provider: appointment scheduled on 12/01/2024.  Patient was last seen in primary care on 07/26/2024 by Grooms, [place], [place].  Called Nurse Triage reporting Nasal Congestion and Otalgia.  Symptoms began several months ago.  Interventions attempted: OTC medications: sudafed, nasal sprays.  Symptoms are: gradually worsening.  Triage Disposition: See PCP When Office is Open (Within 3 Days)  Patient/caregiver understands and will follow disposition?: Yes  Copied from CRM #[id number]. Topic: Clinical - Red Word Triage >> Nov 29, 2024 11:26 AM Victoria B wrote: [facility] that prompted transfer to Nurse Triage: patient states has sinus infection, blood and mucus when he blows his nose, stuffiness sore ear Has copd, can't get oxygen  down his nose Reason for Disposition  [1] Nasal discharge AND [2] present > 10 days  Answer Assessment - Initial Assessment Questions 1. LOCATION: Where does it hurt?      Cheeks and top of teeth 2. ONSET: When did the sinus pain start?  (e.g., hours, days)      Seven weeks ago 3. SEVERITY: How bad is the pain?   (Scale 0-10; or none, mild, moderate or severe)     Varies from 2-5/10  5. NASAL CONGESTION: Is the nose blocked? If Yes, ask: Can you open it or must you breathe through your mouth?     Stopped up 6. NASAL DISCHARGE: Do you have discharge from your nose? If so ask, What color?     Yellow and bloody 7. FEVER: Do you have a fever? If Yes, ask: What is it, how was it measured, and when did it start?      denies 8. OTHER SYMPTOMS: Do you have any other symptoms? (e.g., sore throat, cough, earache, difficulty breathing)     Hoarse, sore throat, ear congestion  Protocols used: Sinus Pain or Congestion-A-AH

## 2024-11-29 NOTE — ED Provider Notes (Signed)
 " [facility]    CSN: [id number] Arrival date & time: 11/29/24  1219      History   Chief Complaint Chief Complaint  Patient presents with   Facial Pain   Sore Throat    HPI SANUEL [name] is a 73 y.o. male.   The history is provided by the patient.   Patient presents with a 6-week history of sinus congestion, intermittent sore throat, right ear Rusher, and cough.  Patient denies fever, chills, headache, ear drainage, wheezing, difficulty breathing, chest pain, abdominal pain, nausea, vomiting, diarrhea, or rash.  Patient reports underlying history of COPD and asthma.  States he has tried several over-the-counter medications to include Sudafed, Flonase , and saline nasal spray with minimal relief of his symptoms.  Past Medical History:  Diagnosis Date   Alcohol use    daily   Allergy    Anxiety    hx (05/05/2018)   Arthritis    all over (05/05/2018)   Asthma    Chronic bronchitis (HCC)    COPD (chronic obstructive pulmonary disease) (HCC)    Dyspnea    Family history of adverse reaction to anesthesia    mom would have nausea after OR    GERD (gastroesophageal reflux disease)    History of hiatal hernia    Hypertension    Pneumonia X 1   PONV (postoperative nausea and vomiting)    Seasonal allergies    spring, summer, fall   Sleep apnea     Patient Active Problem List   Diagnosis Date Noted   Hiatal hernia with gastroesophageal reflux 07/26/2024   Former cigarette smoker 01/27/2023   S/P total right hip arthroplasty 02/26/2022   Rhinitis, chronic 11/03/2021   COPD GOLD 3 08/20/2021   Primary osteoarthritis of hip 05/05/2018   Alcohol use 03/30/2018   Primary osteoarthritis of left hip 03/30/2018   Essential hypertension 03/30/2018    Past Surgical History:  Procedure Laterality Date   ANTERIOR CERVICAL DECOMP/DISCECTOMY FUSION Left    BACK SURGERY     COLONOSCOPY     JOINT REPLACEMENT     LAPAROSCOPIC CHOLECYSTECTOMY     LUMBAR DISC  SURGERY  X 2   L4-5   NASAL POLYP EXCISION  ~ 1970   SPINE SURGERY     TONSILLECTOMY     TOTAL HIP ARTHROPLASTY Left 05/05/2018   TOTAL HIP ARTHROPLASTY Left 05/05/2018   Procedure: LEFT TOTAL HIP ARTHROPLASTY ANTERIOR APPROACH;  Surgeon: Beverley Evalene JONETTA, MD;  Location: [facility] OR;  Service: Orthopedics;  Laterality: Left;   TOTAL HIP ARTHROPLASTY Right 02/26/2022   Procedure: TOTAL HIP ARTHROPLASTY ANTERIOR APPROACH;  Surgeon: Beverley Evalene JONETTA, MD;  Location: [facility];  Service: Orthopedics;  Laterality: Right;       Home Medications    Prior to Admission medications  Medication Sig Start Date End Date Taking? Authorizing Provider  albuterol  (VENTOLIN  HFA) 108 (90 Base) MCG/ACT inhaler Inhale into the lungs every 6 (six) hours as needed for wheezing or shortness of breath.   Yes [provider]  ALPRAZolam  (XANAX ) 0.5 MG tablet Take 0.25-0.5 mg by mouth daily.   Yes [provider]  amLODipine  (NORVASC ) 10 MG tablet Take 10 mg by mouth daily. 07/28/17  Yes [provider]  aspirin  EC 81 MG tablet Take 1 tablet (81 mg total) by mouth 2 (two) times daily. For DVT prophylaxis for 30 days after surgery. Patient taking differently: Take 81 mg by mouth once. 02/27/22  Yes Gawne, Meghan M,  PA-C  Cholecalciferol (VITAMIN D) 50 MCG (2000 UT) CAPS Take 4,000 Units by mouth daily.   Yes [provider]  diltiazem  (CARDIZEM  CD) 240 MG 24 hr capsule Take 240 mg by mouth daily. 01/22/24  Yes [provider]  doxycycline (VIBRA-TABS) 100 MG tablet Take 1 tablet (100 mg total) by mouth 2 (two) times daily for 7 days. 11/29/24 12/06/24 Yes Leath-Warren, Etta PARAS, NP  furosemide (LASIX) 20 MG tablet Take 20 mg by mouth daily as needed. 10/21/22  Yes [provider]  Glucosamine HCl (GLUCOSAMINE PO) Take 2 tablets by mouth daily.   Yes [provider]  olmesartan  (BENICAR ) 40 MG tablet TAKE ONE TABLET BY MOUTH ONCE DAILY. 02/05/23  Yes Darlean Ozell NOVAK,  MD  pantoprazole  (PROTONIX ) 40 MG tablet Take 40 mg by mouth daily. 07/10/21  Yes [provider]  potassium chloride  SA (KLOR-CON  M) 20 MEQ tablet Take 20 mEq by mouth as needed. 02/06/22  Yes [provider]  TURMERIC PO Take 1 capsule by mouth daily.   Yes [provider]  vitamin E 180 MG (400 UNITS) capsule Take 400 Units by mouth daily.   Yes [provider]  Budeson-Glycopyrrol-Formoterol (BREZTRI  AEROSPHERE) 160-9-4.8 MCG/ACT AERO Inhale 2 puffs into the lungs 2 (two) times daily. 01/27/24   Wert, Michael B, MD  cyanocobalamin (,VITAMIN B-12,) 1000 MCG/ML injection Inject 1,000 mcg into the muscle every 30 (thirty) days.    [provider]  levalbuterol BRUTUS HFA) 45 MCG/ACT inhaler Inhale 2 puffs into the lungs every 4 (four) hours. 06/29/24   [provider]  loperamide  (IMODIUM ) 2 MG capsule Take 1 capsule (2 mg total) by mouth 4 (four) times daily as needed for diarrhea or loose stools. 03/01/22   Patsey Lot, MD  meloxicam (MOBIC) 7.5 MG tablet Take 7.5 mg by mouth daily. 07/08/17   [provider]    Family History Family History  Problem Relation Age of Onset   Heart disease Mother    Heart disease Father     Social History Social History[1]   Allergies   Penicillins and Tape   Review of Systems Review of Systems Per HPI  Physical Exam Triage Vital Signs ED Triage Vitals  Encounter Vitals Group     BP 11/29/24 1315 (!) 157/96     Girls Systolic BP Percentile --      Girls Diastolic BP Percentile --      Boys Systolic BP Percentile --      Boys Diastolic BP Percentile --      Pulse Rate 11/29/24 1315 79     Resp 11/29/24 1315 18     Temp 11/29/24 1315 99 F (37.2 C)     Temp Source 11/29/24 1315 Oral     SpO2 11/29/24 1315 93 %     Weight --      Height --      Head Circumference --      Peak Flow --      Pain Score 11/29/24 1316 0     Pain Loc --      Pain Education --      Exclude from  Growth Chart --    No data found.  Updated Vital Signs BP (!) 157/96 (BP Location: Right Arm)   Pulse 79   Temp 99 F (37.2 C) (Oral)   Resp 18   SpO2 93%   Visual Acuity Right Eye Distance:   Left Eye Distance:   Bilateral Distance:  Right Eye Near:   Left Eye Near:    Bilateral Near:     Physical Exam Vitals and nursing note reviewed.  Constitutional:      General: He is not in acute distress.    Appearance: Normal appearance. He is well-developed.  HENT:     Head: Normocephalic and atraumatic.     Right Ear: Tympanic membrane, ear canal and external ear normal.     Left Ear: Tympanic membrane, ear canal and external ear normal.     Nose: Congestion present.     Right Turbinates: Enlarged and swollen.     Left Turbinates: Enlarged and swollen.     Right Sinus: Maxillary sinus tenderness present. No frontal sinus tenderness.     Left Sinus: No maxillary sinus tenderness or frontal sinus tenderness.     Mouth/Throat:     Lips: Pink.     Mouth: Mucous membranes are moist.     Pharynx: Uvula midline. Postnasal drip present. No pharyngeal swelling, oropharyngeal exudate, posterior oropharyngeal erythema or uvula swelling.     Comments: Cobblestoning present to posterior oropharynx  Eyes:     Extraocular Movements: Extraocular movements intact.     Conjunctiva/sclera: Conjunctivae normal.     Pupils: Pupils are equal, round, and reactive to light.  Neck:     Thyroid : No thyromegaly.     Trachea: No tracheal deviation.  Cardiovascular:     Rate and Rhythm: Normal rate and regular rhythm.     Pulses: Normal pulses.     Heart sounds: Normal heart sounds.  Pulmonary:     Effort: Pulmonary effort is normal. No respiratory distress.     Breath sounds: Normal breath sounds. No stridor. No wheezing, rhonchi or rales.  Abdominal:     General: Bowel sounds are normal.     Palpations: Abdomen is soft.     Tenderness: There is no abdominal tenderness.  Musculoskeletal:      Cervical back: Normal range of motion and neck supple.  Skin:    General: Skin is warm and dry.  Neurological:     General: No focal deficit present.     Mental Status: He is alert and oriented to person, place, and time.  Psychiatric:        Mood and Affect: Mood normal.        Behavior: Behavior normal.        Thought Content: Thought content normal.        Judgment: Judgment normal.      UC Treatments / Results  Labs (all labs ordered are listed, but only abnormal results are displayed) Labs Reviewed - No data to display  EKG   Radiology No results found.  Procedures Procedures (including critical care time)  Medications Ordered in UC Medications - No data to display  Initial Impression / Assessment and Plan / UC Course  I have reviewed the triage vital signs and the nursing notes.  Pertinent labs & imaging results that were available during my care of the patient were reviewed by me and considered in my medical decision making (see chart for details).  On exam, the patient's lung sounds are clear throughout, room air sats are at 93%.  He does have right maxillary sinus tenderness.  Symptoms are consistent with acute maxillary sinusitis given his ongoing symptoms.  Will treat with doxycycline 100 mg.  Patient advised to continue Flonase  and normal saline nasal spray.  Supportive care recommendations were provided and discussed with the patient to include  fluids, rest, over-the-counter Tylenol , and use of a humidifier during sleep.  Discussed indications with the patient regarding follow-up.  Patient was in agreement with this plan of care and verbalizes understanding.  All questions were answered.  Patient stable for discharge.  Final Clinical Impressions(s) / UC Diagnoses   Final diagnoses:  Acute maxillary sinusitis, recurrence not specified     Discharge Instructions      Take medication as directed. Increase fluids and get plenty of rest. You may take  over-the-counter Tylenol  as needed for pain, fever, or general discomfort. Recommend normal saline nasal spray to help with nasal congestion throughout the day. For your cough, it may be helpful to use a humidifier at bedtime during sleep. If your symptoms fail to improve with this treatment, you may follow-up in this clinic or with your primary care physician for further evaluation. Follow-up as needed.     ED Prescriptions     Medication Sig Dispense Auth. Provider   doxycycline (VIBRA-TABS) 100 MG tablet Take 1 tablet (100 mg total) by mouth 2 (two) times daily for 7 days. 14 tablet Leath-Warren, Etta PARAS, NP      PDMP not reviewed this encounter.     [1]  Social History Tobacco Use   Smoking status: Former    Current packs/day: 0.00    Average packs/day: 1 pack/day for 60.0 years (60.0 ttl pk-yrs)    Types: Cigarettes, Pipe    Start date: 06/13/1961    Quit date: 06/13/2021    Years since quitting: 3.4   Smokeless tobacco: Never  Vaping Use   Vaping status: Every Day  Substance Use Topics   Alcohol use: Yes    Alcohol/week: 3.0 standard drinks of alcohol    Types: 3 Shots of liquor per week    Comment: 3 drinks per day   Drug use: Never     Gilmer Etta PARAS, NP 11/29/24 1425  "

## 2024-12-01 ENCOUNTER — Ambulatory Visit

## 2025-01-26 ENCOUNTER — Ambulatory Visit: Admitting: Physician Assistant
